# Patient Record
Sex: Male | Born: 1985
Health system: Southern US, Community
[De-identification: ages and names within clinical notes are randomized; demographics above are authoritative.]

## PROBLEM LIST (undated history)

## (undated) DIAGNOSIS — G4730 Sleep apnea, unspecified: Secondary | ICD-10-CM

## (undated) DIAGNOSIS — F419 Anxiety disorder, unspecified: Secondary | ICD-10-CM

## (undated) DIAGNOSIS — G8929 Other chronic pain: Secondary | ICD-10-CM

## (undated) DIAGNOSIS — M1990 Unspecified osteoarthritis, unspecified site: Secondary | ICD-10-CM

## (undated) DIAGNOSIS — T4145XA Adverse effect of unspecified anesthetic, initial encounter: Secondary | ICD-10-CM

## (undated) DIAGNOSIS — R519 Headache, unspecified: Secondary | ICD-10-CM

## (undated) DIAGNOSIS — K219 Gastro-esophageal reflux disease without esophagitis: Secondary | ICD-10-CM

## (undated) DIAGNOSIS — E291 Testicular hypofunction: Secondary | ICD-10-CM

## (undated) DIAGNOSIS — F329 Major depressive disorder, single episode, unspecified: Secondary | ICD-10-CM

## (undated) DIAGNOSIS — M5412 Radiculopathy, cervical region: Secondary | ICD-10-CM

## (undated) DIAGNOSIS — T8859XA Other complications of anesthesia, initial encounter: Secondary | ICD-10-CM

## (undated) HISTORY — DX: Other chronic pain: G89.29

## (undated) HISTORY — PX: CARPAL TUNNEL RELEASE: SHX101

## (undated) HISTORY — PX: SURGERY SCROTAL / TESTICULAR: SUR1316

## (undated) HISTORY — DX: Major depressive disorder, single episode, unspecified: F32.9

## (undated) HISTORY — DX: Testicular hypofunction: E29.1

## (undated) HISTORY — PX: LATERAL EPICONDYLE RELEASE: SHX1958

## (undated) HISTORY — DX: Radiculopathy, cervical region: M54.12

## (undated) HISTORY — DX: Anxiety disorder, unspecified: F41.9

## (undated) HISTORY — DX: Headache, unspecified: R51.9

## (undated) HISTORY — DX: Gastro-esophageal reflux disease without esophagitis: K21.9

---

## 2015-12-29 ENCOUNTER — Ambulatory Visit (INDEPENDENT_AMBULATORY_CARE_PROVIDER_SITE_OTHER): Payer: BLUE CROSS/BLUE SHIELD

## 2015-12-29 ENCOUNTER — Ambulatory Visit (INDEPENDENT_AMBULATORY_CARE_PROVIDER_SITE_OTHER): Payer: BLUE CROSS/BLUE SHIELD | Admitting: Sports Medicine

## 2015-12-29 VITALS — BP 104/68 | HR 58 | Resp 16 | Wt 199.0 lb

## 2015-12-29 DIAGNOSIS — M79645 Pain in left finger(s): Secondary | ICD-10-CM

## 2015-12-29 DIAGNOSIS — M79671 Pain in right foot: Secondary | ICD-10-CM

## 2015-12-29 DIAGNOSIS — Z0000 Encounter for general adult medical examination without abnormal findings: Secondary | ICD-10-CM

## 2015-12-29 DIAGNOSIS — K219 Gastro-esophageal reflux disease without esophagitis: Secondary | ICD-10-CM | POA: Diagnosis not present

## 2015-12-29 DIAGNOSIS — M1990 Unspecified osteoarthritis, unspecified site: Secondary | ICD-10-CM | POA: Diagnosis not present

## 2015-12-29 DIAGNOSIS — M19079 Primary osteoarthritis, unspecified ankle and foot: Secondary | ICD-10-CM | POA: Insufficient documentation

## 2015-12-29 HISTORY — DX: Gastro-esophageal reflux disease without esophagitis: K21.9

## 2015-12-29 LAB — TESTOSTERONE: Testosterone: 246 ng/dL — ABNORMAL LOW (ref 250–827)

## 2015-12-29 LAB — CBC
HCT: 40.9 % (ref 38.5–50.0)
Hemoglobin: 14.2 g/dL (ref 13.2–17.1)
MCH: 30.8 pg (ref 27.0–33.0)
MCHC: 34.7 g/dL (ref 32.0–36.0)
MCV: 88.7 fL (ref 80.0–100.0)
MPV: 9.9 fL (ref 7.5–12.5)
Platelets: 262 10*3/uL (ref 140–400)
RBC: 4.61 MIL/uL (ref 4.20–5.80)
RDW: 13.7 % (ref 11.0–15.0)
WBC: 7.7 10*3/uL (ref 3.8–10.8)

## 2015-12-29 LAB — COMPREHENSIVE METABOLIC PANEL
ALT: 16 U/L (ref 9–46)
AST: 18 U/L (ref 10–40)
Albumin: 4.3 g/dL (ref 3.6–5.1)
Alkaline Phosphatase: 74 U/L (ref 40–115)
BUN: 18 mg/dL (ref 7–25)
CO2: 25 mmol/L (ref 20–31)
Calcium: 9 mg/dL (ref 8.6–10.3)
Chloride: 103 mmol/L (ref 98–110)
Creat: 1.21 mg/dL (ref 0.60–1.35)
Glucose, Bld: 74 mg/dL (ref 65–99)
Potassium: 4.4 mmol/L (ref 3.5–5.3)
Sodium: 139 mmol/L (ref 135–146)
Total Bilirubin: 0.4 mg/dL (ref 0.2–1.2)
Total Protein: 6.6 g/dL (ref 6.1–8.1)

## 2015-12-29 LAB — HEMOGLOBIN A1C
Hgb A1c MFr Bld: 5.3 % (ref <5.7)
Mean Plasma Glucose: 105 mg/dL

## 2015-12-29 LAB — LIPID PANEL
Cholesterol: 150 mg/dL (ref 125–200)
HDL: 36 mg/dL — ABNORMAL LOW (ref >=40)
LDL Cholesterol: 89 mg/dL (ref <130)
Total CHOL/HDL Ratio: 4.2 Ratio (ref <=5.0)
Triglycerides: 123 mg/dL (ref <150)
VLDL: 25 mg/dL (ref <30)

## 2015-12-29 LAB — TSH: TSH: 2.91 mIU/L (ref 0.40–4.50)

## 2015-12-29 MED ORDER — MELOXICAM 15 MG PO TABS: ORAL_TABLET | ORAL | Status: DC

## 2015-12-29 MED ORDER — PANTOPRAZOLE SODIUM 40 MG PO TBEC: 40.0000 mg | DELAYED_RELEASE_TABLET | Freq: Every day | ORAL | Status: DC

## 2015-12-29 MED ORDER — DICLOFENAC SODIUM 75 MG PO TBEC: 75.0000 mg | DELAYED_RELEASE_TABLET | Freq: Two times a day (BID) | ORAL | Status: DC
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
## 2015-12-29 NOTE — Assessment & Plan Note (Signed)
Checking routine blood work including testosterone

## 2015-12-29 NOTE — Assessment & Plan Note (Signed)
After a fracture decades ago, likely represents early OA. Adding meloxicam, x-rays, return to see me in 2 weeks, we will consider orthotics and injection if no better.
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
## 2015-12-29 NOTE — Assessment & Plan Note (Signed)
Adding pantoprazole, as previously on omeprazole.

## 2015-12-30 LAB — VITAMIN D 25 HYDROXY (VIT D DEFICIENCY, FRACTURES): Vit D, 25-Hydroxy: 35 ng/mL (ref 30–100)

## 2016-01-04 ENCOUNTER — Other Ambulatory Visit: Payer: Self-pay | Admitting: Sports Medicine

## 2016-01-04 DIAGNOSIS — R7989 Other specified abnormal findings of blood chemistry: Secondary | ICD-10-CM

## 2016-01-08 DIAGNOSIS — E291 Testicular hypofunction: Secondary | ICD-10-CM | POA: Diagnosis not present

## 2016-01-10 LAB — TESTOSTERONE TOTAL,FREE,BIO, MALES
Albumin: 4.9 g/dL (ref 3.6–5.1)
Sex Hormone Binding: 21 nmol/L (ref 10–50)
Testosterone, Bioavailable: 154 ng/dL (ref 130.5–681.7)
Testosterone, Free: 69.1 pg/mL (ref 47.0–244.0)
Testosterone: 388 ng/dL (ref 250–827)

## 2016-01-12 ENCOUNTER — Ambulatory Visit (INDEPENDENT_AMBULATORY_CARE_PROVIDER_SITE_OTHER): Payer: BLUE CROSS/BLUE SHIELD | Admitting: Sports Medicine

## 2016-01-12 VITALS — BP 111/68 | HR 65 | Resp 18 | Wt 196.3 lb

## 2016-01-12 DIAGNOSIS — M1990 Unspecified osteoarthritis, unspecified site: Secondary | ICD-10-CM

## 2016-01-12 DIAGNOSIS — M79645 Pain in left finger(s): Secondary | ICD-10-CM

## 2016-01-12 DIAGNOSIS — K219 Gastro-esophageal reflux disease without esophagitis: Secondary | ICD-10-CM

## 2016-01-12 DIAGNOSIS — E291 Testicular hypofunction: Secondary | ICD-10-CM | POA: Diagnosis not present

## 2016-01-12 DIAGNOSIS — M19079 Primary osteoarthritis, unspecified ankle and foot: Secondary | ICD-10-CM

## 2016-01-12 HISTORY — DX: Testicular hypofunction: E29.1

## 2016-01-12 MED ORDER — TESTOSTERONE CYPIONATE 200 MG/ML IM SOLN
200.0000 mg | Freq: Once | INTRAMUSCULAR | Status: AC
  Administered 2016-01-12: 200 mg via INTRAMUSCULAR

## 2016-01-12 MED ORDER — NAPROXEN-ESOMEPRAZOLE 500-20 MG PO TBEC: 1.0000 | DELAYED_RELEASE_TABLET | Freq: Two times a day (BID) | ORAL | Status: DC

## 2016-01-12 NOTE — Assessment & Plan Note (Signed)
Starting testosterone injections, risks, benefits, alternatives explained. He will come in for his second injection in 2 weeks, and then we will recheck testosterone levels 1 week after that, if symptoms have improved we will continue, if not, we will discontinue.

## 2016-01-12 NOTE — Assessment & Plan Note (Signed)
Improved we will probably see a better improvement with Vimovo

## 2016-01-12 NOTE — Progress Notes (Signed)
  Subjective:    CC: Follow-up  HPI: Hypogonadism: Desires to start testosterone supplementation, does not desire to have anymore children.  Left thumb pain: Likely a sprain, did well with a thumb spica brace. Still has a bit of pain.  Right first MTP arthritis: Improved with anti-inflammatories, unfortunately had to stop due to gastritis.  Dyspepsia/gastritis: Did not get as good a response with pantoprazole as he did with omeprazole.  Past medical history, Surgical history, Family history not pertinant except as noted below, Social history, Allergies, and medications have been entered into the medical record, reviewed, and no changes needed.   Review of Systems: No fevers, chills, night sweats, weight loss, chest pain, or shortness of breath.   Objective:    General: Well Developed, well nourished, and in no acute distress.  Neuro: Alert and oriented x3, extra-ocular muscles intact, sensation grossly intact.  HEENT: Normocephalic, atraumatic, pupils equal round reactive to light, neck supple, no masses, no lymphadenopathy, thyroid nonpalpable.  Skin: Warm and dry, no rashes. Cardiac: Regular rate and rhythm, no murmurs rubs or gallops, no lower extremity edema.  Respiratory: Clear to auscultation bilaterally. Not using accessory muscles, speaking in full sentences.  Impression and Recommendations:   I spent 25 minutes with this patient, greater than 50% was face-to-face time counseling regarding the above diagnoses

## 2016-01-12 NOTE — Addendum Note (Signed)
Addended by: Baird KayUGLAS, Takeyah Wieman M on: 01/12/2016 11:32 AM   Modules accepted: Orders

## 2016-01-12 NOTE — Patient Instructions (Signed)
Needs to have labs checked 1 week after second testosterone injection

## 2016-01-12 NOTE — Assessment & Plan Note (Signed)
Continues to improve, wear the brace for another 2 weeks and then discontinue.

## 2016-01-12 NOTE — Assessment & Plan Note (Signed)
Better response with omeprazole, did not do as well with protonix. He will get an acid blocker in his combination oral anti-inflammatory.  Also getting some dyspepsia with meloxicam.

## 2016-01-26 ENCOUNTER — Ambulatory Visit (INDEPENDENT_AMBULATORY_CARE_PROVIDER_SITE_OTHER): Payer: BLUE CROSS/BLUE SHIELD | Admitting: Sports Medicine

## 2016-01-26 VITALS — BP 113/71 | HR 74 | Wt 201.0 lb

## 2016-01-26 DIAGNOSIS — E291 Testicular hypofunction: Secondary | ICD-10-CM

## 2016-01-26 MED ORDER — TESTOSTERONE CYPIONATE 200 MG/ML IM SOLN
200.0000 mg | Freq: Once | INTRAMUSCULAR | Status: AC
Start: 2016-01-26 — End: 2016-01-26
  Administered 2016-01-26: 200 mg via INTRAMUSCULAR

## 2016-01-26 NOTE — Progress Notes (Signed)
Patient came into office today for testosterone injection. Denies chest pain, shortness of breath, headaches and problems associated with taking this medication. Patient states he has had no abnornal mood swings. Patient tolerated injection in ROUQ well without complications. Patient advised to schedule his next injection for 2 weeks from today. 

## 2016-02-05 DIAGNOSIS — E291 Testicular hypofunction: Secondary | ICD-10-CM | POA: Diagnosis not present

## 2016-02-06 LAB — CBC
HCT: 42.9 % (ref 38.5–50.0)
Hemoglobin: 14.2 g/dL (ref 13.2–17.1)
MCH: 30.1 pg (ref 27.0–33.0)
MCHC: 33.1 g/dL (ref 32.0–36.0)
MCV: 90.9 fL (ref 80.0–100.0)
MPV: 10.1 fL (ref 7.5–12.5)
Platelets: 255 10*3/uL (ref 140–400)
RBC: 4.72 MIL/uL (ref 4.20–5.80)
RDW: 13.6 % (ref 11.0–15.0)
WBC: 7.5 10*3/uL (ref 3.8–10.8)

## 2016-02-06 LAB — PSA, TOTAL AND FREE
PSA, Free Pct: 51 % (ref >25)
PSA, Free: 0.38 ng/mL
PSA: 0.74 ng/mL (ref <=4.00)

## 2016-02-06 LAB — TESTOSTERONE: Testosterone: 258 ng/dL (ref 250–827)

## 2016-02-06 NOTE — Addendum Note (Signed)
Addended by: Monica Becton on: 02/06/2016 12:08 PM   Modules accepted: Orders

## 2016-02-09 ENCOUNTER — Other Ambulatory Visit: Payer: Self-pay | Admitting: *Deleted

## 2016-02-09 ENCOUNTER — Ambulatory Visit (INDEPENDENT_AMBULATORY_CARE_PROVIDER_SITE_OTHER): Payer: BLUE CROSS/BLUE SHIELD | Admitting: Sports Medicine

## 2016-02-09 VITALS — BP 113/59 | HR 60 | Wt 197.0 lb

## 2016-02-09 DIAGNOSIS — E291 Testicular hypofunction: Secondary | ICD-10-CM

## 2016-02-09 MED ORDER — TESTOSTERONE CYPIONATE 200 MG/ML IM SOLN
200.0000 mg | INTRAMUSCULAR | Status: DC
  Administered 2016-02-09: 200 mg via INTRAMUSCULAR

## 2016-02-09 NOTE — Progress Notes (Signed)
Patient is here for testosterone injection. Patient denies no headache, blurred vision,mood changes. Patient does reports feeling more energetic.He pleased with response that he is having to the testosterone injections. Patient is aware to go to lab one week from today. Re entered labs so that bar code will be on the order and gave to the patient.

## 2016-02-16 DIAGNOSIS — E291 Testicular hypofunction: Secondary | ICD-10-CM | POA: Diagnosis not present

## 2016-02-19 LAB — TESTOSTERONE TOTAL,FREE,BIO, MALES
Albumin: 4.2 g/dL (ref 3.6–5.1)
Sex Hormone Binding: 18 nmol/L (ref 10–50)
Testosterone, Bioavailable: 227.4 ng/dL (ref 130.5–681.7)
Testosterone, Free: 118.1 pg/mL (ref 47.0–244.0)
Testosterone: 529 ng/dL (ref 250–827)

## 2016-02-20 MED ORDER — OMEPRAZOLE 40 MG PO CPDR: 40.0000 mg | DELAYED_RELEASE_CAPSULE | Freq: Every day | ORAL | 3 refills | Status: DC

## 2016-02-20 MED ORDER — TESTOSTERONE CYPIONATE 200 MG/ML IM SOLN: 200.0000 mg | INTRAMUSCULAR | 0 refills | Status: DC

## 2016-02-20 NOTE — Addendum Note (Signed)
Addended by: Monica BectonHEKKEKANDAM, Chelsey Redondo J on: 02/20/2016 04:44 PM   Modules accepted: Orders

## 2016-02-22 ENCOUNTER — Telehealth: Payer: Self-pay | Admitting: *Deleted

## 2016-02-22 NOTE — Telephone Encounter (Signed)
Form completed and faxed to [employer].

## 2016-02-22 NOTE — Telephone Encounter (Signed)
Tried to initiate PA through covermy meds, unable to upload lab results, ins co. Is faxing forms to the office

## 2016-02-23 ENCOUNTER — Ambulatory Visit (INDEPENDENT_AMBULATORY_CARE_PROVIDER_SITE_OTHER): Payer: BLUE CROSS/BLUE SHIELD | Admitting: Sports Medicine

## 2016-02-23 VITALS — BP 126/66 | HR 76 | Wt 201.0 lb

## 2016-02-23 DIAGNOSIS — E291 Testicular hypofunction: Secondary | ICD-10-CM | POA: Diagnosis not present

## 2016-02-23 MED ORDER — TESTOSTERONE CYPIONATE 200 MG/ML IM SOLN
200.0000 mg | Freq: Once | INTRAMUSCULAR | Status: AC
  Administered 2016-02-23: 200 mg via INTRAMUSCULAR

## 2016-02-23 NOTE — Progress Notes (Signed)
   Subjective:    Patient ID: Shaun Camacho, male    DOB: 1985/11/22, 30 y.o.   MRN: [id number]  HPI  Shaun Camacho is here for a testosterone injection. Denies chest pain, shortness of breath, headaches or mood changes.    Review of Systems     Objective:   Physical Exam        Assessment & Plan:  Patient tolerated injection well without complications. Patient advised to schedule next injection 14 days from today.

## 2016-02-28 NOTE — Telephone Encounter (Signed)
PA approved for Depo-testosterone from 02/22/2016 through 07/07/2038.  Pt and pharm aware

## 2016-03-14 ENCOUNTER — Telehealth: Payer: Self-pay

## 2016-03-14 NOTE — Telephone Encounter (Signed)
Pt called c/o of numbness in his fingers, as well as swelling in hands, wrists and ankles. Pt states he started noticing these symptoms when he started his testosterone and would like to know if that could be the cause. Please advise.

## 2016-03-14 NOTE — Telephone Encounter (Signed)
It is not the testosterone.

## 2016-03-15 NOTE — Telephone Encounter (Signed)
Pt notified will make an appointment to have symptoms checked.

## 2016-03-22 ENCOUNTER — Ambulatory Visit (INDEPENDENT_AMBULATORY_CARE_PROVIDER_SITE_OTHER): Payer: BLUE CROSS/BLUE SHIELD | Admitting: Sports Medicine

## 2016-03-22 DIAGNOSIS — R200 Anesthesia of skin: Secondary | ICD-10-CM

## 2016-03-22 DIAGNOSIS — R208 Other disturbances of skin sensation: Secondary | ICD-10-CM

## 2016-03-22 DIAGNOSIS — Z981 Arthrodesis status: Secondary | ICD-10-CM | POA: Insufficient documentation

## 2016-03-22 DIAGNOSIS — M5412 Radiculopathy, cervical region: Secondary | ICD-10-CM

## 2016-03-22 DIAGNOSIS — M25512 Pain in left shoulder: Secondary | ICD-10-CM

## 2016-03-22 HISTORY — DX: Radiculopathy, cervical region: M54.12

## 2016-03-22 NOTE — Assessment & Plan Note (Signed)
Mild rotator cuff impingement type symptoms. Rehabilitation exercises and exercise band given, return in one month.

## 2016-03-22 NOTE — Progress Notes (Signed)
Pt supplied testosterone for 2 week injection. Injection placed in RUOQ. Pt tolerated well no c/o headaches, nausea or dizziness.

## 2016-03-22 NOTE — Assessment & Plan Note (Signed)
Suspect carpal tunnel syndrome. Bilateral nighttime splinting. Return to see me in one month, hydrodissection if no better.

## 2016-03-22 NOTE — Progress Notes (Signed)
  Subjective:    CC: Hand numbness  HPI: For several weeks this pleasant 30 year old male has had increasing sensation of fullness and numbness in both hands, across the fingertips, worse at night and in the mornings. Moderate, persistent. No changes in medications, no trauma. He also had the subjective sensation of numbness and tingling in this feet as well as swelling. Recent hemoglobin A1c was normal. No orthopnea, chest pain, no PND.  He also complains of pain in his left shoulder popping while working out.  Past medical history, Surgical history, Family history not pertinant except as noted below, Social history, Allergies, and medications have been entered into the medical record, reviewed, and no changes needed.   Review of Systems: No fevers, chills, night sweats, weight loss, chest pain, or shortness of breath.   Objective:    General: Well Developed, well nourished, and in no acute distress.  Neuro: Alert and oriented x3, extra-ocular muscles intact, sensation grossly intact.  HEENT: Normocephalic, atraumatic, pupils equal round reactive to light, neck supple, no masses, no lymphadenopathy, thyroid nonpalpable.  Skin: Warm and dry, no rashes. Cardiac: Regular rate and rhythm, no murmurs rubs or gallops, no lower extremity edema.  Respiratory: Clear to auscultation bilaterally. Not using accessory muscles, speaking in full sentences. Bilateral Wrist: Inspection normal with no visible erythema or swelling. ROM smooth and normal with good flexion and extension and ulnar/radial deviation that is symmetrical with opposite wrist. Palpation is normal over metacarpals, navicular, lunate, and TFCC; tendons without tenderness/ swelling No snuffbox tenderness. No tenderness over Canal of Guyon. Strength 5/5 in all directions without pain. Negative Finkelstein, Phalen sign, positive Tinel's sign bilaterally Negative Watson's test. Left Shoulder: Inspection reveals no abnormalities,  atrophy or asymmetry. Palpation is normal with no tenderness over AC joint or bicipital groove. ROM is full in all planes. Rotator cuff strength normal throughout. Mildly positive Neer and Hawkin's tests, empty can. Speeds and Yergason's tests normal. No labral pathology noted with negative Obrien's, negative crank, negative clunk, and good stability. Normal scapular function observed. No painful arc and no drop arm sign. No apprehension sign  Impression and Recommendations:    Bilateral hand numbness Suspect carpal tunnel syndrome. Bilateral nighttime splinting. Return to see me in one month, hydrodissection if no better.  Left shoulder pain Mild rotator cuff impingement type symptoms. Rehabilitation exercises and exercise band given, return in one month.

## 2016-04-19 ENCOUNTER — Ambulatory Visit: Payer: BLUE CROSS/BLUE SHIELD | Admitting: Sports Medicine

## 2016-06-26 ENCOUNTER — Ambulatory Visit (INDEPENDENT_AMBULATORY_CARE_PROVIDER_SITE_OTHER): Payer: BLUE CROSS/BLUE SHIELD | Admitting: Family Medicine

## 2016-06-26 ENCOUNTER — Encounter: Payer: Self-pay | Admitting: Family Medicine

## 2016-06-26 VITALS — BP 123/64 | HR 58 | Temp 98.1°F

## 2016-06-26 DIAGNOSIS — L0231 Cutaneous abscess of buttock: Secondary | ICD-10-CM | POA: Diagnosis not present

## 2016-06-26 NOTE — Patient Instructions (Signed)
Thank you for coming in today. Return as needed.    Incision and Drainage, Care After Refer to this sheet in the next few weeks. These instructions provide you with information about caring for yourself after your procedure. Your health care provider may also give you more specific instructions. Your treatment has been planned according to current medical practices, but problems sometimes occur. Call your health care provider if you have any problems or questions after your procedure. What can I expect after the procedure? After the procedure, it is common to have:  Pain or discomfort around your incision site.  Drainage from your incision. Follow these instructions at home:  Take over-the-counter and prescription medicines only as told by your health care provider.  If you were prescribed an antibiotic medicine, take it as told by your health care provider.Do not stop taking the antibiotic even if you start to feel better.  Followinstructions from your health care provider about:  How to take care of your incision.  When and how you should change your packing and bandage (dressing). Wash your hands with soap and water before you change your dressing. If soap and water are not available, use hand sanitizer.  When you should remove your dressing.  Do not take baths, swim, or use a hot tub until your health care provider approves.  Keep all follow-up visits as told by your health care provider. This is important.  Check your incision area every day for signs of infection. Check for:  More redness, swelling, or pain.  More fluid or blood.  Warmth.  Pus or a bad smell. Contact a health care provider if:  Your cyst or abscess returns.  You have a fever.  You have more redness, swelling, or pain around your incision.  You have more fluid or blood coming from your incision.  Your incision feels warm to the touch.  You have pus or a bad smell coming from your  incision. Get help right away if:  You have severe pain or bleeding.  You cannot eat or drink without vomiting.  You have decreased urine output.  You become short of breath.  You have chest pain.  You cough up blood.  The area where the incision and drainage occurred becomes numb or it tingles. This information is not intended to replace advice given to you by your health care provider. Make sure you discuss any questions you have with your health care provider. Document Released: 09/16/2011 Document Revised: 11/24/2015 Document Reviewed: 04/14/2015 Elsevier Interactive Patient Education  2017 [employer]

## 2016-06-26 NOTE — Progress Notes (Signed)
       Shaun Camacho is a 30 y.o. male who [facility] today for abscess right buttocks. Symptoms present for about a week. Patient has had history of recurrent abscesses in this area and has been draining at home with a needle. He notes he continues to become red painful swollen and occasionally oozes pus. He denies any fevers or chills nausea vomiting or diarrhea.   No past medical history on file. Past Surgical History:  Procedure Laterality Date  . SURGERY SCROTAL / TESTICULAR     Social History  Substance Use Topics  . Smoking status: Current Every Day Smoker    Packs/day: 0.50    Years: 18.00  . Smokeless tobacco: Current User    Types: Chew  . Alcohol use Not on file     Comment: rare   family history includes Diabetes in his maternal grandmother; Thyroid disease in his father and sister.  ROS as above:  Medications: Current Outpatient Prescriptions  Medication Sig Dispense Refill  . Naproxen-Esomeprazole 500-20 MG TBEC Take 1 tablet by mouth 2 (two) times daily. 60 tablet 3  . omeprazole (PRILOSEC) 40 MG capsule Take 1 capsule (40 mg total) by mouth daily. 90 capsule 3  . testosterone cypionate (DEPOTESTOSTERONE CYPIONATE) 200 MG/ML injection Inject 1 mL (200 mg total) into the muscle every 14 (fourteen) days. Please include 22 and 18 G needles as well as syringes 10 mL 0   Current Facility-Administered Medications  Medication Dose Route Frequency Provider Last Rate Last Dose  . testosterone cypionate (DEPOTESTOSTERONE CYPIONATE) injection 200 mg  200 mg Intramuscular Q14 Days Monica Bectonhomas J Thekkekandam, MD   200 mg at 02/09/16 1036   Allergies  Allergen Reactions  . Tramadol Palpitations  . Meloxicam Other (See Comments)    Somnolence    Health Maintenance Health Maintenance  Topic Date Due  . HIV Screening  10/21/1998  . TETANUS/TDAP   10/21/2002  . INFLUENZA VACCINE  02/06/2016     Exam:  BP 123/64   Pulse (!) 58   Temp 98.1 F (36.7 C) (Oral)  Gen: Well NAD Skin: Right buttocks: Small erythematous area approximately 2 cm across at the right buttocks near the gluteal cleft. The abscess is not located near the anus. The central portion of the abscess is fluctuant in the surrounding area is erythematous and indurated and tender. No pus is expressible.  Abscess incision and drainage. Consent obtained and timeout performed. Skin cleaned with alcohol, and cold spray applied. 2 mL of lidocaine  injected achieving good anesthesia. Skin was again cleaned with alcohol. A sharp incision was made to the area of fluctuance. The incision was widened and pus was expressed. Blunt dissection was used to break up loculations. Further pus was expressed. Patient tolerated the procedure well. A dressing was applied   No results found for this or any previous visit (from the past 72 hour(s)). No results found.    Assessment and Plan: 30 y.o. male with abscess buttocks. This is not located near the pilonidal area. Incised and drained today. Return as needed.   No orders of the defined types were placed in this encounter.   Discussed warning signs or symptoms. Please see discharge instructions. Patient expresses understanding.

## 2016-07-21 ENCOUNTER — Other Ambulatory Visit: Payer: Self-pay | Admitting: Sports Medicine

## 2016-07-21 DIAGNOSIS — E291 Testicular hypofunction: Secondary | ICD-10-CM

## 2016-07-31 DIAGNOSIS — F4323 Adjustment disorder with mixed anxiety and depressed mood: Secondary | ICD-10-CM | POA: Diagnosis not present

## 2016-08-09 ENCOUNTER — Ambulatory Visit: Payer: BLUE CROSS/BLUE SHIELD | Admitting: Sports Medicine

## 2016-08-15 DIAGNOSIS — F411 Generalized anxiety disorder: Secondary | ICD-10-CM | POA: Diagnosis not present

## 2016-08-22 DIAGNOSIS — M25521 Pain in right elbow: Secondary | ICD-10-CM | POA: Diagnosis not present

## 2016-08-23 ENCOUNTER — Ambulatory Visit (INDEPENDENT_AMBULATORY_CARE_PROVIDER_SITE_OTHER): Payer: BLUE CROSS/BLUE SHIELD | Admitting: Sports Medicine

## 2016-08-23 ENCOUNTER — Encounter: Payer: Self-pay | Admitting: Sports Medicine

## 2016-08-23 DIAGNOSIS — M7711 Lateral epicondylitis, right elbow: Secondary | ICD-10-CM | POA: Insufficient documentation

## 2016-08-23 DIAGNOSIS — F32A Depression, unspecified: Secondary | ICD-10-CM | POA: Insufficient documentation

## 2016-08-23 DIAGNOSIS — F418 Other specified anxiety disorders: Secondary | ICD-10-CM | POA: Diagnosis not present

## 2016-08-23 DIAGNOSIS — F419 Anxiety disorder, unspecified: Secondary | ICD-10-CM

## 2016-08-23 DIAGNOSIS — F329 Major depressive disorder, single episode, unspecified: Secondary | ICD-10-CM | POA: Insufficient documentation

## 2016-08-23 HISTORY — DX: Depression, unspecified: F32.A

## 2016-08-23 HISTORY — DX: Anxiety disorder, unspecified: F41.9

## 2016-08-23 MED ORDER — DICLOFENAC SODIUM 75 MG PO TBEC: 75.0000 mg | DELAYED_RELEASE_TABLET | Freq: Two times a day (BID) | ORAL | 3 refills | Status: DC

## 2016-08-23 MED ORDER — ESCITALOPRAM OXALATE 5 MG PO TABS: 5.0000 mg | ORAL_TABLET | Freq: Every day | ORAL | 3 refills | Status: DC

## 2016-08-23 NOTE — Assessment & Plan Note (Signed)
Continue strap, adding Voltaren, rehabilitation exercises. I would like baseline x-rays. Return in one month. Injection if no better.

## 2016-08-23 NOTE — Assessment & Plan Note (Signed)
Lexapro.  Return in 4 weeks for a PHQ9 and GAD7 again.
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
## 2016-08-28 ENCOUNTER — Telehealth: Payer: Self-pay

## 2016-08-28 NOTE — Telephone Encounter (Signed)
Pt left VM stating medication is causing him to have issues and you told him to call if this occurs. Would like to know what's the next option. Please advise.

## 2016-08-29 MED ORDER — VORTIOXETINE HBR 10 MG PO TABS: 1.0000 | ORAL_TABLET | Freq: Every day | ORAL | 3 refills | Status: DC

## 2016-08-29 NOTE — Telephone Encounter (Signed)
Okay, I will switch him to Trintellix.  Rx and discount coupon in box.

## 2016-08-29 NOTE — Telephone Encounter (Signed)
Pt.notified

## 2016-09-03 DIAGNOSIS — F411 Generalized anxiety disorder: Secondary | ICD-10-CM | POA: Diagnosis not present

## 2016-09-12 ENCOUNTER — Ambulatory Visit (INDEPENDENT_AMBULATORY_CARE_PROVIDER_SITE_OTHER): Payer: BLUE CROSS/BLUE SHIELD | Admitting: Sports Medicine

## 2016-09-12 ENCOUNTER — Encounter: Payer: Self-pay | Admitting: Sports Medicine

## 2016-09-12 DIAGNOSIS — F418 Other specified anxiety disorders: Secondary | ICD-10-CM

## 2016-09-12 DIAGNOSIS — F419 Anxiety disorder, unspecified: Secondary | ICD-10-CM

## 2016-09-12 DIAGNOSIS — F32A Depression, unspecified: Secondary | ICD-10-CM

## 2016-09-12 DIAGNOSIS — F329 Major depressive disorder, single episode, unspecified: Secondary | ICD-10-CM

## 2016-09-12 MED ORDER — VILAZODONE HCL 10 MG PO TABS: 10.0000 mg | ORAL_TABLET | Freq: Every day | ORAL | 3 refills | Status: DC

## 2016-09-12 NOTE — Progress Notes (Signed)
  Subjective:    CC: follow-up  HPI: Anxiety and depression: Good response to Trintellix however is developing erectile dysfunction unfortunately.  Lateral epicondylitis: Improving with rehabilitation exercises.  Past medical history:  Negative.  See flowsheet/record as well for more information.  Surgical history: Negative.  See flowsheet/record as well for more information.  Family history: Negative.  See flowsheet/record as well for more information.  Social history: Negative.  See flowsheet/record as well for more information.  Allergies, and medications have been entered into the medical record, reviewed, and no changes needed.   Review of Systems: No fevers, chills, night sweats, weight loss, chest pain, or shortness of breath.   Objective:    General: Well Developed, well nourished, and in no acute distress.  Neuro: Alert and oriented x3, extra-ocular muscles intact, sensation grossly intact.  HEENT: Normocephalic, atraumatic, pupils equal round reactive to light, neck supple, no masses, no lymphadenopathy, thyroid nonpalpable.  Skin: Warm and dry, no rashes. Cardiac: Regular rate and rhythm, no murmurs rubs or gallops, no lower extremity edema.  Respiratory: Clear to auscultation bilaterally. Not using accessory muscles, speaking in full sentences.  Impression and Recommendations:    Anxiety and depression Good improvement in symptoms however is getting sexual dysfunction with difficulty achieving erection, he has no problem achieving ejaculation. Switching to Viibryd.  I spent 25 minutes with this patient, greater than 50% was face-to-face time counseling regarding the above diagnoses

## 2016-09-12 NOTE — Assessment & Plan Note (Signed)
Good improvement in symptoms however is getting sexual dysfunction with difficulty achieving erection, he has no problem achieving ejaculation. Switching to Viibryd.

## 2016-09-20 ENCOUNTER — Ambulatory Visit: Payer: BLUE CROSS/BLUE SHIELD | Admitting: Sports Medicine

## 2016-09-25 ENCOUNTER — Telehealth: Payer: Self-pay

## 2016-09-25 MED ORDER — OSELTAMIVIR PHOSPHATE 75 MG PO CAPS: 75.0000 mg | ORAL_CAPSULE | Freq: Two times a day (BID) | ORAL | 0 refills | Status: DC

## 2016-09-25 NOTE — Telephone Encounter (Signed)
Pt left VM stating his wife and son recently had the flu and he's having symptoms. Would like to know if he can get tamiflu. Please advise.

## 2016-09-25 NOTE — Telephone Encounter (Signed)
Tamiflu called in

## 2016-10-01 DIAGNOSIS — F411 Generalized anxiety disorder: Secondary | ICD-10-CM | POA: Diagnosis not present

## 2016-10-10 ENCOUNTER — Ambulatory Visit: Payer: BLUE CROSS/BLUE SHIELD | Admitting: Sports Medicine

## 2016-10-10 DIAGNOSIS — F411 Generalized anxiety disorder: Secondary | ICD-10-CM | POA: Diagnosis not present

## 2016-10-11 ENCOUNTER — Ambulatory Visit (INDEPENDENT_AMBULATORY_CARE_PROVIDER_SITE_OTHER): Payer: BLUE CROSS/BLUE SHIELD | Admitting: Sports Medicine

## 2016-10-11 DIAGNOSIS — F419 Anxiety disorder, unspecified: Secondary | ICD-10-CM

## 2016-10-11 DIAGNOSIS — M7711 Lateral epicondylitis, right elbow: Secondary | ICD-10-CM | POA: Diagnosis not present

## 2016-10-11 DIAGNOSIS — F329 Major depressive disorder, single episode, unspecified: Secondary | ICD-10-CM

## 2016-10-11 DIAGNOSIS — F418 Other specified anxiety disorders: Secondary | ICD-10-CM

## 2016-10-11 DIAGNOSIS — F32A Depression, unspecified: Secondary | ICD-10-CM

## 2016-10-11 NOTE — Assessment & Plan Note (Signed)
Fantastic response to Viibryd, no sexual dysfunction in all symptoms have resolved. Return in 3 months.
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
## 2016-10-22 DIAGNOSIS — F411 Generalized anxiety disorder: Secondary | ICD-10-CM | POA: Diagnosis not present

## 2016-11-18 ENCOUNTER — Other Ambulatory Visit: Payer: Self-pay | Admitting: Sports Medicine

## 2016-11-21 DIAGNOSIS — F411 Generalized anxiety disorder: Secondary | ICD-10-CM | POA: Diagnosis not present

## 2016-12-05 DIAGNOSIS — F411 Generalized anxiety disorder: Secondary | ICD-10-CM | POA: Diagnosis not present

## 2016-12-11 ENCOUNTER — Other Ambulatory Visit: Payer: Self-pay | Admitting: Sports Medicine

## 2016-12-11 DIAGNOSIS — E291 Testicular hypofunction: Secondary | ICD-10-CM

## 2016-12-19 DIAGNOSIS — F411 Generalized anxiety disorder: Secondary | ICD-10-CM | POA: Diagnosis not present

## 2016-12-27 ENCOUNTER — Ambulatory Visit (INDEPENDENT_AMBULATORY_CARE_PROVIDER_SITE_OTHER): Payer: BLUE CROSS/BLUE SHIELD | Admitting: Sports Medicine

## 2016-12-27 DIAGNOSIS — F329 Major depressive disorder, single episode, unspecified: Secondary | ICD-10-CM

## 2016-12-27 DIAGNOSIS — F419 Anxiety disorder, unspecified: Secondary | ICD-10-CM | POA: Diagnosis not present

## 2016-12-27 DIAGNOSIS — F32A Depression, unspecified: Secondary | ICD-10-CM

## 2016-12-27 MED ORDER — VILAZODONE HCL 20 MG PO TABS: 1.0000 | ORAL_TABLET | Freq: Every day | ORAL | 3 refills | Status: DC

## 2016-12-27 NOTE — Assessment & Plan Note (Signed)
Overall doing well on 10 mg of Viibryd. He did take a new job and is on [facility] [employer]. There is a lot more time he with this job, and this is brought on a new degree of anxiety, we are going to increase Viibryd to 20 mg for a couple of months until things level out and then drop back down to 10 mg.
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
## 2017-01-16 DIAGNOSIS — F411 Generalized anxiety disorder: Secondary | ICD-10-CM | POA: Diagnosis not present

## 2017-01-24 ENCOUNTER — Ambulatory Visit (INDEPENDENT_AMBULATORY_CARE_PROVIDER_SITE_OTHER): Payer: BLUE CROSS/BLUE SHIELD | Admitting: Sports Medicine

## 2017-01-24 ENCOUNTER — Encounter: Payer: Self-pay | Admitting: Sports Medicine

## 2017-01-24 DIAGNOSIS — F329 Major depressive disorder, single episode, unspecified: Secondary | ICD-10-CM

## 2017-01-24 DIAGNOSIS — F32A Depression, unspecified: Secondary | ICD-10-CM

## 2017-01-24 DIAGNOSIS — F419 Anxiety disorder, unspecified: Secondary | ICD-10-CM | POA: Diagnosis not present

## 2017-01-24 MED ORDER — VILAZODONE HCL 10 MG PO TABS: 10.0000 mg | ORAL_TABLET | Freq: Every day | ORAL | 3 refills | Status: DC

## 2017-01-24 NOTE — Assessment & Plan Note (Signed)
Doing extremely well, decreasing Viibryd to 10 mg, he will seek behavioral therapy regarding his irritability, doesn't feel a whole lot better on 20 compared to when he was on 5, no adverse effects, happy with where things are, return in 3 months.
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
## 2017-02-04 DIAGNOSIS — F411 Generalized anxiety disorder: Secondary | ICD-10-CM | POA: Diagnosis not present

## 2017-02-07 ENCOUNTER — Other Ambulatory Visit: Payer: Self-pay | Admitting: Sports Medicine

## 2017-03-04 ENCOUNTER — Encounter: Payer: Self-pay | Admitting: Sports Medicine

## 2017-03-04 ENCOUNTER — Ambulatory Visit (INDEPENDENT_AMBULATORY_CARE_PROVIDER_SITE_OTHER): Payer: BLUE CROSS/BLUE SHIELD | Admitting: Sports Medicine

## 2017-03-04 DIAGNOSIS — F411 Generalized anxiety disorder: Secondary | ICD-10-CM | POA: Diagnosis not present

## 2017-03-04 DIAGNOSIS — M7711 Lateral epicondylitis, right elbow: Secondary | ICD-10-CM | POA: Diagnosis not present

## 2017-03-04 NOTE — Assessment & Plan Note (Signed)
Previous injection was 5 months ago, repeat right lateral epicondyle injection. There was a bit of hypoechoic change in the proximal tendon. Adding a counter force brace, and he will get back with his rehabilitation exercises. Return to see me as needed.

## 2017-03-04 NOTE — Progress Notes (Signed)
  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 He desires repeat in interventional treatment today. Pain is moderate, persistent, localized at the lateral elbow without radiation.  Past medical history:  Negative.  See flowsheet/record as well for more information.  Surgical history: Negative.  See flowsheet/record as well for more information.  Family history: Negative.  See flowsheet/record as well for more information.  Social history: Negative.  See flowsheet/record as well for more information.  Allergies, and medications have been entered into the medical record, reviewed, and no changes needed.   Review of Systems: No fevers, chills, night sweats, weight loss, chest pain, or shortness of breath.   Objective:    General: Well Developed, well nourished, and in no acute distress.  Neuro: Alert and oriented x3, extra-ocular muscles intact, sensation grossly intact.  HEENT: Normocephalic, atraumatic, pupils equal round reactive to light, neck supple, no masses, no lymphadenopathy, thyroid nonpalpable.  Skin: Warm and dry, no rashes. Cardiac: Regular rate and rhythm, no murmurs rubs or gallops, no lower extremity edema.  Respiratory: Clear to auscultation bilaterally. Not using accessory muscles, speaking in full sentences. Right Elbow: Unremarkable to inspection. Range of motion full pronation, supination, flexion, extension. Strength is full to all of the above directions Stable to varus, valgus stress. Negative moving valgus stress test. Tender palpation at the common extensor tendon origin Ulnar nerve does not sublux. Negative cubital tunnel Tinel's.  Procedure: Real-time Ultrasound Guided Injection of right common extensor tendon Device: GE Logiq E  Verbal  informed consent obtained.  Time-out conducted.  Noted no overlying erythema, induration, or other signs of local infection.  Skin prepped in a sterile fashion.  Local anesthesia: Topical Ethyl chloride.  With sterile technique and under real time ultrasound guidance:  1 mL kenalog 40, 1 mL lidocaine, 1 mL bupivacaine injected easily both superficial to and deep to the common extensor tendon. Completed without difficulty  Pain immediately resolved suggesting accurate placement of the medication.  Advised to call if fevers/chills, erythema, induration, drainage, or persistent bleeding.  Images permanently stored and available for review in the ultrasound unit.  Impression: Technically successful ultrasound guided injection.  Impression and Recommendations:    Lateral epicondylitis, right elbow Previous injection was 5 months ago, repeat right lateral epicondyle injection. There was a bit of hypoechoic change in the proximal tendon. Adding a counter force brace, and he will get back with his rehabilitation exercises. Return to see me as needed.

## 2017-04-10 DIAGNOSIS — F411 Generalized anxiety disorder: Secondary | ICD-10-CM | POA: Diagnosis not present

## 2017-04-14 ENCOUNTER — Other Ambulatory Visit: Payer: Self-pay | Admitting: Sports Medicine

## 2017-04-28 ENCOUNTER — Ambulatory Visit: Payer: BLUE CROSS/BLUE SHIELD | Admitting: Sports Medicine

## 2017-05-02 ENCOUNTER — Other Ambulatory Visit: Payer: Self-pay | Admitting: Sports Medicine

## 2017-05-02 DIAGNOSIS — E291 Testicular hypofunction: Secondary | ICD-10-CM

## 2017-05-09 ENCOUNTER — Ambulatory Visit: Payer: BLUE CROSS/BLUE SHIELD | Admitting: Sports Medicine

## 2017-05-27 ENCOUNTER — Ambulatory Visit: Payer: BLUE CROSS/BLUE SHIELD | Admitting: Family Medicine

## 2017-05-27 ENCOUNTER — Encounter: Payer: Self-pay | Admitting: Family Medicine

## 2017-05-27 VITALS — BP 133/79 | HR 57 | Temp 98.0°F | Resp 16 | Ht 70.0 in | Wt 215.0 lb

## 2017-05-27 DIAGNOSIS — J0111 Acute recurrent frontal sinusitis: Secondary | ICD-10-CM

## 2017-05-27 MED ORDER — CEFDINIR 300 MG PO CAPS: 300.0000 mg | ORAL_CAPSULE | Freq: Two times a day (BID) | ORAL | 0 refills | Status: DC

## 2017-05-27 MED ORDER — PREDNISONE 10 MG PO TABS: 30.0000 mg | ORAL_TABLET | Freq: Every day | ORAL | 0 refills | Status: DC

## 2017-05-27 NOTE — Progress Notes (Signed)
Shaun Camacho is a 31 y.o. male who [facility] today for sinus pain and pressure symptoms present for 3 weeks. Patient has frontal sinus pressure. He's had sinus infections in the past. He's been trying over-the-counter medications which have not helped. Hehad multiple nasal fractures and has recurrent sinus infections as a result.    History reviewed. No pertinent past medical history. Past Surgical History:  Procedure Laterality Date  . SURGERY SCROTAL / TESTICULAR     Social History   Tobacco Use  . Smoking status: Current Every Day Smoker    Packs/day: 0.50    Years: 18.00    Pack years: 9.00  . Smokeless tobacco: Current User    Types: Chew  Substance Use Topics  . Alcohol use: Not on file    Comment: rare   family history includes Diabetes in his maternal grandmother; Thyroid disease in his father and sister.  ROS as above:  Medications: Current Outpatient Medications  Medication Sig Dispense Refill  . B-D 3CC LUER-LOK SYR 22GX1-1/2 22G X 1-1/2" 3 ML MISC USE AS DIRECTED 10 each 0  . BD HYPODERMIC NEEDLE 18G X 1" MISC USE AS DIRECTED 10 each 0  . omeprazole (PRILOSEC) 40 MG capsule TAKE 1 CAPSULE BY MOUTH EVERY DAY 90 capsule 0  . testosterone cypionate (DEPOTESTOSTERONE CYPIONATE) 200 MG/ML injection INJECT 1ML INTO THE MUSCLE EVERY 14 DAYS 10 mL 0  . Vilazodone HCl (VIIBRYD) 10 MG TABS Take 1 tablet (10 mg total) by mouth daily. 30 tablet 3  . cefdinir (OMNICEF) 300 MG capsule Take 1 capsule (300 mg total) 2 (two) times daily by mouth. 14 capsule 0  . predniSONE (DELTASONE) 10 MG tablet Take 3 tablets (30 mg total) daily with breakfast by mouth. 15 tablet 0   No current facility-administered medications for this visit.    Allergies  Allergen Reactions  . Tramadol Palpitations  . Meloxicam Other (See Comments)    Somnolence     Health Maintenance Health Maintenance  Topic Date Due  . HIV Screening  10/21/1998  . TETANUS/TDAP  10/21/2002  . INFLUENZA VACCINE  02/05/2017     Exam:  BP 133/79   Pulse (!) 57   Temp 98 F (36.7 C) (Oral)   Resp 16   Ht 5\' 10"  (1.778 m)   Wt 215 lb (97.5 kg)   SpO2 98%   BMI 30.85 kg/m  Gen: Well NAD HEENT: EOMI,  MMM clear nasal discharge. Right nostril is more than the left however no obvious deviated septum present. Inflamed nasal turbinates bilaterally. Tender palpation bilateral frontal sinuses. No significant cervical lymphadenopathy present. Lungs: Normal work of breathing. CTABL Heart: RRR no MRG Abd: NABS, Soft. Nondistended, Nontender Exts: Brisk capillary refill, warm and well perfused.    No results found for this or any previous visit (from the past 72 hour(s)). No results found.    Assessment and Plan: 31 y.o. male with sinusitis recurrent. Plan to treat with Omnicef and prednisone. Recheck if not improved. If this becomes more recurrent a more regular recommend referral to ENT for potential surgical evaluation.   No orders of the defined types were placed in this encounter.  Meds ordered this encounter  Medications  . predniSONE (DELTASONE) 10 MG tablet    Sig: Take 3 tablets (30 mg total) daily with breakfast by mouth.    Dispense:  15 tablet    Refill:  0  .  cefdinir (OMNICEF) 300 MG capsule    Sig: Take 1 capsule (300 mg total) 2 (two) times daily by mouth.    Dispense:  14 capsule    Refill:  0     Discussed warning signs or symptoms. Please see discharge instructions. Patient expresses understanding.

## 2017-05-27 NOTE — Patient Instructions (Signed)
Thank you for coming in today. Take both prednisone and omncief You can continue to use over the counter medicine like dayquil and nyquil.  Recheck as needed.  Call or go to the emergency room if you get worse, have trouble breathing, have chest pains, or palpitations.    Sinusitis, Adult Sinusitis is soreness and inflammation of your sinuses. Sinuses are hollow spaces in the bones around your face. Your sinuses are located:  Around your eyes.  In the middle of your forehead.  Behind your nose.  In your cheekbones.  Your sinuses and nasal passages are lined with a stringy fluid (mucus). Mucus normally drains out of your sinuses. When your nasal tissues become inflamed or swollen, the mucus can become trapped or blocked so air cannot flow through your sinuses. This allows bacteria, viruses, and funguses to grow, which leads to infection. Sinusitis can develop quickly and last for 7?10 days (acute) or for more than 12 weeks (chronic). Sinusitis often develops after a cold. What are the causes? This condition is caused by anything that creates swelling in the sinuses or stops mucus from draining, including:  Allergies.  Asthma.  Bacterial or viral infection.  Abnormally shaped bones between the nasal passages.  Nasal growths that contain mucus (nasal polyps).  Narrow sinus openings.  Pollutants, such as chemicals or irritants in the air.  A foreign object stuck in the nose.  A fungal infection. This is rare.  What increases the risk? The following factors may make you more likely to develop this condition:  Having allergies or asthma.  Having had a recent cold or respiratory tract infection.  Having structural deformities or blockages in your nose or sinuses.  Having a weak immune system.  Doing a lot of swimming or diving.  Overusing nasal sprays.  Smoking.  What are the signs or symptoms? The main symptoms of this condition are pain and a feeling of pressure  around the affected sinuses. Other symptoms include:  Upper toothache.  Earache.  Headache.  Bad breath.  Decreased sense of smell and taste.  A cough that may get worse at night.  Fatigue.  Fever.  Thick drainage from your nose. The drainage is often green and it may contain pus (purulent).  Stuffy nose or congestion.  Postnasal drip. This is when extra mucus collects in the throat or back of the nose.  Swelling and warmth over the affected sinuses.  Sore throat.  Sensitivity to light.  How is this diagnosed? This condition is diagnosed based on symptoms, a medical history, and a physical exam. To find out if your condition is acute or chronic, your health care provider may:  Look in your nose for signs of nasal polyps.  Tap over the affected sinus to check for signs of infection.  View the inside of your sinuses using an imaging device that has a light attached (endoscope).  If your health care provider suspects that you have chronic sinusitis, you may also:  Be tested for allergies.  Have a sample of mucus taken from your nose (nasal culture) and checked for bacteria.  Have a mucus sample examined to see if your sinusitis is related to an allergy.  If your sinusitis does not respond to treatment and it lasts longer than 8 weeks, you may have an MRI or CT scan to check your sinuses. These scans also help to determine how severe your infection is. In rare cases, a bone biopsy may be done to rule out more serious  types of fungal sinus disease. How is this treated? Treatment for sinusitis depends on the cause and whether your condition is chronic or acute. If a virus is causing your sinusitis, your symptoms will go away on their own within 10 days. You may be given medicines to relieve your symptoms, including:  Topical nasal decongestants. They shrink swollen nasal passages and let mucus drain from your sinuses.  Antihistamines. These drugs block inflammation  that is triggered by allergies. This can help to ease swelling in your nose and sinuses.  Topical nasal corticosteroids. These are nasal sprays that ease inflammation and swelling in your nose and sinuses.  Nasal saline washes. These rinses can help to get rid of thick mucus in your nose.  If your condition is caused by bacteria, you will be given an antibiotic medicine. If your condition is caused by a fungus, you will be given an antifungal medicine. Surgery may be needed to correct underlying conditions, such as narrow nasal passages. Surgery may also be needed to remove polyps. Follow these instructions at home: Medicines  Take, use, or apply over-the-counter and prescription medicines only as told by your health care provider. These may include nasal sprays.  If you were prescribed an antibiotic medicine, take it as told by your health care provider. Do not stop taking the antibiotic even if you start to feel better. Hydrate and Humidify  Drink enough water to keep your urine clear or pale yellow. Staying hydrated will help to thin your mucus.  Use a cool mist humidifier to keep the humidity level in your home above 50%.  Inhale steam for 10-15 minutes, 3-4 times a day or as told by your health care provider. You can do this in the bathroom while a hot shower is running.  Limit your exposure to cool or dry air. Rest  Rest as much as possible.  Sleep with your head raised (elevated).  Make sure to get enough sleep each night. General instructions  Apply a warm, moist washcloth to your face 3-4 times a day or as told by your health care provider. This will help with discomfort.  Wash your hands often with soap and water to reduce your exposure to viruses and other germs. If soap and water are not available, use hand sanitizer.  Do not smoke. Avoid being around people who are smoking (secondhand smoke).  Keep all follow-up visits as told by your health care provider. This is  important. Contact a health care provider if:  You have a fever.  Your symptoms get worse.  Your symptoms do not improve within 10 days. Get help right away if:  You have a severe headache.  You have persistent vomiting.  You have pain or swelling around your face or eyes.  You have vision problems.  You develop confusion.  Your neck is stiff.  You have trouble breathing. This information is not intended to replace advice given to you by your health care provider. Make sure you discuss any questions you have with your health care provider. Document Released: 06/24/2005 Document Revised: 02/18/2016 Document Reviewed: 04/19/2015 Elsevier Interactive Patient Education  2017 [employer]

## 2017-06-18 ENCOUNTER — Other Ambulatory Visit: Payer: Self-pay | Admitting: Sports Medicine

## 2017-06-18 ENCOUNTER — Telehealth: Payer: Self-pay

## 2017-06-18 DIAGNOSIS — F329 Major depressive disorder, single episode, unspecified: Secondary | ICD-10-CM

## 2017-06-18 DIAGNOSIS — F32A Depression, unspecified: Secondary | ICD-10-CM

## 2017-06-18 DIAGNOSIS — F419 Anxiety disorder, unspecified: Secondary | ICD-10-CM

## 2017-06-18 MED ORDER — AZITHROMYCIN 250 MG PO TABS: ORAL_TABLET | ORAL | 0 refills | Status: DC

## 2017-06-18 MED ORDER — FLUTICASONE PROPIONATE 50 MCG/ACT NA SUSP: NASAL | 3 refills | Status: DC

## 2017-06-18 NOTE — Telephone Encounter (Signed)
No problem especially since he has already been seen for this by Dr. Denyse Amassorey.  Adding flonase and the 2nd line antibiotic Azithromycin.g

## 2017-06-18 NOTE — Telephone Encounter (Signed)
Pt would like to know if you would be willing to send him in something for his sinus infection. States it never went away and is not starting to get bad again. Please advise.

## 2017-06-19 NOTE — Telephone Encounter (Signed)
Pt.notified

## 2017-07-12 ENCOUNTER — Other Ambulatory Visit: Payer: Self-pay | Admitting: Sports Medicine

## 2017-07-12 DIAGNOSIS — F329 Major depressive disorder, single episode, unspecified: Secondary | ICD-10-CM

## 2017-07-12 DIAGNOSIS — F419 Anxiety disorder, unspecified: Secondary | ICD-10-CM

## 2017-07-12 DIAGNOSIS — F32A Depression, unspecified: Secondary | ICD-10-CM

## 2017-07-14 ENCOUNTER — Telehealth: Payer: Self-pay | Admitting: Sports Medicine

## 2017-07-14 NOTE — Telephone Encounter (Signed)
Thank you :)

## 2017-07-14 NOTE — Telephone Encounter (Signed)
Pt called. He has a pilonidal cyst and is in a lot of pain but no appointments were available at the time of his call. His appointment is scheduled for next Tuesday.

## 2017-07-14 NOTE — Telephone Encounter (Signed)
I have a 4:15 tomorrow and several openings on Thursday.  Just book him into one of those.

## 2017-07-14 NOTE — Telephone Encounter (Signed)
Had to put appt for 10 am tomorrow, the 4:15 was taken.

## 2017-07-15 ENCOUNTER — Encounter: Payer: Self-pay | Admitting: Sports Medicine

## 2017-07-15 ENCOUNTER — Ambulatory Visit: Payer: BLUE CROSS/BLUE SHIELD | Admitting: Sports Medicine

## 2017-07-15 DIAGNOSIS — L0231 Cutaneous abscess of buttock: Secondary | ICD-10-CM

## 2017-07-15 DIAGNOSIS — L988 Other specified disorders of the skin and subcutaneous tissue: Secondary | ICD-10-CM | POA: Insufficient documentation

## 2017-07-15 MED ORDER — DOXYCYCLINE HYCLATE 100 MG PO TABS: 100.0000 mg | ORAL_TABLET | Freq: Two times a day (BID) | ORAL | 0 refills | Status: AC

## 2017-07-15 MED ORDER — HYDROCODONE-ACETAMINOPHEN 5-325 MG PO TABS: 1.0000 | ORAL_TABLET | Freq: Three times a day (TID) | ORAL | 0 refills | Status: DC | PRN

## 2017-07-15 NOTE — Patient Instructions (Signed)
Incision and Drainage, Care After Refer to this sheet in the next few weeks. These instructions provide you with information about caring for yourself after your procedure. Your health care provider may also give you more specific instructions. Your treatment has been planned according to current medical practices, but problems sometimes occur. Call your health care provider if you have any problems or questions after your procedure. What can I expect after the procedure? After the procedure, it is common to have:  Pain or discomfort around your incision site.  Drainage from your incision.  Follow these instructions at home:  Take over-the-counter and prescription medicines only as told by your health care provider.  If you were prescribed an antibiotic medicine, take it as told by your health care provider.Do not stop taking the antibiotic even if you start to feel better.  Followinstructions from your health care provider about: ? How to take care of your incision. ? When and how you should change your packing and bandage (dressing). Wash your hands with soap and water before you change your dressing. If soap and water are not available, use hand sanitizer. ? When you should remove your dressing.  Do not take baths, swim, or use a hot tub until your health care provider approves.  Keep all follow-up visits as told by your health care provider. This is important.  Check your incision area every day for signs of infection. Check for: ? More redness, swelling, or pain. ? More fluid or blood. ? Warmth. ? Pus or a bad smell. Contact a health care provider if:  Your cyst or abscess returns.  You have a fever.  You have more redness, swelling, or pain around your incision.  You have more fluid or blood coming from your incision.  Your incision feels warm to the touch.  You have pus or a bad smell coming from your incision. Get help right away if:  You have severe pain or  bleeding.  You cannot eat or drink without vomiting.  You have decreased urine output.  You become short of breath.  You have chest pain.  You cough up blood.  The area where the incision and drainage occurred becomes numb or it tingles. This information is not intended to replace advice given to you by your health care provider. Make sure you discuss any questions you have with your health care provider. Document Released: 09/16/2011 Document Revised: 11/24/2015 Document Reviewed: 04/14/2015 Elsevier Interactive Patient Education  2018 Elsevier Inc.  

## 2017-07-15 NOTE — Assessment & Plan Note (Signed)
Incision and drainage with packing. Return in 1 week. Doxycycline, hydrocodone for pain, wound culture.

## 2017-07-15 NOTE — Progress Notes (Signed)
  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  This has happened in the past, and tended to drain.  Right localized without radiation.  No fevers or chills, no constitutional symptoms.  No GI symptoms.  I reviewed the past medical history, family history, social history, surgical history, and allergies today and no changes were needed.  Please see the problem list section below in epic for further details.  Past Medical History: No past medical history on file. Past Surgical History: Past Surgical History:  Procedure Laterality Date  . SURGERY SCROTAL / TESTICULAR     Social History: Social History   Socioeconomic History  . Marital status: Married    Spouse name: None  . Number of children: None  . Years of education: None  . Highest education level: None  Social Needs  . Financial resource strain: None  . Food insecurity - worry: None  . Food insecurity - inability: None  . Transportation needs - medical: None  . Transportation needs - non-medical: None  Occupational History  . None  Tobacco Use  . Smoking status: Current Every Day Smoker    Packs/day: 0.50    Years: 18.00    Pack years: 9.00  . Smokeless tobacco: Current User    Types: Chew  Substance and Sexual Activity  . Alcohol use: None    Comment: rare  . Drug use: No  . Sexual activity: None  Other Topics Concern  . None  Social History Narrative  . None   Family History: Family History  Problem Relation Age of Onset  . Thyroid disease Father   . Thyroid disease Sister   . Diabetes Maternal Grandmother    Allergies: Allergies  Allergen Reactions  . Tramadol Palpitations  . Meloxicam Other (See Comments)    Somnolence   Medications: See med rec.  Review of Systems: No fevers, chills, night sweats, weight loss, chest pain, or shortness of  breath.   Objective:    General: Well Developed, well nourished, and in no acute distress.  Neuro: Alert and oriented x3, extra-ocular muscles intact, sensation grossly intact.  HEENT: Normocephalic, atraumatic, pupils equal round reactive to light, neck supple, no masses, no lymphadenopathy, thyroid nonpalpable.  Skin: Warm and dry, no rashes.  There is a large abscess on the right buttock. Cardiac: Regular rate and rhythm, no murmurs rubs or gallops, no lower extremity edema.  Respiratory: Clear to auscultation bilaterally. Not using accessory muscles, speaking in full sentences.  Complicated Incision and drainage of abscess. Risks, benefits, and alternatives explained and consent obtained. Time out conducted. Surface cleaned with alcohol. 10cc lidocaine with epinephine infiltrated around abscess. Adequate anesthesia ensured. Area prepped and draped in a sterile fashion. #11 blade used to make a stab incision into abscess. Pus expressed with pressure. Curved hemostat used to explore 4 quadrants and loculations broken up. Further purulence expressed. Iodoform packing placed leaving a 1-inch tail. Hemostasis achieved. Pt stable. Aftercare and follow-up advised.  Impression and Recommendations:    Abscess of buttock Incision and drainage with packing. Return in 1 week. Doxycycline, hydrocodone for pain, wound culture.  ___________________________________________ Ihor Austinhomas J. Benjamin Stainhekkekandam, M.D., ABFM., CAQSM. Primary Care and Sports Medicine [place] [facility]  Adjunct Instructor of Family Medicine  [employer]

## 2017-07-18 LAB — WOUND CULTURE
MICRO NUMBER:: 90029283
RESULT:: NO GROWTH
SPECIMEN QUALITY:: ADEQUATE

## 2017-07-22 ENCOUNTER — Ambulatory Visit: Payer: BLUE CROSS/BLUE SHIELD | Admitting: Sports Medicine

## 2017-07-22 DIAGNOSIS — L0231 Cutaneous abscess of buttock: Secondary | ICD-10-CM

## 2017-07-22 NOTE — Assessment & Plan Note (Signed)
Clinically resolved, return as needed. 

## 2017-07-22 NOTE — Progress Notes (Signed)
  Subjective: This is a [age] year old male 1 week post incision and drainage with packing of a right buttock abscess, doing well.  Objective: General: Well-developed, well-nourished, and in no acute distress. Buttock: Incision is clean, dry, intact, there is a slight amount of serous drainage but no further erythema, tenderness.  Assessment/plan:   Abscess of buttock Clinically resolved, return as needed. ___________________________________________ Ihor Austinhomas J. Benjamin Stainhekkekandam, M.D., ABFM., CAQSM. Primary Care and Sports Medicine [place] [facility]  Adjunct Instructor of Family Medicine  [employer]

## 2017-08-16 ENCOUNTER — Other Ambulatory Visit: Payer: Self-pay | Admitting: Sports Medicine

## 2017-08-22 ENCOUNTER — Other Ambulatory Visit: Payer: Self-pay | Admitting: Sports Medicine

## 2017-09-05 ENCOUNTER — Other Ambulatory Visit: Payer: Self-pay | Admitting: Sports Medicine

## 2017-09-05 DIAGNOSIS — E291 Testicular hypofunction: Secondary | ICD-10-CM

## 2017-09-09 ENCOUNTER — Ambulatory Visit: Payer: BLUE CROSS/BLUE SHIELD | Admitting: Family Medicine

## 2017-09-09 ENCOUNTER — Encounter: Payer: Self-pay | Admitting: Family Medicine

## 2017-09-09 VITALS — BP 147/75 | HR 80 | Temp 98.7°F | Ht 70.0 in | Wt 211.0 lb

## 2017-09-09 DIAGNOSIS — R6889 Other general symptoms and signs: Secondary | ICD-10-CM

## 2017-09-09 DIAGNOSIS — J101 Influenza due to other identified influenza virus with other respiratory manifestations: Secondary | ICD-10-CM | POA: Diagnosis not present

## 2017-09-09 DIAGNOSIS — R030 Elevated blood-pressure reading, without diagnosis of hypertension: Secondary | ICD-10-CM

## 2017-09-09 LAB — POCT INFLUENZA A/B
Influenza A, POC: POSITIVE — AB
Influenza B, POC: NEGATIVE

## 2017-09-09 MED ORDER — OSELTAMIVIR PHOSPHATE 75 MG PO CAPS: 75.0000 mg | ORAL_CAPSULE | Freq: Two times a day (BID) | ORAL | 0 refills | Status: DC

## 2017-09-09 MED ORDER — BENZONATATE 200 MG PO CAPS: 200.0000 mg | ORAL_CAPSULE | Freq: Three times a day (TID) | ORAL | 1 refills | Status: DC | PRN

## 2017-09-09 MED ORDER — GUAIFENESIN-CODEINE 100-10 MG/5ML PO SOLN: 5.0000 mL | Freq: Four times a day (QID) | ORAL | 0 refills | Status: DC | PRN

## 2017-09-09 NOTE — Progress Notes (Signed)
Shaun Camacho is a 32 y.o. male who presents to [name]: Primary Care Sports Medicine today for fevers chills body aches cough.  Symptoms present for 2 days.  Positive sick contacts at home with influenza.  Patient did not receive a flu shot this year.  No vomiting or diarrhea.  He is tried over-the-counter medications which help a bit.  He denies significant shortness of breath chest pain palpitations.   Past Medical History:  Diagnosis Date  . Anxiety and depression 08/23/2016   08/23/2016 PHQ9 = 6, GAD7 = 7 09/12/2016 PHQ9 = 6, GAD7 = 3 10/11/2016 PHQ9 = 1, GAD7 = 1 12/27/2016 PHQ9 = 3, GAD7 = 4 01/24/2017 PHQ9 = 2, GAD7 = 5  . GERD (gastroesophageal reflux disease) 12/29/2015  . Male hypogonadism 01/12/2016   Past Surgical History:  Procedure Laterality Date  . SURGERY SCROTAL / TESTICULAR     Social History   Tobacco Use  . Smoking status: Current Every Day Smoker    Packs/day: 0.50    Years: 18.00    Pack years: 9.00  . Smokeless tobacco: Current User    Types: Chew  Substance Use Topics  . Alcohol use: Not on file    Comment: rare   family history includes Diabetes in his maternal grandmother; Thyroid disease in his father and sister.  ROS as above:  Medications: Current Outpatient Medications  Medication Sig Dispense Refill  . B-D 3CC LUER-LOK SYR 22GX1-1/2 22G X 1-1/2" 3 ML MISC USE AS DIRECTED 10 each 0  . BD HYPODERMIC NEEDLE 18G X 1" MISC USE AS DIRECTED 10 each 0  . fluticasone (FLONASE) 50 MCG/ACT nasal spray One spray in each nostril twice a day, use left hand for right nostril, and right hand for left nostril. 48 g 3  . HYDROcodone-acetaminophen (NORCO/VICODIN) 5-325 MG tablet Take 1 tablet by mouth every 8 (eight) hours as needed for moderate pain. 15 tablet 0  . omeprazole (PRILOSEC) 40 MG capsule TAKE 1 CAPSULE BY MOUTH EVERY DAY 90 capsule 0  . testosterone  cypionate (DEPOTESTOSTERONE CYPIONATE) 200 MG/ML injection INJECT 1 ML INTO THE MUSCLE EVERY 14 DAYS 10 mL 0  . VIIBRYD 10 MG TABS TAKE 1 TABLET(10 MG) BY MOUTH DAILY 30 tablet 11  . benzonatate (TESSALON) 200 MG capsule Take 1 capsule (200 mg total) by mouth 3 (three) times daily as needed for cough. 45 capsule 1  . guaiFENesin-codeine 100-10 MG/5ML syrup Take 5 mLs by mouth every 6 (six) hours as needed for cough. 120 mL 0  . oseltamivir (TAMIFLU) 75 MG capsule Take 1 capsule (75 mg total) by mouth 2 (two) times daily. 10 capsule 0   No current facility-administered medications for this visit.    Allergies  Allergen Reactions  . Tramadol Palpitations  . Meloxicam Other (See Comments)    Somnolence    Health Maintenance Health Maintenance  Topic Date Due  . HIV Screening  10/21/1998  . TETANUS/TDAP  10/21/2002  . INFLUENZA VACCINE  05/12/2018 (Originally 02/05/2017)     Exam:  BP (!) 147/75   Pulse 80   Temp 98.7 F (37.1 C) (Oral)   Ht 5\' 10"  (1.778 m)   Wt 211 lb (95.7 kg)   BMI 30.28 kg/m  Gen: Well NAD HEENT: EOMI,  MMM clear nasal discharge.  Normal tympanic membranes.  Cervical lymphadenopathy present bilaterally Lungs: Normal work of breathing. CTABL Heart: RRR no MRG Abd: NABS, Soft. Nondistended, Nontender Exts: Brisk  capillary refill, warm and well perfused.    Results for orders placed or performed in visit on 09/09/17 (from the past 72 hour(s))  POCT Influenza A/B     Status: Abnormal   Collection Time: 09/09/17  2:44 PM  Result Value Ref Range   Influenza A, POC Positive (A) Negative   Influenza B, POC Negative Negative   No results found.    Assessment and Plan: [age] y.o. male with influenza.  Treatment with Tamiflu Tylenol over-the-counter and ibuprofen over-the-counter.  Additionally will use Tessalon Perles and codeine cough syrup for cough suppression.  Recheck with PCP in the near future to address elevated blood pressure seen today on exam.   Patient does not have a history of hypertension.   Orders Placed This Encounter  Procedures  . POCT Influenza A/B   Meds ordered this encounter  Medications  . oseltamivir (TAMIFLU) 75 MG capsule    Sig: Take 1 capsule (75 mg total) by mouth 2 (two) times daily.    Dispense:  10 capsule    Refill:  0  . benzonatate (TESSALON) 200 MG capsule    Sig: Take 1 capsule (200 mg total) by mouth 3 (three) times daily as needed for cough.    Dispense:  45 capsule    Refill:  1  . guaiFENesin-codeine 100-10 MG/5ML syrup    Sig: Take 5 mLs by mouth every 6 (six) hours as needed for cough.    Dispense:  120 mL    Refill:  0     Discussed warning signs or symptoms. Please see discharge instructions. Patient expresses understanding.

## 2017-09-09 NOTE — Patient Instructions (Addendum)
Thank you for coming in today. Continue over the counter cold medicine Take in addition ibuprofen or aleve for fever and body aches. For cough take tessalon during the day.  Use codeine cough medicine at bedtime or when off duty.  Sugar free menthol cough drops help.    Influenza, Adult Influenza, more commonly known as "the flu," is a viral infection that primarily affects the respiratory tract. The respiratory tract includes organs that help you breathe, such as the lungs, nose, and throat. The flu causes many common cold symptoms, as well as a high fever and body aches. The flu spreads easily from person to person (is contagious). Getting a flu shot (influenza vaccination) every year is the best way to prevent influenza. What are the causes? Influenza is caused by a virus. You can catch the virus by:  Breathing in droplets from an infected person's cough or sneeze.  Touching something that was recently contaminated with the virus and then touching your mouth, nose, or eyes.  What increases the risk? The following factors may make you more likely to get the flu:  Not cleaning your hands frequently with soap and water or alcohol-based hand sanitizer.  Having close contact with many people during cold and flu season.  Touching your mouth, eyes, or nose without washing or sanitizing your hands first.  Not drinking enough fluids or not eating a healthy diet.  Not getting enough sleep or exercise.  Being under a high amount of stress.  Not getting a yearly (annual) flu shot.  You may be at a higher risk of complications from the flu, such as a severe lung infection (pneumonia), if you:  Are over the age of 34.  Are pregnant.  Have a weakened disease-fighting system (immune system). You may have a weakened immune system if you: ? Have HIV or AIDS. ? Are undergoing chemotherapy. ? Aretaking medicines that reduce the activity of (suppress) the immune system.  Have a long-term  (chronic) illness, such as heart disease, kidney disease, diabetes, or lung disease.  Have a liver disorder.  Are obese.  Have anemia.  What are the signs or symptoms? Symptoms of this condition typically last 4-10 days and may include:  Fever.  Chills.  Headache, body aches, or muscle aches.  Sore throat.  Cough.  Runny or congested nose.  Chest discomfort and cough.  Poor appetite.  Weakness or tiredness (fatigue).  Dizziness.  Nausea or vomiting.  How is this diagnosed? This condition may be diagnosed based on your medical history and a physical exam. Your health care provider may do a nose or throat swab test to confirm the diagnosis. How is this treated? If influenza is detected early, you can be treated with antiviral medicine that can reduce the length of your illness and the severity of your symptoms. This medicine may be given by mouth (orally) or through an IV tube that is inserted in one of your veins. The goal of treatment is to relieve symptoms by taking care of yourself at home. This may include taking over-the-counter medicines, drinking plenty of fluids, and adding humidity to the air in your home. In some cases, influenza goes away on its own. Severe influenza or complications from influenza may be treated in a hospital. Follow these instructions at home:  Take over-the-counter and prescription medicines only as told by your health care provider.  Use a cool mist humidifier to add humidity to the air in your home. This can make breathing easier.  Rest as needed.  Drink enough fluid to keep your urine clear or pale yellow.  Cover your mouth and nose when you cough or sneeze.  Wash your hands with soap and water often, especially after you cough or sneeze. If soap and water are not available, use hand sanitizer.  Stay home from work or school as told by your health care provider. Unless you are visiting your health care provider, try to avoid  leaving home until your fever has been gone for 24 hours without the use of medicine.  Keep all follow-up visits as told by your health care provider. This is important. How is this prevented?  Getting an annual flu shot is the best way to avoid getting the flu. You may get the flu shot in late summer, fall, or winter. Ask your health care provider when you should get your flu shot.  Wash your hands often or use hand sanitizer often.  Avoid contact with people who are sick during cold and flu season.  Eat a healthy diet, drink plenty of fluids, get enough sleep, and exercise regularly. Contact a health care provider if:  You develop new symptoms.  You have: ? Chest pain. ? Diarrhea. ? A fever.  Your cough gets worse.  You produce more mucus.  You feel nauseous or you vomit. Get help right away if:  You develop shortness of breath or difficulty breathing.  Your skin or nails turn a bluish color.  You have severe pain or stiffness in your neck.  You develop a sudden headache or sudden pain in your face or ear.  You cannot stop vomiting. This information is not intended to replace advice given to you by your health care provider. Make sure you discuss any questions you have with your health care provider. Document Released: 06/21/2000 Document Revised: 11/30/2015 Document Reviewed: 04/18/2015 Elsevier Interactive Patient Education  2017 [employer].

## 2017-09-16 DIAGNOSIS — F411 Generalized anxiety disorder: Secondary | ICD-10-CM | POA: Diagnosis not present

## 2017-10-08 ENCOUNTER — Encounter: Payer: Self-pay | Admitting: Sports Medicine

## 2017-10-08 ENCOUNTER — Ambulatory Visit: Payer: BLUE CROSS/BLUE SHIELD | Admitting: Sports Medicine

## 2017-10-08 DIAGNOSIS — L988 Other specified disorders of the skin and subcutaneous tissue: Secondary | ICD-10-CM | POA: Diagnosis not present

## 2017-10-08 DIAGNOSIS — Z0000 Encounter for general adult medical examination without abnormal findings: Secondary | ICD-10-CM | POA: Diagnosis not present

## 2017-10-08 MED ORDER — DOXYCYCLINE HYCLATE 100 MG PO TABS: 100.0000 mg | ORAL_TABLET | Freq: Two times a day (BID) | ORAL | 0 refills | Status: AC

## 2017-10-08 MED ORDER — HYDROCODONE-ACETAMINOPHEN 5-325 MG PO TABS: 1.0000 | ORAL_TABLET | Freq: Three times a day (TID) | ORAL | 0 refills | Status: DC | PRN

## 2017-10-08 NOTE — Progress Notes (Signed)
Subjective:    CC: follow-up  HPI: 3 months ago we did an incision and drainage on a buttock abscess, at the cleft. He did extremely well, but is unfortunately having a recurrence of swelling, pain, erythema, no drainage. He tells us that this is been going on for decades, he and his wife will typically sticking needle and the lesion, drain pus, and it will be gone for several months to several years. Unfortunately there is recurrence, moderate, persistent. No fevers or chills. No nausea, vomiting, diarrhea, constipation.  I reviewed the past medical history, family history, social history, surgical history, and allergies today and no changes were needed.  Please see the problem list section below in epic for further details.  Past Medical History: Past Medical History:  Diagnosis Date  . Anxiety and depression 08/23/2016   08/23/2016 PHQ9 = 6, GAD7 = 7 09/12/2016 PHQ9 = 6, GAD7 = 3 10/11/2016 PHQ9 = 1, GAD7 = 1 12/27/2016 PHQ9 = 3, GAD7 = 4 01/24/2017 PHQ9 = 2, GAD7 = 5  . GERD (gastroesophageal reflux disease) 12/29/2015  . Male hypogonadism 01/12/2016   Past Surgical History: Past Surgical History:  Procedure Laterality Date  . SURGERY SCROTAL / TESTICULAR     Social History: Social History   Socioeconomic History  . Marital status: Married    Spouse name: Not on file  . Number of children: Not on file  . Years of education: Not on file  . Highest education level: Not on file  Occupational History  . Not on file  Social Needs  . Financial resource strain: Not on file  . Food insecurity:    Worry: Not on file    Inability: Not on file  . Transportation needs:    Medical: Not on file    Non-medical: Not on file  Tobacco Use  . Smoking status: Current Every Day Smoker    Packs/day: 0.50    Years: 18.00    Pack years: 9.00  . Smokeless tobacco: Current User    Types: Chew  Substance and Sexual Activity  . Alcohol use: Not on file    Comment: rare  . Drug use: No  .  Sexual activity: Not on file  Lifestyle  . Physical activity:    Days per week: Not on file    Minutes per session: Not on file  . Stress: Not on file  Relationships  . Social connections:    Talks on phone: Not on file    Gets together: Not on file    Attends religious service: Not on file    Active member of club or organization: Not on file    Attends meetings of clubs or organizations: Not on file    Relationship status: Not on file  Other Topics Concern  . Not on file  Social History Narrative  . Not on file   Family History: Family History  Problem Relation Age of Onset  . Thyroid disease Father   . Thyroid disease Sister   . Diabetes Maternal Grandmother    Allergies: Allergies  Allergen Reactions  . Tramadol Palpitations  . Meloxicam Other (See Comments)    Somnolence   Medications: See med rec.  Review of Systems: No fevers, chills, night sweats, weight loss, chest pain, or shortness of breath.   Objective:    General: Well Developed, well nourished, and in no acute distress.  Neuro: Alert and oriented x3, extra-ocular muscles intact, sensation grossly intact.  HEENT: Normocephalic, atraumatic, pupils equal round  reactive to light, neck supple, no masses, no lymphadenopathy, thyroid nonpalpable.  Skin: Warm and dry, no rashes. Cardiac: Regular rate and rhythm, no murmurs rubs or gallops, no lower extremity edema.  Respiratory: Clear to auscultation bilaterally. Not using accessory muscles, speaking in full sentences. Buttocks: At the right side of the superior gluteal cleft there is a palpable abscess, really not warm, not fluctuant. Not indurated. Minimally tender. No drainage.  Impression and Recommendations:    Pilonidal disease Incision and drainage with complete resolution 3 months ago, this has been an ongoing process for him for over a decade. Not a whole lot of fluctuance today, I'm going to add doxycycline, hydrocodone. Considering multiple  recurrences I would like him to touch base with Gen. surgery to discuss full excision.  Annual physical exam Patient will return for fasting labs  ___________________________________________ [name]. Benjamin Stain, M.D., ABFM., CAQSM. Primary Care and Sports Medicine  [facility]  Adjunct Instructor of Family Medicine  [employer]

## 2017-10-08 NOTE — Assessment & Plan Note (Signed)
Incision and drainage with complete resolution 3 months ago, this has been an ongoing process for him for over a decade. Not a whole lot of fluctuance today, I'm going to add doxycycline, hydrocodone. Considering multiple recurrences I would like him to touch base with Gen. surgery to discuss full excision.

## 2017-10-08 NOTE — Assessment & Plan Note (Signed)
Patient will return for fasting labs.

## 2017-11-10 DIAGNOSIS — L0591 Pilonidal cyst without abscess: Secondary | ICD-10-CM | POA: Diagnosis not present

## 2017-11-28 ENCOUNTER — Ambulatory Visit: Payer: BLUE CROSS/BLUE SHIELD | Admitting: Sports Medicine

## 2017-11-28 DIAGNOSIS — M7711 Lateral epicondylitis, right elbow: Secondary | ICD-10-CM | POA: Diagnosis not present

## 2017-11-28 DIAGNOSIS — L988 Other specified disorders of the skin and subcutaneous tissue: Secondary | ICD-10-CM | POA: Diagnosis not present
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
## 2017-11-28 NOTE — Assessment & Plan Note (Signed)
Full excision and primary closure planned with general surgery.

## 2017-12-06 ENCOUNTER — Other Ambulatory Visit: Payer: Self-pay | Admitting: Sports Medicine

## 2017-12-28 ENCOUNTER — Other Ambulatory Visit: Payer: Self-pay | Admitting: Sports Medicine

## 2018-02-05 ENCOUNTER — Ambulatory Visit (INDEPENDENT_AMBULATORY_CARE_PROVIDER_SITE_OTHER): Payer: BLUE CROSS/BLUE SHIELD | Admitting: Physician Assistant

## 2018-02-05 ENCOUNTER — Encounter: Payer: Self-pay | Admitting: Physician Assistant

## 2018-02-05 VITALS — BP 125/86 | HR 64 | Temp 98.0°F | Wt 202.0 lb

## 2018-02-05 DIAGNOSIS — A084 Viral intestinal infection, unspecified: Secondary | ICD-10-CM

## 2018-02-05 MED ORDER — ONDANSETRON 4 MG PO TBDP: 4.0000 mg | ORAL_TABLET | Freq: Three times a day (TID) | ORAL | 0 refills | Status: DC | PRN

## 2018-02-05 MED ORDER — PROMETHAZINE HCL 25 MG PO TABS: 12.5000 mg | ORAL_TABLET | Freq: Three times a day (TID) | ORAL | 0 refills | Status: DC | PRN

## 2018-02-05 MED ORDER — ONDANSETRON HCL 4 MG PO TABS
4.0000 mg | ORAL_TABLET | Freq: Once | ORAL | Status: AC
  Administered 2018-02-05: 4 mg via ORAL

## 2018-02-05 NOTE — Patient Instructions (Signed)
Viral Gastroenteritis, Adult  Viral gastroenteritis is also known as the stomach flu. This condition is caused by various viruses. These viruses can be passed from person to person very easily (are very contagious). This condition may affect your stomach, small intestine, and large intestine. It can cause sudden watery diarrhea, fever, and vomiting.  Diarrhea and vomiting can make you feel weak and cause you to become dehydrated. You may not be able to keep fluids down. Dehydration can make you tired and thirsty, cause you to have a dry mouth, and decrease how often you urinate. Older adults and people with other diseases or a weak immune system are at higher risk for dehydration.  It is important to replace the fluids that you lose from diarrhea and vomiting. If you become severely dehydrated, you may need to get fluids through an IV tube.  What are the causes?  Gastroenteritis is caused by various viruses, including rotavirus and norovirus. Norovirus is the most common cause in adults.  You can get sick by eating food, drinking water, or touching a surface contaminated with one of these viruses. You can also get sick from sharing utensils or other personal items with an infected person.  What increases the risk?  This condition is more likely to develop in people:  · Who have a weak defense system (immune system).  · Who live with one or more children who are younger than 2 years old.  · Who live in a nursing home.  · Who go on cruise ships.    What are the signs or symptoms?  Symptoms of this condition start suddenly 1-2 days after exposure to a virus. Symptoms may last a few days or as long as a week. The most common symptoms are watery diarrhea and vomiting. Other symptoms include:  · Fever.  · Headache.  · Fatigue.  · Pain in the abdomen.  · Chills.  · Weakness.  · Nausea.  · Muscle aches.  · Loss of appetite.    How is this diagnosed?  This condition is diagnosed with a medical history and physical exam. You  may also have a stool test to check for viruses or other infections.  How is this treated?  This condition typically goes away on its own. The focus of treatment is to restore lost fluids (rehydration). Your health care provider may recommend that you take an oral rehydration solution (ORS) to replace important salts and minerals (electrolytes) in your body. Severe cases of this condition may require giving fluids through an IV tube.  Treatment may also include medicine to help with your symptoms.  Follow these instructions at home:  Follow instructions from your health care provider about how to care for yourself at home.  Eating and drinking  Follow these recommendations as told by your health care provider:  · Take an ORS. This is a drink that is sold at pharmacies and retail stores.  · Drink clear fluids in small amounts as you are able. Clear fluids include water, ice chips, diluted fruit juice, and low-calorie sports drinks.  · Eat bland, easy-to-digest foods in small amounts as you are able. These foods include bananas, applesauce, rice, lean meats, toast, and crackers.  · Avoid fluids that contain a lot of sugar or caffeine, such as energy drinks, sports drinks, and soda.  · Avoid alcohol.  · Avoid spicy or fatty foods.    General instructions    · Drink enough fluid to keep your urine clear or   pale yellow.  · Wash your hands often. If soap and water are not available, use hand sanitizer.  · Make sure that all people in your household wash their hands well and often.  · Take over-the-counter and prescription medicines only as told by your health care provider.  · Rest at home while you recover.  · Watch your condition for any changes.  · Take a warm bath to relieve any burning or pain from frequent diarrhea episodes.  · Keep all follow-up visits as told by your health care provider. This is important.  Contact a health care provider if:  · You cannot keep fluids down.  · Your symptoms get worse.  · You have  new symptoms.  · You feel light-headed or dizzy.  · You have muscle cramps.  Get help right away if:  · You have chest pain.  · You feel extremely weak or you faint.  · You see blood in your vomit.  · Your vomit looks like coffee grounds.  · You have bloody or black stools or stools that look like tar.  · You have a severe headache, a stiff neck, or both.  · You have a rash.  · You have severe pain, cramping, or bloating in your abdomen.  · You have trouble breathing or you are breathing very quickly.  · Your heart is beating very quickly.  · Your skin feels cold and clammy.  · You feel confused.  · You have pain when you urinate.  · You have signs of dehydration, such as:  ? Dark urine, very little urine, or no urine.  ? Cracked lips.  ? Dry mouth.  ? Sunken eyes.  ? Sleepiness.  ? Weakness.  This information is not intended to replace advice given to you by your health care provider. Make sure you discuss any questions you have with your health care provider.  Document Released: 06/24/2005 Document Revised: 12/06/2015 Document Reviewed: 02/28/2015  Elsevier Interactive Patient Education © 2018 Elsevier Inc.

## 2018-02-05 NOTE — Progress Notes (Signed)
HPI:                                                                [name] is a 32 y.o. male who [facility] Medicine today for nausea/vomiting  Emesis   This is a new problem. The current episode started today. The problem occurs more than 10 times per day. The problem has been unchanged. The emesis has an appearance of bile. There has been no fever. Associated symptoms include abdominal pain, chills and myalgias. Pertinent negatives include no diarrhea or fever. He has tried increased fluids and sleep for the symptoms. The treatment provided mild relief.      Past Medical History:  Diagnosis Date  . Anxiety and depression 08/23/2016   08/23/2016 PHQ9 = 6, GAD7 = 7 09/12/2016 PHQ9 = 6, GAD7 = 3 10/11/2016 PHQ9 = 1, GAD7 = 1 12/27/2016 PHQ9 = 3, GAD7 = 4 01/24/2017 PHQ9 = 2, GAD7 = 5  . GERD (gastroesophageal reflux disease) 12/29/2015  . Male hypogonadism 01/12/2016   Past Surgical History:  Procedure Laterality Date  . SURGERY SCROTAL / TESTICULAR     Social History   Tobacco Use  . Smoking status: Current Every Day Smoker    Packs/day: 0.50    Years: 18.00    Pack years: 9.00  . Smokeless tobacco: Current User    Types: Chew  Substance Use Topics  . Alcohol use: Not on file    Comment: rare   family history includes Diabetes in his maternal grandmother; Thyroid disease in his father and sister.    ROS: negative except as noted in the HPI  Medications: Current Outpatient Medications  Medication Sig Dispense Refill  . B-D 3CC LUER-LOK SYR 22GX1-1/2 22G X 1-1/2" 3 ML MISC USE AS DIRECTED 10 each 0  . BD HYPODERMIC NEEDLE 18G X 1" MISC USE AS DIRECTED 10 each 0  . HYDROcodone-acetaminophen (NORCO/VICODIN) 5-325 MG tablet Take 1 tablet by mouth every 8 (eight) hours as needed for moderate pain. 15 tablet 0  . omeprazole (PRILOSEC) 40 MG capsule TAKE 1 CAPSULE BY MOUTH EVERY DAY 90 capsule 0  . testosterone  cypionate (DEPOTESTOSTERONE CYPIONATE) 200 MG/ML injection INJECT 1 ML INTO THE MUSCLE EVERY 14 DAYS 10 mL 0  . VIIBRYD 10 MG TABS TAKE 1 TABLET(10 MG) BY MOUTH DAILY 30 tablet 11   No current facility-administered medications for this visit.    Allergies  Allergen Reactions  . Tramadol Palpitations  . Meloxicam Other (See Comments)    Somnolence       Objective:  BP 125/86   Pulse 64   Temp 98 F (36.7 C) (Oral)   Wt 202 lb (91.6 kg)   BMI 28.98 kg/m  Gen:  alert, ill-appearing, not toxic-appearing, no distress, appropriate for age HEENT: head normocephalic without obvious abnormality, conjunctiva and cornea clear, no icterus, moist mucous membranes, trachea midline Pulm: Normal work of breathing, normal phonation, clear to auscultation bilaterally, no wheezes, rales or rhonchi CV: Normal rate, regular rhythm, s1 and s2 distinct, no murmurs, clicks or rubs  GI: abdomen soft, there is generalized tenderness, no guarding or rebound, no rigidity Neuro: alert and oriented x 3, no tremor MSK: extremities atraumatic, normal gait and station  Skin: intact, no rashes on exposed skin, no jaundice, no cyanosis    No results found for this or any previous visit (from the past 72 hour(s)). No results found.    Assessment and Plan: 32 y.o. male with   Viral gastroenteritis - Plan: ondansetron (ZOFRAN) tablet 4 mg, ondansetron (ZOFRAN-ODT) 4 MG disintegrating tablet, promethazine (PHENERGAN) 25 MG tablet - patient given Zofran 4 mg in office today and PO challenged. Passed PO challenge - supportive care, PO hydration with ORS/diluted gatorade at home, antiemetics prn  Patient education and anticipatory guidance given Patient agrees with treatment plan Follow-up as needed if symptoms worsen or fail to improve  [name] PA-C

## 2018-02-07 ENCOUNTER — Other Ambulatory Visit: Payer: Self-pay | Admitting: Sports Medicine

## 2018-02-07 DIAGNOSIS — E291 Testicular hypofunction: Secondary | ICD-10-CM

## 2018-03-26 ENCOUNTER — Ambulatory Visit (INDEPENDENT_AMBULATORY_CARE_PROVIDER_SITE_OTHER): Payer: BLUE CROSS/BLUE SHIELD | Admitting: Sports Medicine

## 2018-03-26 DIAGNOSIS — M5412 Radiculopathy, cervical region: Secondary | ICD-10-CM | POA: Diagnosis not present

## 2018-03-26 MED ORDER — PREDNISONE 50 MG PO TABS: ORAL_TABLET | ORAL | 0 refills | Status: DC

## 2018-03-26 NOTE — Progress Notes (Signed)
Subjective:    CC: Neck and arm pain  HPI: This is a pleasant 32 year old male, for the past few days he said increasing pain in the back of his neck, with radiation down the right arm to the first, second, third fingers.  Worse at night and in the morning.  He is getting progressive weakness in the hand, moderate, persistent.  He is not wearing nighttime splints, we did diagnose him with carpal tunnel syndrome in the past.  I reviewed the past medical history, family history, social history, surgical history, and allergies today and no changes were needed.  Please see the problem list section below in epic for further details.  Past Medical History: Past Medical History:  Diagnosis Date  . Anxiety and depression 08/23/2016   08/23/2016 PHQ9 = 6, GAD7 = 7 09/12/2016 PHQ9 = 6, GAD7 = 3 10/11/2016 PHQ9 = 1, GAD7 = 1 12/27/2016 PHQ9 = 3, GAD7 = 4 01/24/2017 PHQ9 = 2, GAD7 = 5  . GERD (gastroesophageal reflux disease) 12/29/2015  . Male hypogonadism 01/12/2016   Past Surgical History: Past Surgical History:  Procedure Laterality Date  . SURGERY SCROTAL / TESTICULAR     Social History: Social History   Socioeconomic History  . Marital status: Married    Spouse name: Not on file  . Number of children: Not on file  . Years of education: Not on file  . Highest education level: Not on file  Occupational History  . Not on file  Social Needs  . Financial resource strain: Not on file  . Food insecurity:    Worry: Not on file    Inability: Not on file  . Transportation needs:    Medical: Not on file    Non-medical: Not on file  Tobacco Use  . Smoking status: Current Every Day Smoker    Packs/day: 0.50    Years: 18.00    Pack years: 9.00  . Smokeless tobacco: Current User    Types: Chew  Substance and Sexual Activity  . Alcohol use: Not on file    Comment: rare  . Drug use: No  . Sexual activity: Not on file  Lifestyle  . Physical activity:    Days per week: Not on file   Minutes per session: Not on file  . Stress: Not on file  Relationships  . Social connections:    Talks on phone: Not on file    Gets together: Not on file    Attends religious service: Not on file    Active member of club or organization: Not on file    Attends meetings of clubs or organizations: Not on file    Relationship status: Not on file  Other Topics Concern  . Not on file  Social History Narrative  . Not on file   Family History: Family History  Problem Relation Age of Onset  . Thyroid disease Father   . Thyroid disease Sister   . Diabetes Maternal Grandmother    Allergies: Allergies  Allergen Reactions  . Tramadol Palpitations  . Meloxicam Other (See Comments)    Somnolence   Medications: See med rec.  Review of Systems: No fevers, chills, night sweats, weight loss, chest pain, or shortness of breath.   Objective:    General: Well Developed, well nourished, and in no acute distress.  Neuro: Alert and oriented x3, extra-ocular muscles intact, sensation grossly intact.  HEENT: Normocephalic, atraumatic, pupils equal round reactive to light, neck supple, no masses, no lymphadenopathy, thyroid nonpalpable.  Skin: Warm and dry, no rashes. Cardiac: Regular rate and rhythm, no murmurs rubs or gallops, no lower extremity edema.  Respiratory: Clear to auscultation bilaterally. Not using accessory muscles, speaking in full sentences. Neck: Negative spurling's Full neck range of motion Grip strength and sensation normal in bilateral hands Strength good C4 to T1 distribution No sensory change to C4 to T1 Reflexes normal  Impression and Recommendations:    Radiculitis of right cervical region Question right cervical radiculitis, C6 versus C7 distribution. Progressive weakness. X-rays, prednisone, cervical spine MRI. Nerve conduction/EMG. Rehab exercises given. He does desire to have all his imaging done at  [facility]. ___________________________________________ Ihor Austinhomas J. Benjamin Stainhekkekandam, M.D., ABFM., CAQSM. Primary Care and Sports Medicine [facility]  Adjunct Instructor of Family Medicine  [employer]

## 2018-03-26 NOTE — Assessment & Plan Note (Signed)
Question right cervical radiculitis, C6 versus C7 distribution. Progressive weakness. X-rays, prednisone, cervical spine MRI. Nerve conduction/EMG. Rehab exercises given. He does desire to have all his [facility] at [facility].

## 2018-03-28 DIAGNOSIS — M4802 Spinal stenosis, cervical region: Secondary | ICD-10-CM | POA: Diagnosis not present

## 2018-03-30 DIAGNOSIS — M50323 Other cervical disc degeneration at C6-C7 level: Secondary | ICD-10-CM | POA: Diagnosis not present

## 2018-03-31 ENCOUNTER — Telehealth: Payer: Self-pay | Admitting: *Deleted

## 2018-03-31 ENCOUNTER — Encounter: Payer: Self-pay | Admitting: Sports Medicine

## 2018-03-31 DIAGNOSIS — M5412 Radiculopathy, cervical region: Secondary | ICD-10-CM

## 2018-03-31 NOTE — Telephone Encounter (Signed)
Orders placed, please contact [facility] for scheduling. 

## 2018-03-31 NOTE — Telephone Encounter (Signed)
Pt left vm stating that he received a message from you regarding Prednisone and he said that he would prefer to just go straight to the epidural injection.

## 2018-03-31 NOTE — Telephone Encounter (Signed)
GI contacted and pt notified of order.

## 2018-04-07 ENCOUNTER — Ambulatory Visit (INDEPENDENT_AMBULATORY_CARE_PROVIDER_SITE_OTHER): Payer: BLUE CROSS/BLUE SHIELD | Admitting: Sports Medicine

## 2018-04-07 ENCOUNTER — Encounter: Payer: Self-pay | Admitting: Sports Medicine

## 2018-04-07 DIAGNOSIS — M5412 Radiculopathy, cervical region: Secondary | ICD-10-CM

## 2018-04-07 MED ORDER — GABAPENTIN 300 MG PO CAPS: 300.0000 mg | ORAL_CAPSULE | Freq: Every day | ORAL | 3 refills | Status: DC

## 2018-04-07 NOTE — Progress Notes (Signed)
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  We have ordered a cervical epidural, scheduled for 9 days from now.  He is okay during the day but at night has trouble sleeping secondary to neck pain and paresthesias into the hands and fingertips.  I reviewed the past medical history, family history, social history, surgical history, and allergies today and no changes were needed.  Please see the problem list section below in epic for further details.  Past Medical History: Past Medical History:  Diagnosis Date  . Anxiety and depression 08/23/2016   08/23/2016 PHQ9 = 6, GAD7 = 7 09/12/2016 PHQ9 = 6, GAD7 = 3 10/11/2016 PHQ9 = 1, GAD7 = 1 12/27/2016 PHQ9 = 3, GAD7 = 4 01/24/2017 PHQ9 = 2, GAD7 = 5  . GERD (gastroesophageal reflux disease) 12/29/2015  . Male hypogonadism 01/12/2016   Past Surgical History: Past Surgical History:  Procedure Laterality Date  . SURGERY SCROTAL / TESTICULAR     Social History: Social History   Socioeconomic History  . Marital status: Married    Spouse name: Not on file  . Number of children: Not on file  . Years of education: Not on file  . Highest education level: Not on file  Occupational History  . Not on file  Social Needs  . Financial resource strain: Not on file  . Food insecurity:    Worry: Not on file    Inability: Not on file  . Transportation needs:    Medical: Not on file    Non-medical: Not on file  Tobacco Use  . Smoking status: Current Every Day Smoker    Packs/day: 0.50    Years: 18.00    Pack years: 9.00  . Smokeless tobacco: Current User    Types: Chew  Substance and Sexual Activity  . Alcohol use: Not on file    Comment: rare  . Drug use: No  . Sexual activity: Not on file  Lifestyle  . Physical activity:    Days per week: Not on file    Minutes per session: Not on file  . Stress: Not on  file  Relationships  . Social connections:    Talks on phone: Not on file    Gets together: Not on file    Attends religious service: Not on file    Active member of club or organization: Not on file    Attends meetings of clubs or organizations: Not on file    Relationship status: Not on file  Other Topics Concern  . Not on file  Social History Narrative  . Not on file   Family History: Family History  Problem Relation Age of Onset  . Thyroid disease Father   . Thyroid disease Sister   . Diabetes Maternal Grandmother    Allergies: Allergies  Allergen Reactions  . Tramadol Palpitations  . Meloxicam Other (See Comments)    Somnolence   Medications: See med rec.  Review of Systems: No fevers, chills, night sweats, weight loss, chest pain, or shortness of breath.   Objective:    General: Well Developed, well nourished, and in no acute distress.  Neuro: Alert and oriented x3, extra-ocular muscles intact, sensation grossly intact.  HEENT: Normocephalic, atraumatic, pupils equal round reactive to light, neck supple, no masses, no lymphadenopathy, thyroid nonpalpable.  Skin: Warm and dry, no rashes. Cardiac: Regular rate and rhythm,  no murmurs rubs or gallops, no lower extremity edema.  Respiratory: Clear to auscultation bilaterally. Not using accessory muscles, speaking in full sentences.  I reviewed the cervical spine MRI in detail with the patient, we also discussed the develop mental anthropology of the spine.  Impression and Recommendations:    Radiculitis of right cervical region Right C7 and left C8 distribution cervical radiculitis. MRI does show a large C6-C7 disc protrusion. Epidural is scheduled for about 9 days from now. Lots of nocturnal symptoms, adding gabapentin 300 mg to be taken at bedtime. Return to see me 1 month after injection.  I spent 25 minutes with this patient, greater than 50% was face-to-face time counseling regarding the above diagnoses,  specifically discussed spinal develop mental anthropology and anatomy. ___________________________________________ [name]. Benjamin Stain, M.D., ABFM., CAQSM. Primary Care and Sports Medicine [facility]  Adjunct Instructor of Family Medicine  [employer]

## 2018-04-07 NOTE — Assessment & Plan Note (Signed)
Right C7 and left C8 distribution cervical radiculitis. MRI does show a large C6-C7 disc protrusion. Epidural is scheduled for about 9 days from now. Lots of nocturnal symptoms, adding gabapentin 300 mg to be taken at bedtime. Return to see me 1 month after injection.

## 2018-04-16 ENCOUNTER — Ambulatory Visit
Admission: RE | Admit: 2018-04-16 | Discharge: 2018-04-16 | Disposition: A | Discharge status: Home / Self Care | Payer: BLUE CROSS/BLUE SHIELD | Source: Ambulatory Visit | Attending: Sports Medicine | Admitting: Sports Medicine

## 2018-04-16 DIAGNOSIS — M47812 Spondylosis without myelopathy or radiculopathy, cervical region: Secondary | ICD-10-CM | POA: Diagnosis not present

## 2018-04-16 MED ORDER — TRIAMCINOLONE ACETONIDE 40 MG/ML IJ SUSP (RADIOLOGY)
60.0000 mg | Freq: Once | INTRAMUSCULAR | Status: AC
  Administered 2018-04-16: 60 mg via EPIDURAL

## 2018-04-16 MED ORDER — IOPAMIDOL (ISOVUE-M 300) INJECTION 61%
1.0000 mL | Freq: Once | INTRAMUSCULAR | Status: AC | PRN
  Administered 2018-04-16: 1 mL via EPIDURAL

## 2018-04-16 NOTE — Discharge Instructions (Signed)

## 2018-04-21 ENCOUNTER — Telehealth: Payer: Self-pay | Admitting: *Deleted

## 2018-04-21 DIAGNOSIS — M5412 Radiculopathy, cervical region: Secondary | ICD-10-CM

## 2018-04-21 NOTE — Telephone Encounter (Signed)
Pt called today stating that he has no relief at all yet from his epidural and is complaining of the numbness/tingling in his left arm is worse.  I explained to him several times that the full efficacy won't be noticed so soon.  Pt verbalizes understanding of that but would like a referral for a surgeon placed within [facility].

## 2018-04-22 ENCOUNTER — Encounter: Payer: Self-pay | Admitting: Sports Medicine

## 2018-04-22 NOTE — Telephone Encounter (Signed)
Referral placed to Dr. Rainey Pines.

## 2018-04-23 ENCOUNTER — Ambulatory Visit: Payer: BLUE CROSS/BLUE SHIELD | Admitting: Sports Medicine

## 2018-04-27 DIAGNOSIS — M5412 Radiculopathy, cervical region: Secondary | ICD-10-CM | POA: Diagnosis not present

## 2018-04-27 DIAGNOSIS — M542 Cervicalgia: Secondary | ICD-10-CM | POA: Diagnosis not present

## 2018-05-01 ENCOUNTER — Ambulatory Visit: Payer: BLUE CROSS/BLUE SHIELD | Admitting: Neurology

## 2018-05-01 ENCOUNTER — Ambulatory Visit (INDEPENDENT_AMBULATORY_CARE_PROVIDER_SITE_OTHER): Payer: BLUE CROSS/BLUE SHIELD | Admitting: Neurology

## 2018-05-01 DIAGNOSIS — M5412 Radiculopathy, cervical region: Secondary | ICD-10-CM

## 2018-05-01 DIAGNOSIS — M542 Cervicalgia: Secondary | ICD-10-CM

## 2018-05-01 DIAGNOSIS — Z0289 Encounter for other administrative examinations: Secondary | ICD-10-CM

## 2018-05-01 NOTE — Procedures (Signed)
Full Name: Shaun Camacho Gender: Male MRN #: [id number] Date of Birth: 1986-04-15    Visit Date: 05/01/18 11:27 Age: 32 Years 6 Months Old Examining Physician: Levert Feinstein, MD  Referring Physician: Monica Becton, MD History: 32 years old male presented with 2 month history of neck pain, radiating pain to both shoulder and arms.  Summary of Tests:  Nerve conduction studies: Bilateral median, ulnar sensory and motor responses were normal.  Electromyography: Selective needle examination of bilateral upper extremity and bilateral cervical paraspinal muscles were normal.   Conclusion:  This is a normal study.  There is no electrodiagnostic evidence of upper extremity neuropathy or bilateral cervical radiculopathy.   ------------------------------- Levert Feinstein, M.D.Ph.D. [facility] [address] [place], [place] [address] Tel: [phone] Fax: [phone]        [facility]    Nerve / Sites Muscle Latency Ref. Amplitude Ref. Rel Amp Segments Distance Velocity Ref. Area    ms ms mV mV %  cm m/s m/s mVms  L Median - APB     Wrist APB 3.1 ?4.4 7.3 ?4.0 100 Wrist - APB 7   29.4     Upper arm APB 7.1  6.8  92.3 Upper arm - Wrist 23 57 ?49 24.6  R Median - APB     Wrist APB 3.2 ?4.4 6.5 ?4.0 100 Wrist - APB 7   26.6     Upper arm APB 7.1  7.1  110 Upper arm - Wrist 23 59 ?49 26.5  L Ulnar - ADM     Wrist ADM 2.3 ?3.3 12.2 ?6.0 100 Wrist - ADM 7   43.9     B.Elbow ADM 6.3  12.4  102 B.Elbow - Wrist 20 51 ?49 43.5     A.Elbow ADM 8.2  11.2  90 A.Elbow - B.Elbow 10 52 ?49 42.5         A.Elbow - Wrist      R Ulnar - ADM     Wrist ADM 2.5 ?3.3 12.5 ?6.0 100 Wrist - ADM 7   42.6     B.Elbow ADM 5.9  12.1  96.8 B.Elbow - Wrist 21 61 ?49 42.6     A.Elbow ADM 7.6  11.6  95.3 A.Elbow - B.Elbow 10 60 ?49 41.0         A.Elbow - Wrist                 SNC    Nerve / Sites Rec. Site Peak Lat Ref.  Amp Ref. Segments Distance Peak Diff Ref.    ms ms V V  cm  ms ms  R Median, Ulnar - Transcarpal comparison     Median Palm Wrist 2.1 ?2.2 33 ?35 Median Palm - Wrist 8       Ulnar Palm Wrist 1.9 ?2.2 15 ?12 Ulnar Palm - Wrist 8          Median Palm - Ulnar Palm  0.2 ?0.4  L Median - Orthodromic (Dig II, Mid palm)     Dig II Wrist 2.8 ?3.4 19 ?10 Dig II - Wrist 13    R Median - Orthodromic (Dig II, Mid palm)     Dig II Wrist 3.4 ?3.4 7 ?10 Dig II - Wrist 13    L Ulnar - Orthodromic, (Dig V, Mid palm)     Dig V Wrist 2.5 ?3.1 8 ?5 Dig V - Wrist 11    R Ulnar - Orthodromic, (  Dig V, Mid palm)     Dig V Wrist 2.5 ?3.1 9 ?5 Dig V - Wrist 87                 F  Wave    Nerve F Lat Ref.   ms ms  L Ulnar - ADM 26.9 ?32.0  R Ulnar - ADM 26.0 ?32.0         EMG full       EMG Summary Table    Spontaneous MUAP Recruitment  Muscle IA Fib PSW Fasc Other Amp Dur. Poly Pattern  R. First dorsal interosseous Normal None None None _______ Normal Normal Normal Normal  R. Pronator teres Normal None None None _______ Normal Normal Normal Normal  R. Biceps brachii Normal None None None _______ Normal Normal Normal Normal  R. Deltoid Normal None None None _______ Normal Normal Normal Normal  R. Triceps brachii Normal None None None _______ Normal Normal Normal Normal  L. First dorsal interosseous Normal None None None _______ Normal Normal Normal Normal  L. Pronator teres Normal None None None _______ Normal Normal Normal Normal  L. Biceps brachii Normal None None None _______ Normal Normal Normal Normal  L. Deltoid Normal None None None _______ Normal Normal Normal Normal  L. Triceps brachii Normal None None None _______ Normal Normal Normal Normal  L. Cervical paraspinals Normal None None None _______ Normal Normal Normal Normal  R. Cervical paraspinals Normal None None None _______ Normal Normal Normal Normal

## 2018-05-01 NOTE — Progress Notes (Signed)
EMG, ncs study report is under procedure

## 2018-05-04 ENCOUNTER — Encounter: Payer: Self-pay | Admitting: Sports Medicine

## 2018-05-04 DIAGNOSIS — M5412 Radiculopathy, cervical region: Secondary | ICD-10-CM | POA: Diagnosis not present

## 2018-05-04 DIAGNOSIS — M542 Cervicalgia: Secondary | ICD-10-CM | POA: Diagnosis not present

## 2018-05-06 ENCOUNTER — Other Ambulatory Visit: Payer: Self-pay | Admitting: Sports Medicine

## 2018-05-06 ENCOUNTER — Encounter: Payer: Self-pay | Admitting: Sports Medicine

## 2018-05-06 ENCOUNTER — Ambulatory Visit: Payer: BLUE CROSS/BLUE SHIELD | Admitting: Sports Medicine

## 2018-05-06 DIAGNOSIS — M5412 Radiculopathy, cervical region: Secondary | ICD-10-CM

## 2018-05-06 DIAGNOSIS — M7711 Lateral epicondylitis, right elbow: Secondary | ICD-10-CM
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
## 2018-05-06 NOTE — Assessment & Plan Note (Signed)
Right C7 distribution radiculitis. MRI shows a right C6-C7 disc protrusion with severe neuroforaminal stenosis. First epidural did not provide relief, he is scheduled for a second epidural. He has seen a neurosurgical NP and resident at [facility], has not yet seen Dr. Artis Flock. I would like him to have a second opinion from Dr. 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 He did have a negative nerve conduction and EMG.

## 2018-05-13 DIAGNOSIS — R1311 Dysphagia, oral phase: Secondary | ICD-10-CM | POA: Diagnosis not present

## 2018-05-13 DIAGNOSIS — R633 Feeding difficulties: Secondary | ICD-10-CM | POA: Diagnosis not present

## 2018-05-15 ENCOUNTER — Other Ambulatory Visit: Payer: Self-pay | Admitting: Sports Medicine

## 2018-05-15 DIAGNOSIS — M5412 Radiculopathy, cervical region: Secondary | ICD-10-CM | POA: Diagnosis not present

## 2018-05-15 DIAGNOSIS — M542 Cervicalgia: Secondary | ICD-10-CM | POA: Diagnosis not present

## 2018-05-20 ENCOUNTER — Encounter: Payer: Self-pay | Admitting: Sports Medicine

## 2018-05-22 ENCOUNTER — Encounter: Payer: Self-pay | Admitting: Sports Medicine

## 2018-05-22 ENCOUNTER — Ambulatory Visit: Payer: BLUE CROSS/BLUE SHIELD | Admitting: Sports Medicine

## 2018-05-22 DIAGNOSIS — M5412 Radiculopathy, cervical region: Secondary | ICD-10-CM | POA: Diagnosis not present

## 2018-05-22 NOTE — Progress Notes (Signed)
Subjective:    CC: Follow-up  HPI: Right C7 radiculitis: Symptoms present for about 2 months now, the patient is a sheriff's deputy, numbness and weakness preclude him feeling safe drawing his weapon.  He has done physical therapy, 2 epidural injections without relief, we got a nerve conduction study and EMG which was negative, though symptoms were fairly acute.  He has had a consult with Dr. Rainey Pines at [facility], her staff/resident did not think he was a surgical candidate, we are getting a second opinion from Dr. Estill Bamberg, his appointment is today.  He does need FMLA and short-term disability paperwork filled out.  I reviewed the past medical history, family history, social history, surgical history, and allergies today and no changes were needed.  Please see the problem list section below in epic for further details.  Past Medical History: Past Medical History:  Diagnosis Date  . Anxiety and depression 08/23/2016   08/23/2016 PHQ9 = 6, GAD7 = 7 09/12/2016 PHQ9 = 6, GAD7 = 3 10/11/2016 PHQ9 = 1, GAD7 = 1 12/27/2016 PHQ9 = 3, GAD7 = 4 01/24/2017 PHQ9 = 2, GAD7 = 5  . GERD (gastroesophageal reflux disease) 12/29/2015  . Male hypogonadism 01/12/2016   Past Surgical History: Past Surgical History:  Procedure Laterality Date  . SURGERY SCROTAL / TESTICULAR     Social History: Social History   Socioeconomic History  . Marital status: Married    Spouse name: Not on file  . Number of children: Not on file  . Years of education: Not on file  . Highest education level: Not on file  Occupational History  . Not on file  Social Needs  . Financial resource strain: Not on file  . Food insecurity:    Worry: Not on file    Inability: Not on file  . Transportation needs:    Medical: Not on file    Non-medical: Not on file  Tobacco Use  . Smoking status: Current Every Day Smoker    Packs/day: 0.50    Years: 18.00    Pack years: 9.00  . Smokeless tobacco: Current User    Types:  Chew  Substance and Sexual Activity  . Alcohol use: Not on file    Comment: rare  . Drug use: No  . Sexual activity: Not on file  Lifestyle  . Physical activity:    Days per week: Not on file    Minutes per session: Not on file  . Stress: Not on file  Relationships  . Social connections:    Talks on phone: Not on file    Gets together: Not on file    Attends religious service: Not on file    Active member of club or organization: Not on file    Attends meetings of clubs or organizations: Not on file    Relationship status: Not on file  Other Topics Concern  . Not on file  Social History Narrative  . Not on file   Family History: Family History  Problem Relation Age of Onset  . Thyroid disease Father   . Thyroid disease Sister   . Diabetes Maternal Grandmother    Allergies: Allergies  Allergen Reactions  . Tramadol Palpitations  . Meloxicam Other (See Comments)    Somnolence   Medications: See med rec.  Review of Systems: No fevers, chills, night sweats, weight loss, chest pain, or shortness of breath.   Objective:    General: Well Developed, well nourished, and in no acute distress.  Neuro: Alert and oriented x3, extra-ocular muscles intact, sensation grossly intact.  HEENT: Normocephalic, atraumatic, pupils equal round reactive to light, neck supple, no masses, no lymphadenopathy, thyroid nonpalpable.  Skin: Warm and dry, no rashes. Cardiac: Regular rate and rhythm, no murmurs rubs or gallops, no lower extremity edema.  Respiratory: Clear to auscultation bilaterally. Not using accessory muscles, speaking in full sentences.  Impression and Recommendations:    Radiculitis of right cervical region Systems right C7 radiculitis, MRI did show a right C6-C7 disc protrusion with severe neuroforaminal stenosis. He did have a negative nerve conduction/EMG but symptoms are fairly acute. He has had 2 cervical epidurals without improvement. We are getting a second opinion  from Dr. Yevette Edwardsumonski, [facility] did not think he would be a surgical candidate. I filled out his FMLA and short-term disability paperwork today. Return to see me as needed.  I spent 25 minutes with this patient, greater than 50% was face-to-face time counseling regarding the above diagnoses, specifically filling out paperwork and discussing treatment plan onward. ___________________________________________ Ihor Austinhomas J. Benjamin Stainhekkekandam, M.D., ABFM., CAQSM. Primary Care and Sports Medicine [facility]  Adjunct Professor of Family Medicine  [employer]

## 2018-05-22 NOTE — Assessment & Plan Note (Signed)
Systems right C7 radiculitis, MRI did show a right C6-C7 disc protrusion with severe neuroforaminal stenosis. He did have a negative nerve conduction/EMG but symptoms are fairly acute. He has had 2 cervical epidurals without improvement. We are getting a second opinion from Dr. Yevette Edwardsumonski, [facility] did not think he would be a surgical candidate. I filled out his FMLA and short-term disability paperwork today. Return to see me as needed.

## 2018-05-25 ENCOUNTER — Ambulatory Visit (INDEPENDENT_AMBULATORY_CARE_PROVIDER_SITE_OTHER): Payer: BLUE CROSS/BLUE SHIELD | Admitting: Sports Medicine

## 2018-05-25 ENCOUNTER — Encounter: Payer: Self-pay | Admitting: Sports Medicine

## 2018-05-25 ENCOUNTER — Ambulatory Visit (INDEPENDENT_AMBULATORY_CARE_PROVIDER_SITE_OTHER): Payer: BLUE CROSS/BLUE SHIELD

## 2018-05-25 DIAGNOSIS — M545 Low back pain, unspecified: Secondary | ICD-10-CM

## 2018-05-25 DIAGNOSIS — M5416 Radiculopathy, lumbar region: Secondary | ICD-10-CM

## 2018-05-25 DIAGNOSIS — M5135 Other intervertebral disc degeneration, thoracolumbar region: Secondary | ICD-10-CM | POA: Insufficient documentation

## 2018-05-25 LAB — POCT URINALYSIS DIPSTICK
Bilirubin, UA: NEGATIVE
Blood, UA: NEGATIVE
Glucose, UA: NEGATIVE
Ketones, UA: NEGATIVE
Leukocytes, UA: NEGATIVE
Nitrite, UA: NEGATIVE
Protein, UA: NEGATIVE
Spec Grav, UA: >=1.03 — AB (ref 1.010–1.025)
Urobilinogen, UA: 0.2 E.U./dL
pH, UA: 6.5 (ref 5.0–8.0)

## 2018-05-25 MED ORDER — CYCLOBENZAPRINE HCL 10 MG PO TABS: ORAL_TABLET | ORAL | 0 refills | Status: DC

## 2018-05-25 MED ORDER — PREDNISONE 50 MG PO TABS: ORAL_TABLET | ORAL | 0 refills | Status: DC

## 2018-05-25 NOTE — Progress Notes (Signed)
Subjective:    CC: Follow-up  HPI: Shaun Camacho is a pleasant 32 year old male, couple of days ago he twisted and felt a sharp pain in his right flank.  Pain is severe, worse with position changes, particularly flexion and Valsalva.  No bowel or bladder dysfunction, saddle numbness, no constitutional symptoms.  I reviewed the past medical history, family history, social history, surgical history, and allergies today and no changes were needed.  Please see the problem list section below in epic for further details.  Past Medical History: Past Medical History:  Diagnosis Date  . Anxiety and depression 08/23/2016   08/23/2016 PHQ9 = 6, GAD7 = 7 09/12/2016 PHQ9 = 6, GAD7 = 3 10/11/2016 PHQ9 = 1, GAD7 = 1 12/27/2016 PHQ9 = 3, GAD7 = 4 01/24/2017 PHQ9 = 2, GAD7 = 5  . GERD (gastroesophageal reflux disease) 12/29/2015  . Male hypogonadism 01/12/2016   Past Surgical History: Past Surgical History:  Procedure Laterality Date  . SURGERY SCROTAL / TESTICULAR     Social History: Social History   Socioeconomic History  . Marital status: Married    Spouse name: Not on file  . Number of children: Not on file  . Years of education: Not on file  . Highest education level: Not on file  Occupational History  . Not on file  Social Needs  . Financial resource strain: Not on file  . Food insecurity:    Worry: Not on file    Inability: Not on file  . Transportation needs:    Medical: Not on file    Non-medical: Not on file  Tobacco Use  . Smoking status: Current Every Day Smoker    Packs/day: 0.50    Years: 18.00    Pack years: 9.00  . Smokeless tobacco: Current User    Types: Chew  Substance and Sexual Activity  . Alcohol use: Not on file    Comment: rare  . Drug use: No  . Sexual activity: Not on file  Lifestyle  . Physical activity:    Days per week: Not on file    Minutes per session: Not on file  . Stress: Not on file  Relationships  . Social connections:    Talks on phone: Not on  file    Gets together: Not on file    Attends religious service: Not on file    Active member of club or organization: Not on file    Attends meetings of clubs or organizations: Not on file    Relationship status: Not on file  Other Topics Concern  . Not on file  Social History Narrative  . Not on file   Family History: Family History  Problem Relation Age of Onset  . Thyroid disease Father   . Thyroid disease Sister   . Diabetes Maternal Grandmother    Allergies: Allergies  Allergen Reactions  . Tramadol Palpitations  . Meloxicam Other (See Comments)    Somnolence   Medications: See med rec.  Review of Systems: No fevers, chills, night sweats, weight loss, chest pain, or shortness of breath.   Objective:    General: Well Developed, well nourished, and in no acute distress.  Neuro: Alert and oriented x3, extra-ocular muscles intact, sensation grossly intact.  HEENT: Normocephalic, atraumatic, pupils equal round reactive to light, neck supple, no masses, no lymphadenopathy, thyroid nonpalpable.  Skin: Warm and dry, no rashes. Cardiac: Regular rate and rhythm, no murmurs rubs or gallops, no lower extremity edema.  Respiratory: Clear to auscultation bilaterally.  Not using accessory muscles, speaking in full sentences. Back Exam:  Inspection: Unremarkable  Motion: Flexion 45 deg, Extension 45 deg, Side Bending to 45 deg bilaterally,  Rotation to 45 deg bilaterally  SLR laying: Negative  XSLR laying: Negative  Palpable tenderness: None. FABER: negative. Sensory change: Gross sensation intact to all lumbar and sacral dermatomes.  Reflexes: 2+ at both patellar tendons, 2+ at achilles tendons, Babinski's downgoing.  Strength at foot  Plantar-flexion: 5/5 Dorsi-flexion: 5/5 Eversion: 5/5 Inversion: 5/5  Leg strength  Quad: 5/5 Hamstring: 5/5 Hip flexor: 5/5 Hip abductors: 5/5  Gait unremarkable.  Urinalysis is negative with the exception of a high specific  gravity.  Impression and Recommendations:    Acute right-sided low back pain Right-sided axial low back pain worse with sitting, flexion, Valsalva consistent with herniated disc. Negative urinalysis. Prednisone, Flexeril. Rehab exercises given, x-rays.  ___________________________________________ Shaun Camacho, M.D., ABFM., CAQSM. Primary Care and Sports Medicine [facility]  Adjunct Professor of Family Medicine  [employer]

## 2018-05-25 NOTE — Assessment & Plan Note (Signed)
Right-sided axial low back pain worse with sitting, flexion, Valsalva consistent with herniated disc. Negative urinalysis. Prednisone, Flexeril. Rehab exercises given, x-rays.

## 2018-05-26 ENCOUNTER — Encounter: Payer: Self-pay | Admitting: Sports Medicine

## 2018-05-29 DIAGNOSIS — R633 Feeding difficulties: Secondary | ICD-10-CM | POA: Diagnosis not present

## 2018-05-29 DIAGNOSIS — R1311 Dysphagia, oral phase: Secondary | ICD-10-CM | POA: Diagnosis not present

## 2018-06-02 DIAGNOSIS — R1311 Dysphagia, oral phase: Secondary | ICD-10-CM | POA: Diagnosis not present

## 2018-06-02 DIAGNOSIS — M5412 Radiculopathy, cervical region: Secondary | ICD-10-CM | POA: Diagnosis not present

## 2018-06-02 DIAGNOSIS — R633 Feeding difficulties: Secondary | ICD-10-CM | POA: Diagnosis not present

## 2018-06-13 ENCOUNTER — Other Ambulatory Visit: Payer: Self-pay | Admitting: Sports Medicine

## 2018-06-13 DIAGNOSIS — E291 Testicular hypofunction: Secondary | ICD-10-CM

## 2018-06-19 ENCOUNTER — Encounter: Payer: Self-pay | Admitting: Sports Medicine

## 2018-06-22 MED ORDER — HYDROCODONE-ACETAMINOPHEN 5-325 MG PO TABS: 1.0000 | ORAL_TABLET | Freq: Three times a day (TID) | ORAL | 0 refills | Status: DC | PRN

## 2018-06-23 ENCOUNTER — Ambulatory Visit (INDEPENDENT_AMBULATORY_CARE_PROVIDER_SITE_OTHER): Payer: BLUE CROSS/BLUE SHIELD | Admitting: Sports Medicine

## 2018-06-23 ENCOUNTER — Encounter: Payer: Self-pay | Admitting: Sports Medicine

## 2018-06-23 DIAGNOSIS — M545 Low back pain, unspecified: Secondary | ICD-10-CM

## 2018-06-23 DIAGNOSIS — M5412 Radiculopathy, cervical region: Secondary | ICD-10-CM

## 2018-06-23 NOTE — Assessment & Plan Note (Signed)
At this point he is scheduled for ACDF.

## 2018-06-23 NOTE — Progress Notes (Addendum)
Subjective:    CC: Follow-up  HPI: Cervical DDD: Scheduled for ACDF with Dr. Yevette Edwards.  Low back pain: Axial, discogenic, right-sided, no radiculopathy, no bowel or bladder dysfunction, saddle numbness, constitutional symptoms, persistent discomfort now for 4 weeks in spite of conservative measures.  I reviewed the past medical history, family history, social history, surgical history, and allergies today and no changes were needed.  Please see the problem list section below in epic for further details.  Past Medical History: Past Medical History:  Diagnosis Date  . Anxiety and depression 08/23/2016   08/23/2016 PHQ9 = 6, GAD7 = 7 09/12/2016 PHQ9 = 6, GAD7 = 3 10/11/2016 PHQ9 = 1, GAD7 = 1 12/27/2016 PHQ9 = 3, GAD7 = 4 01/24/2017 PHQ9 = 2, GAD7 = 5  . GERD (gastroesophageal reflux disease) 12/29/2015  . Male hypogonadism 01/12/2016   Past Surgical History: Past Surgical History:  Procedure Laterality Date  . SURGERY SCROTAL / TESTICULAR     Social History: Social History   Socioeconomic History  . Marital status: Married    Spouse name: Not on file  . Number of children: Not on file  . Years of education: Not on file  . Highest education level: Not on file  Occupational History  . Not on file  Social Needs  . Financial resource strain: Not on file  . Food insecurity:    Worry: Not on file    Inability: Not on file  . Transportation needs:    Medical: Not on file    Non-medical: Not on file  Tobacco Use  . Smoking status: Current Every Day Smoker    Packs/day: 0.50    Years: 18.00    Pack years: 9.00  . Smokeless tobacco: Current User    Types: Chew  Substance and Sexual Activity  . Alcohol use: Not on file    Comment: rare  . Drug use: No  . Sexual activity: Not on file  Lifestyle  . Physical activity:    Days per week: Not on file    Minutes per session: Not on file  . Stress: Not on file  Relationships  . Social connections:    Talks on phone: Not on file     Gets together: Not on file    Attends religious service: Not on file    Active member of club or organization: Not on file    Attends meetings of clubs or organizations: Not on file    Relationship status: Not on file  Other Topics Concern  . Not on file  Social History Narrative  . Not on file   Family History: Family History  Problem Relation Age of Onset  . Thyroid disease Father   . Thyroid disease Sister   . Diabetes Maternal Grandmother    Allergies: Allergies  Allergen Reactions  . Tramadol Palpitations  . Meloxicam Other (See Comments)    Somnolence   Medications: See med rec.  Review of Systems: No fevers, chills, night sweats, weight loss, chest pain, or shortness of breath.   Objective:    General: Well Developed, well nourished, and in no acute distress.  Neuro: Alert and oriented x3, extra-ocular muscles intact, sensation grossly intact.  HEENT: Normocephalic, atraumatic, pupils equal round reactive to light, neck supple, no masses, no lymphadenopathy, thyroid nonpalpable.  Skin: Warm and dry, no rashes. Cardiac: Regular rate and rhythm, no murmurs rubs or gallops, no lower extremity edema.  Respiratory: Clear to auscultation bilaterally. Not using accessory muscles, speaking in full  sentences.  Impression and Recommendations:    Radiculitis of right cervical region At this point he is scheduled for ACDF.  Acute right-sided low back pain Acute discogenic right-sided back pain. We have tried prednisone, therapy, persistently uncomfortable. I think it is time for interventional planning, adding an MRI. Ideally this is done this week at a [facility] facility. 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  Ordered right L4/5 interlaminar  epidural. ___________________________________________ Ihor Austinhomas J. Benjamin Stainhekkekandam, M.D., ABFM., CAQSM. Primary Care and Sports Medicine [facility]  Adjunct Professor of Family Medicine  [employer]

## 2018-06-23 NOTE — Assessment & Plan Note (Addendum)
Acute discogenic right-sided back pain. We have tried prednisone, therapy, persistently uncomfortable. I think it is time for interventional planning, adding an MRI. Ideally this is done this week at a [facility] facility. 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  Ordered right L4/5 interlaminar epidural.

## 2018-06-25 ENCOUNTER — Other Ambulatory Visit: Payer: Self-pay | Admitting: Orthopedic Surgery

## 2018-06-26 ENCOUNTER — Ambulatory Visit (HOSPITAL_COMMUNITY)
Admission: RE | Admit: 2018-06-26 | Discharge: 2018-06-26 | Disposition: A | Discharge status: Home / Self Care | Payer: BLUE CROSS/BLUE SHIELD | Source: Ambulatory Visit | Attending: Sports Medicine | Admitting: Sports Medicine

## 2018-06-26 DIAGNOSIS — M5412 Radiculopathy, cervical region: Secondary | ICD-10-CM | POA: Diagnosis not present

## 2018-06-26 DIAGNOSIS — M47896 Other spondylosis, lumbar region: Secondary | ICD-10-CM | POA: Insufficient documentation

## 2018-06-26 DIAGNOSIS — M545 Low back pain, unspecified: Secondary | ICD-10-CM

## 2018-06-26 DIAGNOSIS — M4722 Other spondylosis with radiculopathy, cervical region: Secondary | ICD-10-CM | POA: Diagnosis not present

## 2018-06-30 NOTE — Addendum Note (Signed)
Addended by: Monica BectonHEKKEKANDAM, Jocelynn Gioffre J on: 06/30/2018 10:56 AM   Modules accepted: Orders

## 2018-07-02 ENCOUNTER — Telehealth: Payer: Self-pay | Admitting: Sports Medicine

## 2018-07-02 NOTE — Telephone Encounter (Signed)
Received a message from Dr. Karie Schwalbe to call [place] [facility] for Epidural Injection. Left message with patient name and information on Roberta's line so she can schedule his epidural. Jenel LucksRoberta was asked to call back with any questions.

## 2018-07-06 NOTE — Pre-Procedure Instructions (Signed)
Shaun CardWilliam Camacho  07/06/2018      [id number] - [place],  - [address] [facility] [phone] [place] [id number] Phone: [phone] Fax: [phone]    Your procedure is scheduled on January 8  [facility] at 2:50 P.M.  Call this number if you have problems the morning of surgery:  [phone]   Remember:  Do not eat or drink after midnight.   Take these medicines the morning of surgery with A SIP OF WATER  cyclobenzaprine (FLEXERIL) if needed omeprazole (PRILOSEC)   7 days prior to surgery STOP taking any Aspirin (unless otherwise instructed by your surgeon), Aleve, Naproxen, Ibuprofen, Motrin, Advil, Goody's, BC's, all herbal medications, fish oil, and all vitamins.     Do not wear jewelry  Do not wear lotions, powders, or cologne, or deodorant.   Men may shave face and neck.  Do not bring valuables to the hospital.  [facility] is not responsible for any belongings or valuables.  Contacts, dentures or bridgework may not be worn into surgery.  Leave your suitcase in the car.  After surgery it may be brought to your room.  For patients admitted to the hospital, discharge time will be determined by your treatment team.  Patients discharged the day of surgery will not be allowed to drive home.    Special instructions:   [place]- Preparing For Surgery  Before surgery, you can play an important role. Because skin is not sterile, your skin needs to be as free of germs as possible. You can reduce the number of germs on your skin by washing with CHG (chlorahexidine gluconate) Soap before surgery.  CHG is an antiseptic cleaner which kills germs and bonds with the skin to continue killing germs even after washing.    Oral Hygiene is also important to reduce your risk of infection.  Remember - BRUSH YOUR TEETH THE MORNING OF SURGERY WITH YOUR REGULAR  TOOTHPASTE  Please do not use if you have an allergy to CHG or antibacterial soaps. If your skin becomes reddened/irritated stop using the CHG.  Do not shave (including legs and underarms) for at least 48 hours prior to first CHG shower. It is OK to shave your face.  Please follow these instructions carefully.   1. Shower the NIGHT BEFORE SURGERY and the MORNING OF SURGERY with CHG.   2. If you chose to wash your hair, wash your hair first as usual with your normal shampoo.  3. After you shampoo, rinse your hair and body thoroughly to remove the shampoo.  4. Use CHG as you would any other liquid soap. You can apply CHG directly to the skin and wash gently with a scrungie or a clean washcloth.   5. Apply the CHG Soap to your body ONLY FROM THE NECK DOWN.  Do not use on open wounds or open sores. Avoid contact with your eyes, ears, mouth and genitals (private parts). Wash Face and genitals (private parts)  with your normal soap.  6. Wash thoroughly, paying special attention to the area where your surgery will be performed.  7. Thoroughly rinse your body with warm water from the neck down.  8. DO NOT shower/wash with your normal soap after using and rinsing off the CHG Soap.  9. Pat yourself dry with a CLEAN TOWEL.  10. Wear CLEAN PAJAMAS to bed the night before surgery, wear comfortable clothes the morning of surgery  11. Place CLEAN SHEETS on your bed the night of your first shower and DO NOT SLEEP WITH PETS.    Day of Surgery:  Do not apply any deodorants/lotions.  Please wear clean clothes to the hospital/surgery center.   Remember to brush your teeth WITH YOUR REGULAR TOOTHPASTE.    Please read over the following fact sheets that you were given.

## 2018-07-07 ENCOUNTER — Encounter (HOSPITAL_COMMUNITY): Payer: Self-pay

## 2018-07-07 ENCOUNTER — Encounter (HOSPITAL_COMMUNITY)
Admission: RE | Admit: 2018-07-07 | Discharge: 2018-07-07 | Disposition: A | Discharge status: Home / Self Care | Payer: BLUE CROSS/BLUE SHIELD | Source: Ambulatory Visit | Attending: Orthopedic Surgery | Admitting: Orthopedic Surgery

## 2018-07-07 DIAGNOSIS — M542 Cervicalgia: Secondary | ICD-10-CM | POA: Diagnosis not present

## 2018-07-07 DIAGNOSIS — M79601 Pain in right arm: Secondary | ICD-10-CM | POA: Diagnosis not present

## 2018-07-07 DIAGNOSIS — M79602 Pain in left arm: Secondary | ICD-10-CM | POA: Insufficient documentation

## 2018-07-07 DIAGNOSIS — Z01812 Encounter for preprocedural laboratory examination: Secondary | ICD-10-CM | POA: Diagnosis not present

## 2018-07-07 HISTORY — DX: Unspecified osteoarthritis, unspecified site: M19.90

## 2018-07-07 LAB — COMPREHENSIVE METABOLIC PANEL
ALT: 34 U/L (ref 0–44)
AST: 27 U/L (ref 15–41)
Albumin: 4.2 g/dL (ref 3.5–5.0)
Alkaline Phosphatase: 58 U/L (ref 38–126)
Anion gap: 10 (ref 5–15)
BUN: 10 mg/dL (ref 6–20)
CO2: 25 mmol/L (ref 22–32)
Calcium: 8.9 mg/dL (ref 8.9–10.3)
Chloride: 104 mmol/L (ref 98–111)
Creatinine, Ser: 1.2 mg/dL (ref 0.61–1.24)
GFR calc Af Amer: >60 mL/min (ref >60)
GFR calc non Af Amer: >60 mL/min (ref >60)
Glucose, Bld: 97 mg/dL (ref 70–99)
Potassium: 4.2 mmol/L (ref 3.5–5.1)
Sodium: 139 mmol/L (ref 135–145)
Total Bilirubin: 0.7 mg/dL (ref 0.3–1.2)
Total Protein: 6.7 g/dL (ref 6.5–8.1)

## 2018-07-07 LAB — CBC WITH DIFFERENTIAL/PLATELET
Abs Immature Granulocytes: 0.04 10*3/uL (ref 0.00–0.07)
Basophils Absolute: 0 10*3/uL (ref 0.0–0.1)
Basophils Relative: 0 %
Eosinophils Absolute: 0.4 10*3/uL (ref 0.0–0.5)
Eosinophils Relative: 4 %
HCT: 47.9 % (ref 39.0–52.0)
Hemoglobin: 16.2 g/dL (ref 13.0–17.0)
Immature Granulocytes: 0 %
Lymphocytes Relative: 22 %
Lymphs Abs: 2 10*3/uL (ref 0.7–4.0)
MCH: 30.2 pg (ref 26.0–34.0)
MCHC: 33.8 g/dL (ref 30.0–36.0)
MCV: 89.4 fL (ref 80.0–100.0)
Monocytes Absolute: 0.6 10*3/uL (ref 0.1–1.0)
Monocytes Relative: 7 %
Neutro Abs: 5.9 10*3/uL (ref 1.7–7.7)
Neutrophils Relative %: 67 %
Platelets: 257 10*3/uL (ref 150–400)
RBC: 5.36 MIL/uL (ref 4.22–5.81)
RDW: 12.4 % (ref 11.5–15.5)
WBC: 8.9 10*3/uL (ref 4.0–10.5)
nRBC: 0 % (ref 0.0–0.2)

## 2018-07-07 LAB — SURGICAL PCR SCREEN
MRSA, PCR: NEGATIVE
Staphylococcus aureus: NEGATIVE

## 2018-07-07 LAB — URINALYSIS, ROUTINE W REFLEX MICROSCOPIC
Bilirubin Urine: NEGATIVE
Glucose, UA: NEGATIVE mg/dL
Hgb urine dipstick: NEGATIVE
Ketones, ur: NEGATIVE mg/dL
Leukocytes, UA: NEGATIVE
Nitrite: NEGATIVE
Protein, ur: NEGATIVE mg/dL
Specific Gravity, Urine: 1.016 (ref 1.005–1.030)
pH: 6 (ref 5.0–8.0)

## 2018-07-07 LAB — TYPE AND SCREEN
ABO/RH(D): B NEG
Antibody Screen: NEGATIVE

## 2018-07-07 LAB — PROTIME-INR
INR: 0.97
Prothrombin Time: 12.8 seconds (ref 11.4–15.2)

## 2018-07-07 LAB — ABO/RH: ABO/RH(D): B NEG

## 2018-07-07 LAB — APTT: aPTT: 26 seconds (ref 24–36)

## 2018-07-09 ENCOUNTER — Encounter: Payer: Self-pay | Admitting: Sports Medicine

## 2018-07-09 MED ORDER — DOXYCYCLINE HYCLATE 100 MG PO TABS: 100.0000 mg | ORAL_TABLET | Freq: Two times a day (BID) | ORAL | 0 refills | Status: AC

## 2018-07-10 ENCOUNTER — Encounter: Payer: Self-pay | Admitting: Physician Assistant

## 2018-07-10 ENCOUNTER — Ambulatory Visit (INDEPENDENT_AMBULATORY_CARE_PROVIDER_SITE_OTHER): Payer: BLUE CROSS/BLUE SHIELD | Admitting: Physician Assistant

## 2018-07-10 VITALS — BP 123/72 | HR 61 | Temp 98.2°F | Wt 213.8 lb

## 2018-07-10 DIAGNOSIS — L0592 Pilonidal sinus without abscess: Secondary | ICD-10-CM | POA: Diagnosis not present

## 2018-07-10 DIAGNOSIS — K610 Anal abscess: Secondary | ICD-10-CM | POA: Diagnosis not present

## 2018-07-10 NOTE — Progress Notes (Signed)
HPI:                                                                Shaun Camacho is a 33 y.o. male who presents to [name]: Primary Care Sports Medicine today for skin infection/?abscess  Patient with history of pilonidal cyst presents today with 1 week of pain and inflammation of his right buttock. Denies fever, chills, malaise. He has upcoming neck surgery on 07/15/18. His PCP called in Doxycycline for him yesterday, but he has not started this yet. He has been applying warm compresses.   Past Medical History:  Diagnosis Date  . Anxiety and depression 08/23/2016   08/23/2016 PHQ9 = 6, GAD7 = 7 09/12/2016 PHQ9 = 6, GAD7 = 3 10/11/2016 PHQ9 = 1, GAD7 = 1 12/27/2016 PHQ9 = 3, GAD7 = 4 01/24/2017 PHQ9 = 2, GAD7 = 5  . Arthritis   . GERD (gastroesophageal reflux disease) 12/29/2015  . Male hypogonadism 01/12/2016   Past Surgical History:  Procedure Laterality Date  . SURGERY SCROTAL / TESTICULAR     Social History   Tobacco Use  . Smoking status: Current Every Day Smoker    Packs/day: 0.50    Years: 18.00    Pack years: 9.00  . Smokeless tobacco: Current User    Types: Chew  Substance Use Topics  . Alcohol use: Not on file    Comment: rare   family history includes Diabetes in his maternal grandmother; Thyroid disease in his father and sister.    ROS: negative except as noted in the HPI  Medications: Current Outpatient Medications  Medication Sig Dispense Refill  . B-D 3CC LUER-LOK SYR 22GX1-1/2 22G X 1-1/2" 3 ML MISC USE AS DIRECTED 10 each 0  . BD HYPODERMIC NEEDLE 18G X 1" MISC USE AS DIRECTED 10 each 0  . cyclobenzaprine (FLEXERIL) 10 MG tablet One half tab PO qHS, then increase gradually to one tab TID. (Patient taking differently: Take 10 mg by mouth at bedtime as needed for muscle spasms. One half tab PO qHS, then increase gradually to one tab TID.) 30 tablet 0  . doxycycline (VIBRA-TABS) 100 MG tablet Take 1 tablet (100 mg total) by mouth  2 (two) times daily for 7 days. 14 tablet 0  . HYDROcodone-acetaminophen (NORCO/VICODIN) 5-325 MG tablet Take 1 tablet by mouth every 8 (eight) hours as needed for moderate pain. 15 tablet 0  . ibuprofen (ADVIL,MOTRIN) 200 MG tablet Take 800-1,000 mg by mouth every 8 (eight) hours as needed (pain).    Marland Kitchen omeprazole (PRILOSEC) 40 MG capsule TAKE 1 CAPSULE BY MOUTH EVERY DAY 90 capsule 0  . testosterone cypionate (DEPOTESTOSTERONE CYPIONATE) 200 MG/ML injection INJECT 1 ML IN THE MUSCLE EVERY 14 DAYS (Patient taking differently: Inject 100 mg into the muscle every 14 (fourteen) days. ) 4 mL 0   No current facility-administered medications for this visit.    Allergies  Allergen Reactions  . Tramadol Palpitations  . Meloxicam Other (See Comments)    Somnolence       Objective:  BP 123/72 (BP Location: Left Arm, Patient Position: Sitting, Cuff Size: Normal)   Pulse 61   Temp 98.2 F (36.8 C) (Oral)   Wt 213 lb 12.8 oz (97 kg)   BMI 30.68  kg/m  Gen:  alert, not ill-appearing, no distress, appropriate for age, obese male HEENT: head normocephalic without obvious abnormality, conjunctiva and cornea clear, trachea midline Pulm: Normal work of breathing, normal phonation  Neuro: alert and oriented x 3, no tremor MSK: extremities atraumatic, normal gait and station Skin: pilonidal sinus of superior gluteal cleft, no drainage, adjacent right buttock there is approx 1.5 cm area of induration,no fluctuance or redness   No results found for this or any previous visit (from the past 72 hour(s)). No results found.    Assessment and Plan: 33 y.o. male with   .Shaun Camacho was seen today for cyst.  Diagnoses and all orders for this visit:  Pilonidal sinus  Perianal abscess   Spoke with patient's PCP Dr. Rodney Langtonhomas Thekkekandam, who advised against incision & drainage at this time. Recommended he start Doxycyline and continue warm compresses Recommended he make his surgeon aware      Patient education and anticipatory guidance given Patient agrees with treatment plan Follow-up as needed if symptoms worsen or fail to improve  Levonne Hubertharley E. Stephen Turnbaugh PA-C

## 2018-07-13 ENCOUNTER — Ambulatory Visit
Admission: RE | Admit: 2018-07-13 | Discharge: 2018-07-13 | Disposition: A | Discharge status: Home / Self Care | Payer: BLUE CROSS/BLUE SHIELD | Source: Ambulatory Visit | Attending: Sports Medicine | Admitting: Sports Medicine

## 2018-07-13 DIAGNOSIS — M545 Low back pain: Secondary | ICD-10-CM | POA: Diagnosis not present

## 2018-07-13 MED ORDER — IOPAMIDOL (ISOVUE-M 200) INJECTION 41%
1.0000 mL | Freq: Once | INTRAMUSCULAR | Status: AC
  Administered 2018-07-13: 1 mL via EPIDURAL

## 2018-07-13 MED ORDER — METHYLPREDNISOLONE ACETATE 40 MG/ML INJ SUSP (RADIOLOG
120.0000 mg | Freq: Once | INTRAMUSCULAR | Status: AC
  Administered 2018-07-13: 120 mg via EPIDURAL

## 2018-07-14 NOTE — Progress Notes (Signed)
Called pt with time change and was telling to arrive a 12:45 PM for a 3:45 PM surgery, he interrupted me stating "oh no ma'am you're wrong. The surgery scheduler from Dr. Marshell Levan office called me and told me to arrive at 9:30 AM for a 12:30 PM surgery, you must not have the right information". I told him to follow their instructions.

## 2018-07-14 NOTE — Progress Notes (Signed)
Notified Lupita Leash that patient said he was instructed to arrive at 0930 but surgery is not scheduled until 1545.  Lupita Leash stated that she would reach out to patient with a new arrival time.

## 2018-07-15 ENCOUNTER — Other Ambulatory Visit: Payer: Self-pay

## 2018-07-15 ENCOUNTER — Ambulatory Visit (HOSPITAL_COMMUNITY): Payer: BLUE CROSS/BLUE SHIELD | Admitting: Anesthesiology

## 2018-07-15 ENCOUNTER — Encounter (HOSPITAL_COMMUNITY): Payer: Self-pay | Admitting: General Practice

## 2018-07-15 ENCOUNTER — Observation Stay (HOSPITAL_COMMUNITY)
Admission: RE | Admit: 2018-07-15 | Discharge: 2018-07-16 | Disposition: A | Discharge status: Home / Self Care | Payer: BLUE CROSS/BLUE SHIELD | Attending: Orthopedic Surgery | Admitting: Orthopedic Surgery

## 2018-07-15 ENCOUNTER — Ambulatory Visit (HOSPITAL_COMMUNITY): Payer: BLUE CROSS/BLUE SHIELD

## 2018-07-15 ENCOUNTER — Encounter (HOSPITAL_COMMUNITY)
Admission: RE | Disposition: A | Discharge status: Home / Self Care | Payer: Self-pay | Source: Home / Self Care | Attending: Orthopedic Surgery

## 2018-07-15 DIAGNOSIS — Z79899 Other long term (current) drug therapy: Secondary | ICD-10-CM | POA: Insufficient documentation

## 2018-07-15 DIAGNOSIS — Z419 Encounter for procedure for purposes other than remedying health state, unspecified: Secondary | ICD-10-CM

## 2018-07-15 DIAGNOSIS — M4802 Spinal stenosis, cervical region: Secondary | ICD-10-CM | POA: Insufficient documentation

## 2018-07-15 DIAGNOSIS — K219 Gastro-esophageal reflux disease without esophagitis: Secondary | ICD-10-CM | POA: Insufficient documentation

## 2018-07-15 DIAGNOSIS — F17210 Nicotine dependence, cigarettes, uncomplicated: Secondary | ICD-10-CM | POA: Insufficient documentation

## 2018-07-15 DIAGNOSIS — Z79890 Hormone replacement therapy (postmenopausal): Secondary | ICD-10-CM | POA: Diagnosis not present

## 2018-07-15 DIAGNOSIS — E291 Testicular hypofunction: Secondary | ICD-10-CM | POA: Diagnosis not present

## 2018-07-15 DIAGNOSIS — M5412 Radiculopathy, cervical region: Secondary | ICD-10-CM | POA: Diagnosis not present

## 2018-07-15 DIAGNOSIS — M5410 Radiculopathy, site unspecified: Secondary | ICD-10-CM | POA: Diagnosis present

## 2018-07-15 HISTORY — PX: ANTERIOR CERVICAL DECOMP/DISCECTOMY FUSION: SHX1161

## 2018-07-15 SURGERY — ANTERIOR CERVICAL DECOMPRESSION/DISCECTOMY FUSION 2 LEVELS
Anesthesia: General | Site: Neck

## 2018-07-15 MED ORDER — SENNOSIDES-DOCUSATE SODIUM 8.6-50 MG PO TABS: 1.0000 | ORAL_TABLET | Freq: Every evening | ORAL | Status: DC | PRN

## 2018-07-15 MED ORDER — LIDOCAINE 2% (20 MG/ML) 5 ML SYRINGE
INTRAMUSCULAR | Status: DC | PRN
  Administered 2018-07-15: 80 mg via INTRAVENOUS

## 2018-07-15 MED ORDER — ROCURONIUM BROMIDE 50 MG/5ML IV SOSY
PREFILLED_SYRINGE | INTRAVENOUS | Status: DC | PRN
  Administered 2018-07-15: 10 mg via INTRAVENOUS
  Administered 2018-07-15: 50 mg via INTRAVENOUS

## 2018-07-15 MED ORDER — PANTOPRAZOLE SODIUM 40 MG PO TBEC
80.0000 mg | DELAYED_RELEASE_TABLET | Freq: Every day | ORAL | Status: DC
  Administered 2018-07-16: 80 mg via ORAL
  Filled 2018-07-15 (×2): qty 2

## 2018-07-15 MED ORDER — BUPIVACAINE-EPINEPHRINE (PF) 0.25% -1:200000 IJ SOLN
INTRAMUSCULAR | Status: AC
  Filled 2018-07-15: qty 30

## 2018-07-15 MED ORDER — FLEET ENEMA 7-19 GM/118ML RE ENEM: 1.0000 | ENEMA | Freq: Once | RECTAL | Status: DC | PRN

## 2018-07-15 MED ORDER — PROPOFOL 10 MG/ML IV BOLUS
INTRAVENOUS | Status: DC | PRN
  Administered 2018-07-15: 200 mg via INTRAVENOUS

## 2018-07-15 MED ORDER — HEMOSTATIC AGENTS (NO CHARGE) OPTIME
TOPICAL | Status: DC | PRN
  Administered 2018-07-15: 1 via TOPICAL

## 2018-07-15 MED ORDER — SODIUM CHLORIDE 0.9% FLUSH: 3.0000 mL | INTRAVENOUS | Status: DC | PRN

## 2018-07-15 MED ORDER — ONDANSETRON HCL 4 MG PO TABS: 4.0000 mg | ORAL_TABLET | Freq: Four times a day (QID) | ORAL | Status: DC | PRN

## 2018-07-15 MED ORDER — THROMBIN 20000 UNITS EX SOLR
CUTANEOUS | Status: DC | PRN
  Administered 2018-07-15: 16:00:00 via TOPICAL

## 2018-07-15 MED ORDER — ACETAMINOPHEN 325 MG PO TABS: 650.0000 mg | ORAL_TABLET | ORAL | Status: DC | PRN

## 2018-07-15 MED ORDER — 0.9 % SODIUM CHLORIDE (POUR BTL) OPTIME
TOPICAL | Status: DC | PRN
  Administered 2018-07-15 (×2): 1000 mL

## 2018-07-15 MED ORDER — SUGAMMADEX SODIUM 200 MG/2ML IV SOLN
INTRAVENOUS | Status: DC | PRN
  Administered 2018-07-15: 200 mg via INTRAVENOUS

## 2018-07-15 MED ORDER — DOCUSATE SODIUM 100 MG PO CAPS
100.0000 mg | ORAL_CAPSULE | Freq: Two times a day (BID) | ORAL | Status: DC
  Administered 2018-07-15 – 2018-07-16 (×2): 100 mg via ORAL
  Filled 2018-07-15 (×2): qty 1

## 2018-07-15 MED ORDER — FENTANYL CITRATE (PF) 100 MCG/2ML IJ SOLN
INTRAMUSCULAR | Status: DC | PRN
  Administered 2018-07-15 (×4): 50 ug via INTRAVENOUS
  Administered 2018-07-15: 100 ug via INTRAVENOUS
  Administered 2018-07-15: 50 ug via INTRAVENOUS
  Administered 2018-07-15: 100 ug via INTRAVENOUS

## 2018-07-15 MED ORDER — CEFAZOLIN SODIUM-DEXTROSE 2-4 GM/100ML-% IV SOLN
2.0000 g | Freq: Three times a day (TID) | INTRAVENOUS | Status: AC
  Administered 2018-07-15 – 2018-07-16 (×2): 2 g via INTRAVENOUS
  Filled 2018-07-15 (×2): qty 100

## 2018-07-15 MED ORDER — SODIUM CHLORIDE 0.9 % IV SOLN: 250.0000 mL | INTRAVENOUS | Status: DC

## 2018-07-15 MED ORDER — ONDANSETRON HCL 4 MG/2ML IJ SOLN
INTRAMUSCULAR | Status: DC | PRN
  Administered 2018-07-15: 4 mg via INTRAVENOUS

## 2018-07-15 MED ORDER — DOXYCYCLINE HYCLATE 100 MG PO TABS
100.0000 mg | ORAL_TABLET | Freq: Two times a day (BID) | ORAL | Status: DC
  Administered 2018-07-15 – 2018-07-16 (×2): 100 mg via ORAL
  Filled 2018-07-15 (×2): qty 1

## 2018-07-15 MED ORDER — ONDANSETRON HCL 4 MG/2ML IJ SOLN: 4.0000 mg | Freq: Once | INTRAMUSCULAR | Status: DC | PRN

## 2018-07-15 MED ORDER — TESTOSTERONE CYPIONATE 200 MG/ML IM SOLN: 100.0000 mg | INTRAMUSCULAR | Status: DC

## 2018-07-15 MED ORDER — FENTANYL CITRATE (PF) 250 MCG/5ML IJ SOLN
INTRAMUSCULAR | Status: AC
  Filled 2018-07-15: qty 5

## 2018-07-15 MED ORDER — DEXMEDETOMIDINE HCL 200 MCG/2ML IV SOLN
INTRAVENOUS | Status: DC | PRN
  Administered 2018-07-15 (×4): 4 ug via INTRAVENOUS

## 2018-07-15 MED ORDER — BISACODYL 5 MG PO TBEC: 5.0000 mg | DELAYED_RELEASE_TABLET | Freq: Every day | ORAL | Status: DC | PRN

## 2018-07-15 MED ORDER — DIAZEPAM 5 MG PO TABS
5.0000 mg | ORAL_TABLET | Freq: Four times a day (QID) | ORAL | Status: DC | PRN
  Administered 2018-07-15: 5 mg via ORAL
  Filled 2018-07-15: qty 1

## 2018-07-15 MED ORDER — MEPERIDINE HCL 50 MG/ML IJ SOLN: 6.2500 mg | INTRAMUSCULAR | Status: DC | PRN

## 2018-07-15 MED ORDER — HYDROMORPHONE HCL 1 MG/ML IJ SOLN: 0.2500 mg | INTRAMUSCULAR | Status: DC | PRN

## 2018-07-15 MED ORDER — LACTATED RINGERS IV SOLN
INTRAVENOUS | Status: DC
  Administered 2018-07-15 (×3): via INTRAVENOUS

## 2018-07-15 MED ORDER — ZOLPIDEM TARTRATE 5 MG PO TABS: 5.0000 mg | ORAL_TABLET | Freq: Every evening | ORAL | Status: DC | PRN

## 2018-07-15 MED ORDER — HYDROMORPHONE HCL 1 MG/ML IJ SOLN
0.5000 mg | INTRAMUSCULAR | Status: DC | PRN
  Administered 2018-07-15 – 2018-07-16 (×3): 0.5 mg via INTRAVENOUS
  Filled 2018-07-15 (×3): qty 1

## 2018-07-15 MED ORDER — OXYCODONE-ACETAMINOPHEN 5-325 MG PO TABS
1.0000 | ORAL_TABLET | ORAL | Status: DC | PRN
  Administered 2018-07-15 – 2018-07-16 (×4): 2 via ORAL
  Filled 2018-07-15 (×3): qty 2

## 2018-07-15 MED ORDER — ACETAMINOPHEN 650 MG RE SUPP: 650.0000 mg | RECTAL | Status: DC | PRN

## 2018-07-15 MED ORDER — THROMBIN 20000 UNITS EX SOLR
CUTANEOUS | Status: AC
  Filled 2018-07-15: qty 20000

## 2018-07-15 MED ORDER — CEFAZOLIN SODIUM-DEXTROSE 2-4 GM/100ML-% IV SOLN
2.0000 g | INTRAVENOUS | Status: AC
  Administered 2018-07-15: 2 g via INTRAVENOUS
  Filled 2018-07-15: qty 100

## 2018-07-15 MED ORDER — PHENOL 1.4 % MT LIQD
1.0000 | OROMUCOSAL | Status: DC | PRN
  Filled 2018-07-15: qty 177

## 2018-07-15 MED ORDER — OXYCODONE-ACETAMINOPHEN 5-325 MG PO TABS
ORAL_TABLET | ORAL | Status: AC
  Filled 2018-07-15: qty 2

## 2018-07-15 MED ORDER — ALUM & MAG HYDROXIDE-SIMETH 200-200-20 MG/5ML PO SUSP: 30.0000 mL | Freq: Four times a day (QID) | ORAL | Status: DC | PRN

## 2018-07-15 MED ORDER — BUPIVACAINE-EPINEPHRINE 0.25% -1:200000 IJ SOLN
INTRAMUSCULAR | Status: DC | PRN
  Administered 2018-07-15: 7 mL

## 2018-07-15 MED ORDER — ONDANSETRON HCL 4 MG/2ML IJ SOLN: 4.0000 mg | Freq: Four times a day (QID) | INTRAMUSCULAR | Status: DC | PRN

## 2018-07-15 MED ORDER — DEXAMETHASONE SODIUM PHOSPHATE 10 MG/ML IJ SOLN
INTRAMUSCULAR | Status: DC | PRN
  Administered 2018-07-15: 10 mg via INTRAVENOUS

## 2018-07-15 MED ORDER — POVIDONE-IODINE 7.5 % EX SOLN: Freq: Once | CUTANEOUS | Status: DC

## 2018-07-15 MED ORDER — MIDAZOLAM HCL 5 MG/5ML IJ SOLN
INTRAMUSCULAR | Status: DC | PRN
  Administered 2018-07-15: 2 mg via INTRAVENOUS

## 2018-07-15 MED ORDER — SODIUM CHLORIDE 0.9% FLUSH
3.0000 mL | Freq: Two times a day (BID) | INTRAVENOUS | Status: DC
  Administered 2018-07-15 – 2018-07-16 (×2): 3 mL via INTRAVENOUS

## 2018-07-15 MED ORDER — MENTHOL 3 MG MT LOZG
1.0000 | LOZENGE | OROMUCOSAL | Status: DC | PRN
  Administered 2018-07-15: 3 mg via ORAL
  Filled 2018-07-15: qty 9

## 2018-07-15 SURGICAL SUPPLY — 83 items
BENZOIN TINCTURE PRP APPL 2/3 (GAUZE/BANDAGES/DRESSINGS) ×2 IMPLANT
BIT DRILL NEURO 2X3.1 SFT TUCH (MISCELLANEOUS) ×1 IMPLANT
BIT DRILL SRG 14X2.2XFLT CHK (BIT) IMPLANT
BIT DRL SRG 14X2.2XFLT CHK (BIT) ×1
BLADE CLIPPER SURG (BLADE) ×2 IMPLANT
BLADE SURG 15 STRL LF DISP TIS (BLADE) ×1 IMPLANT
BLADE SURG 15 STRL SS (BLADE) ×1
BONE VIVIGEN FORMABLE 1.3CC (Bone Implant) ×4 IMPLANT
BUR MATCHSTICK NEURO 3.0 LAGG (BURR) IMPLANT
CARTRIDGE OIL MAESTRO DRILL (MISCELLANEOUS) ×1 IMPLANT
COLLAR CERV LO CONTOUR FIRM DE (SOFTGOODS) IMPLANT
CORDS BIPOLAR (ELECTRODE) ×2 IMPLANT
COVER SURGICAL LIGHT HANDLE (MISCELLANEOUS) ×1 IMPLANT
COVER WAND RF STERILE (DRAPES) ×1 IMPLANT
CRADLE DONUT ADULT HEAD (MISCELLANEOUS) ×2 IMPLANT
DECANTER SPIKE VIAL GLASS SM (MISCELLANEOUS) ×2 IMPLANT
DIFFUSER DRILL AIR PNEUMATIC (MISCELLANEOUS) ×2 IMPLANT
DRAIN JACKSON RD 7FR 3/32 (WOUND CARE) IMPLANT
DRAPE C-ARM 42X72 X-RAY (DRAPES) ×2 IMPLANT
DRAPE POUCH INSTRU U-SHP 10X18 (DRAPES) ×2 IMPLANT
DRAPE SURG 17X23 STRL (DRAPES) ×8 IMPLANT
DRILL BIT SKYLINE 14MM (BIT) ×1
DRILL NEURO 2X3.1 SOFT TOUCH (MISCELLANEOUS) ×2
DURAPREP 26ML APPLICATOR (WOUND CARE) ×2 IMPLANT
ELECT COATED BLADE 2.86 ST (ELECTRODE) ×2 IMPLANT
ELECT REM PT RETURN 9FT ADLT (ELECTROSURGICAL) ×2
ELECTRODE REM PT RTRN 9FT ADLT (ELECTROSURGICAL) ×1 IMPLANT
EVACUATOR SILICONE 100CC (DRAIN) IMPLANT
GAUZE 4X4 16PLY RFD (DISPOSABLE) ×3 IMPLANT
GAUZE SPONGE 4X4 12PLY STRL (GAUZE/BANDAGES/DRESSINGS) ×2 IMPLANT
GLOVE BIO SURGEON STRL SZ 6.5 (GLOVE) ×4 IMPLANT
GLOVE BIO SURGEON STRL SZ7 (GLOVE) ×3 IMPLANT
GLOVE BIO SURGEON STRL SZ8 (GLOVE) ×3 IMPLANT
GLOVE BIOGEL PI IND STRL 6.5 (GLOVE) IMPLANT
GLOVE BIOGEL PI IND STRL 7.0 (GLOVE) ×2 IMPLANT
GLOVE BIOGEL PI IND STRL 8 (GLOVE) ×1 IMPLANT
GLOVE BIOGEL PI INDICATOR 6.5 (GLOVE) ×3
GLOVE BIOGEL PI INDICATOR 7.0 (GLOVE) ×2
GLOVE BIOGEL PI INDICATOR 8 (GLOVE) ×3
GLOVE ECLIPSE 7.5 STRL STRAW (GLOVE) ×3 IMPLANT
GOWN STRL REUS W/ TWL LRG LVL3 (GOWN DISPOSABLE) ×1 IMPLANT
GOWN STRL REUS W/ TWL XL LVL3 (GOWN DISPOSABLE) ×1 IMPLANT
GOWN STRL REUS W/TWL LRG LVL3 (GOWN DISPOSABLE) ×3
GOWN STRL REUS W/TWL XL LVL3 (GOWN DISPOSABLE) ×2
GRAFT BNE MATRIX VG FRMBL SM 1 (Bone Implant) IMPLANT
INTERLOCK LRDTC CRVCL VBR 7MM (Bone Implant) IMPLANT
IV CATH 14GX2 1/4 (CATHETERS) ×2 IMPLANT
KIT BASIN OR (CUSTOM PROCEDURE TRAY) ×2 IMPLANT
KIT TURNOVER KIT B (KITS) ×2 IMPLANT
LORDOTIC CERVICAL VBR 7MM SM (Bone Implant) ×4 IMPLANT
MANIFOLD NEPTUNE II (INSTRUMENTS) ×1 IMPLANT
NDL PRECISIONGLIDE 27X1.5 (NEEDLE) ×1 IMPLANT
NDL SPNL 20GX3.5 QUINCKE YW (NEEDLE) ×1 IMPLANT
NEEDLE PRECISIONGLIDE 27X1.5 (NEEDLE) ×2 IMPLANT
NEEDLE SPNL 20GX3.5 QUINCKE YW (NEEDLE) ×2 IMPLANT
NS IRRIG 1000ML POUR BTL (IV SOLUTION) ×2 IMPLANT
OIL CARTRIDGE MAESTRO DRILL (MISCELLANEOUS) ×2
PACK ORTHO CERVICAL (CUSTOM PROCEDURE TRAY) ×2 IMPLANT
PAD ARMBOARD 7.5X6 YLW CONV (MISCELLANEOUS) ×4 IMPLANT
PATTIES SURGICAL .5 X.5 (GAUZE/BANDAGES/DRESSINGS) ×2 IMPLANT
PATTIES SURGICAL .5 X1 (DISPOSABLE) ×2 IMPLANT
PIN DISTRACTION 14 (PIN) ×2 IMPLANT
PLATE SKYLINE TWO LEVEL 28MM (Plate) ×1 IMPLANT
SCREW SKYLINE VAR OS 14MM (Screw) ×6 IMPLANT
SPOGE SURGIFLO 8M (HEMOSTASIS) ×1
SPONGE INTESTINAL PEANUT (DISPOSABLE) ×3 IMPLANT
SPONGE SURGIFLO 8M (HEMOSTASIS) IMPLANT
SPONGE SURGIFOAM ABS GEL 100 (HEMOSTASIS) ×2 IMPLANT
STRIP CLOSURE SKIN 1/2X4 (GAUZE/BANDAGES/DRESSINGS) ×2 IMPLANT
SURGIFLO W/THROMBIN 8M KIT (HEMOSTASIS) IMPLANT
SUT MNCRL AB 4-0 PS2 18 (SUTURE) ×2 IMPLANT
SUT SILK 4 0 (SUTURE)
SUT SILK 4-0 18XBRD TIE 12 (SUTURE) IMPLANT
SUT VIC AB 2-0 CT2 18 VCP726D (SUTURE) ×2 IMPLANT
SYR BULB IRRIGATION 50ML (SYRINGE) ×2 IMPLANT
SYR CONTROL 10ML LL (SYRINGE) ×6 IMPLANT
TAPE CLOTH 4X10 WHT NS (GAUZE/BANDAGES/DRESSINGS) ×2 IMPLANT
TAPE CLOTH SURG 4X10 WHT LF (GAUZE/BANDAGES/DRESSINGS) ×1 IMPLANT
TAPE UMBILICAL COTTON 1/8X30 (MISCELLANEOUS) ×2 IMPLANT
TOWEL OR 17X24 6PK STRL BLUE (TOWEL DISPOSABLE) ×2 IMPLANT
TOWEL OR 17X26 10 PK STRL BLUE (TOWEL DISPOSABLE) ×2 IMPLANT
WATER STERILE IRR 1000ML POUR (IV SOLUTION) ×2 IMPLANT
YANKAUER SUCT BULB TIP NO VENT (SUCTIONS) ×2 IMPLANT

## 2018-07-15 NOTE — H&P (Signed)
PREOPERATIVE H&P  Chief Complaint: Bilateral arm pain, right > left  HPI: Shaun Camacho is a 33 y.o. male who presents with ongoing pain in the bilateral arms  MRI reveals stenosis spanning C5-C7  Patient has failed multiple forms of conservative care and continues to have pain (see office notes for additional details regarding the patient's full course of treatment)  Past Medical History:  Diagnosis Date  . Anxiety and depression 08/23/2016   08/23/2016 PHQ9 = 6, GAD7 = 7 09/12/2016 PHQ9 = 6, GAD7 = 3 10/11/2016 PHQ9 = 1, GAD7 = 1 12/27/2016 PHQ9 = 3, GAD7 = 4 01/24/2017 PHQ9 = 2, GAD7 = 5  . Arthritis   . GERD (gastroesophageal reflux disease) 12/29/2015  . Male hypogonadism 01/12/2016   Past Surgical History:  Procedure Laterality Date  . SURGERY SCROTAL / TESTICULAR     Social History   Socioeconomic History  . Marital status: Married    Spouse name: Not on file  . Number of children: Not on file  . Years of education: Not on file  . Highest education level: Not on file  Occupational History  . Not on file  Social Needs  . Financial resource strain: Not on file  . Food insecurity:    Worry: Not on file    Inability: Not on file  . Transportation needs:    Medical: Not on file    Non-medical: Not on file  Tobacco Use  . Smoking status: Current Every Day Smoker    Packs/day: 0.50    Years: 18.00    Pack years: 9.00  . Smokeless tobacco: Current User    Types: Chew  Substance and Sexual Activity  . Alcohol use: Not on file    Comment: rare  . Drug use: No  . Sexual activity: Not on file  Lifestyle  . Physical activity:    Days per week: Not on file    Minutes per session: Not on file  . Stress: Not on file  Relationships  . Social connections:    Talks on phone: Not on file    Gets together: Not on file    Attends religious service: Not on file    Active member of club or organization: Not on file    Attends meetings of clubs or organizations:  Not on file    Relationship status: Not on file  Other Topics Concern  . Not on file  Social History Narrative  . Not on file   Family History  Problem Relation Age of Onset  . Thyroid disease Father   . Thyroid disease Sister   . Diabetes Maternal Grandmother    Allergies  Allergen Reactions  . Tramadol Palpitations  . Meloxicam Other (See Comments)    Somnolence   Prior to Admission medications   Medication Sig Start Date End Date Taking? Authorizing Provider  cyclobenzaprine (FLEXERIL) 10 MG tablet One half tab PO qHS, then increase gradually to one tab TID. Patient taking differently: Take 10 mg by mouth at bedtime as needed for muscle spasms. One half tab PO qHS, then increase gradually to one tab TID. 05/25/18  Yes Monica Becton, MD  HYDROcodone-acetaminophen (NORCO/VICODIN) 5-325 MG tablet Take 1 tablet by mouth every 8 (eight) hours as needed for moderate pain. 06/22/18  Yes Monica Becton, MD  ibuprofen (ADVIL,MOTRIN) 200 MG tablet Take 800-1,000 mg by mouth every 8 (eight) hours as needed (pain).   Yes [provider]  omeprazole (PRILOSEC) 40  MG capsule TAKE 1 CAPSULE BY MOUTH EVERY DAY 05/06/18  Yes Monica Becton, MD  testosterone cypionate (DEPOTESTOSTERONE CYPIONATE) 200 MG/ML injection INJECT 1 ML IN THE MUSCLE EVERY 14 DAYS Patient taking differently: Inject 100 mg into the muscle every 14 (fourteen) days.  06/14/18  Yes Monica Becton, MD  B-D 3CC LUER-LOK SYR 22GX1-1/2 22G X 1-1/2" 3 ML MISC USE AS DIRECTED 05/16/18   Monica Becton, MD  BD HYPODERMIC NEEDLE 18G X 1" MISC USE AS DIRECTED 05/16/18   Monica Becton, MD  doxycycline (VIBRA-TABS) 100 MG tablet Take 1 tablet (100 mg total) by mouth 2 (two) times daily for 7 days. 07/09/18 07/16/18  Monica Becton, MD     All other systems have been reviewed and were otherwise negative with the exception of those mentioned in the HPI and as above.  Physical  Exam: There were no vitals filed for this visit.  There is no height or weight on file to calculate BMI.  General: Alert, no acute distress Cardiovascular: No pedal edema Respiratory: No cyanosis, no use of accessory musculature Skin: No lesions in the area of chief complaint Neurologic: Sensation intact distally Psychiatric: Patient is competent for consent with normal mood and affect Lymphatic: No axillary or cervical lymphadenopathy  Assessment/Plan: NECK AND BILATERAL ARM PAIN, RIGHT GREATER THAN LEFT  Plan for Procedure(s): ANTERIOR CERVICAL DECOMPRESSION FUSION, CERVICAL 5-6, CERVICAL 6-7 WITH INSTRUMENTATION AND ALLOGRAFT   Jackelyn Hoehn, MD 07/15/2018 8:27 AM

## 2018-07-15 NOTE — Anesthesia Preprocedure Evaluation (Addendum)
Anesthesia Evaluation  Patient identified by MRN, date of birth, ID band Patient awake    Reviewed: Allergy & Precautions, NPO status , Patient's Chart, lab work & pertinent test results  Airway Mallampati: II  TM Distance: >3 FB Neck ROM: full    Dental  (+) Teeth Intact, Dental Advidsory Given   Pulmonary Current Smoker,    Pulmonary exam normal        Cardiovascular Normal cardiovascular exam Rhythm:regular Rate:Normal     Neuro/Psych PSYCHIATRIC DISORDERS Anxiety Depression  Neuromuscular disease    GI/Hepatic GERD  Medicated and Controlled,  Endo/Other    Renal/GU      Musculoskeletal  (+) Arthritis , Osteoarthritis,    Abdominal   Peds  Hematology   Anesthesia Other Findings   Reproductive/Obstetrics                            Anesthesia Physical Anesthesia Plan  ASA: II  Anesthesia Plan: General   Post-op Pain Management:    Induction: Intravenous  PONV Risk Score and Plan: 1 and Ondansetron and Treatment may vary due to age or medical condition  Airway Management Planned: Oral ETT  Additional Equipment:   Intra-op Plan:   Post-operative Plan: Extubation in OR  Informed Consent:   Plan Discussed with: CRNA and Surgeon  Anesthesia Plan Comments:       Anesthesia Quick Evaluation

## 2018-07-15 NOTE — Anesthesia Postprocedure Evaluation (Signed)
Anesthesia Post Note  Patient: Shaun Camacho  Procedure(s) Performed: ANTERIOR CERVICAL DECOMPRESSION FUSION, CERVICAL FIVE-SIX, CERVICAL SIX-SEVEN WITH INSTRUMENTATION AND ALLOGRAFT (N/A Neck)     Patient location during evaluation: PACU Anesthesia Type: General Level of consciousness: awake and alert Pain management: pain level controlled Vital Signs Assessment: post-procedure vital signs reviewed and stable Respiratory status: spontaneous breathing, nonlabored ventilation, respiratory function stable and patient connected to nasal cannula oxygen Cardiovascular status: blood pressure returned to baseline and stable Postop Assessment: no apparent nausea or vomiting Anesthetic complications: no    Last Vitals:  Vitals:   07/15/18 1800 07/15/18 1805  BP:  (!) 151/97  Pulse: 86 89  Resp: 16 (!) 25  Temp:    SpO2: 96% 96%    Last Pain:  Vitals:   07/15/18 1735  TempSrc:   PainSc: 5                  [name] DAVID

## 2018-07-15 NOTE — Op Note (Signed)
NAME:  Shaun CardWilliam Camacho                MEDICAL RECORD NO.:  [id number]  PHYSICIAN:  Estill BambergMark Kiki Bivens, MD      DATE OF BIRTH:  01-Mar-1986  DATE OF PROCEDURE:  07/15/2018                              OPERATIVE REPORT   PREOPERATIVE DIAGNOSES: 1. Bilateral cervical radiculopathy. 2. Spinal stenosis spanning C5-C7.  POSTOPERATIVE DIAGNOSES: 1. Bilateral cervical radiculopathy. 2. Spinal stenosis spanning C5-C7.  PROCEDURE: 1. Anterior cervical decompression and fusion C5/6, C6/7. 2. Placement of anterior instrumentation, C5-C7. 3. Insertion of interbody device x 2 (Titan intervertebral spacers). 4. Intraoperative use of fluoroscopy. 5. Use of morselized allograft - ViviGen.  SURGEON:  Estill BambergMark Washington Whedbee, MD  ASSISTANT:  Jason CoopKayla McKenzie, PA-C.  ANESTHESIA:  General endotracheal anesthesia.  COMPLICATIONS:  None.  DISPOSITION:  Stable.  ESTIMATED BLOOD LOSS:  Minimal.  INDICATIONS FOR SURGERY:  Briefly, Shaun Camacho is a pleasant 33 year old male, who did present to me with severe pain in the neck and bilateral arms.  The patient's MRI did reveal the findings noted above.  Given the patient's ongoing rather debilitating pain and lack of improvement with appropriate treatment measures, we did discuss proceeding with the procedure noted above.  The patient was fully aware of the risks and limitations of surgery as outlined in my preoperative note.  OPERATIVE DETAILS:  On 07/15/2018, the patient was brought to surgery and general endotracheal anesthesia was administered.  The patient was placed supine on the hospital bed. The neck was gently extended.  All bony prominences were meticulously padded.  The neck was prepped and draped in the usual sterile fashion.  At this point, I did make a left-sided transverse incision.  The platysma was incised.  A Smith-Robinson approach was used and the anterior spine was identified. A self-retaining retractor was placed.  I then  subperiosteally exposed the vertebral bodies from C5-C7.  Caspar pins were then placed into the C6 and C7 vertebral bodies and distraction was applied.  A thorough and complete C6-7 intervertebral diskectomy was performed.  The posterior longitudinal ligament was identified and entered using a nerve hook.  I then used #1 followed by #2 Kerrison to perform a thorough and complete intervertebral diskectomy.  The spinal canal was thoroughly decompressed, as was the right and left neuroforamen.  The endplates were then prepared and the appropriate-sized intervertebral spacer was then packed with ViviGen and tamped into position in the usual fashion.  The lower Caspar pin was then removed and placed into the C5 vertebral body and once again, distraction was applied across the C5-6 intervertebral space.  I then again performed a thorough and complete diskectomy, thoroughly decompressing the spinal canal and bilateral neuroforamena.  After preparing the endplates, the appropriate-sized intervertebral spacer was packed with ViviGen and tamped into position.  The Caspar pins then were removed and bone wax was placed in their place.  The appropriate-sized anterior cervical plate was placed over the anterior spine.  14 mm variable angle screws were placed, 2 in each vertebral body from C5-C7 for a total of 6 vertebral body screws.  The screws were then locked to the plate using the Cam locking mechanism.  I was very pleased with the final fluoroscopic images.  The wound was then irrigated.  The wound was then explored for any undue bleeding. Oozing was noted in  the region of the left C5/6 foramen, just lateral to the implant. This was controlled using Floseal and a patty.  No active oozing or bleeding was noted at the termination of the procedure.  The wound was then closed in layers using 2-0 Vicryl, followed by 4-0 Monocryl.  Benzoin and Steri-Strips were applied, followed by sterile dressing.  All  instrument counts were correct at the termination of the procedure.  Of note, Jason Coop, PA-C, was my assistant throughout surgery, and did aid in retraction, suctioning, and closure from start to finish.   Estill Bamberg, MD

## 2018-07-15 NOTE — Transfer of Care (Signed)
Immediate Anesthesia Transfer of Care Note  Patient: Shaun Camacho  Procedure(s) Performed: ANTERIOR CERVICAL DECOMPRESSION FUSION, CERVICAL FIVE-SIX, CERVICAL SIX-SEVEN WITH INSTRUMENTATION AND ALLOGRAFT (N/A Neck)  Patient Location: PACU  Anesthesia Type:General  Level of Consciousness: awake, alert  and oriented  Airway & Oxygen Therapy: Patient Spontanous Breathing and Patient connected to nasal cannula oxygen  Post-op Assessment: Report given to RN and Post -op Vital signs reviewed and stable  Post vital signs: Reviewed and stable  Last Vitals:  Vitals Value Taken Time  BP 155/90 07/15/2018  5:34 PM  Temp 36.7 C 07/15/2018  5:35 PM  Pulse 94 07/15/2018  5:41 PM  Resp 14 07/15/2018  5:41 PM  SpO2 96 % 07/15/2018  5:41 PM  Vitals shown include unvalidated device data.  Last Pain:  Vitals:   07/15/18 1735  TempSrc:   PainSc: 5       Patients Stated Pain Goal: 3 (07/15/18 1257)  Complications: No apparent anesthesia complications

## 2018-07-15 NOTE — Anesthesia Procedure Notes (Signed)
Procedure Name: Intubation Date/Time: 07/15/2018 2:23 PM Performed by: Neldon Newport, CRNA Pre-anesthesia Checklist: Timeout performed, Patient being monitored, Suction available, Emergency Drugs available and Patient identified Patient Re-evaluated:Patient Re-evaluated prior to induction Oxygen Delivery Method: Circle system utilized Preoxygenation: Pre-oxygenation with 100% oxygen Induction Type: IV induction Ventilation: Mask ventilation without difficulty and Oral airway inserted - appropriate to patient size Laryngoscope Size: Mac and 4 Grade View: Grade I Tube type: Oral Tube size: 7.5 mm Number of attempts: 1 Placement Confirmation: ETT inserted through vocal cords under direct vision,  positive ETCO2 and breath sounds checked- equal and bilateral Secured at: 22 cm Tube secured with: Tape Dental Injury: Teeth and Oropharynx as per pre-operative assessment

## 2018-07-16 ENCOUNTER — Encounter (HOSPITAL_COMMUNITY): Payer: Self-pay | Admitting: Orthopedic Surgery

## 2018-07-16 DIAGNOSIS — F17210 Nicotine dependence, cigarettes, uncomplicated: Secondary | ICD-10-CM | POA: Diagnosis not present

## 2018-07-16 DIAGNOSIS — M5412 Radiculopathy, cervical region: Secondary | ICD-10-CM | POA: Diagnosis not present

## 2018-07-16 DIAGNOSIS — Z79890 Hormone replacement therapy (postmenopausal): Secondary | ICD-10-CM | POA: Diagnosis not present

## 2018-07-16 DIAGNOSIS — K219 Gastro-esophageal reflux disease without esophagitis: Secondary | ICD-10-CM | POA: Diagnosis not present

## 2018-07-16 DIAGNOSIS — E291 Testicular hypofunction: Secondary | ICD-10-CM | POA: Diagnosis not present

## 2018-07-16 DIAGNOSIS — Z79899 Other long term (current) drug therapy: Secondary | ICD-10-CM | POA: Diagnosis not present

## 2018-07-16 DIAGNOSIS — M4802 Spinal stenosis, cervical region: Secondary | ICD-10-CM | POA: Diagnosis not present

## 2018-07-16 NOTE — Progress Notes (Signed)
    Patient doing well  Patient reports resolution of preoperative bilateral arm pain Tolerating PO well Has been ambulating   Physical Exam: Vitals:   07/16/18 0000 07/16/18 0300  BP: 133/78 118/75  Pulse: 69   Resp: 18   Temp: 98.2 F (36.8 C) 97.6 F (36.4 C)  SpO2: 97% 97%    Neck soft/supple Dressing in place NVI  POD #1 s/p C5-7 ACDF, doing well  - encourage ambulation - Percocet for pain, Valium for muscle spasms - d/c home today with f/u in 2 weeks

## 2018-07-23 NOTE — Discharge Summary (Signed)
Patient ID: Shaun Camacho MRN: [id number] DOB/AGE: 35-Aug-1987 35 y.o.  Admit date: 07/15/2018 Discharge date: 07/16/2018  Admission Diagnoses:  Active Problems:   Radiculopathy   Discharge Diagnoses:  Same  Past Medical History:  Diagnosis Date  . Anxiety and depression 08/23/2016   08/23/2016 PHQ9 = 6, GAD7 = 7 09/12/2016 PHQ9 = 6, GAD7 = 3 10/11/2016 PHQ9 = 1, GAD7 = 1 12/27/2016 PHQ9 = 3, GAD7 = 4 01/24/2017 PHQ9 = 2, GAD7 = 5  . Arthritis   . GERD (gastroesophageal reflux disease) 12/29/2015  . Male hypogonadism 01/12/2016    Surgeries: Procedure(s): ANTERIOR CERVICAL DECOMPRESSION FUSION, CERVICAL FIVE-SIX, CERVICAL SIX-SEVEN WITH INSTRUMENTATION AND ALLOGRAFT on 07/15/2018   Consultants: None  Discharged Condition: Improved  Hospital Course: Shaun Camacho is an 33 y.o. male who was admitted 07/15/2018 for operative treatment of radiculopathy. Patient has severe unremitting pain that affects sleep, daily activities, and work/hobbies. After pre-op clearance the patient was taken to the operating room on 07/15/2018 and underwent  Procedure(s): ANTERIOR CERVICAL DECOMPRESSION FUSION, CERVICAL FIVE-SIX, CERVICAL SIX-SEVEN WITH INSTRUMENTATION AND ALLOGRAFT.    Patient was given perioperative antibiotics:  Anti-infectives (From admission, onward)   Start     Dose/Rate Route Frequency Ordered Stop   07/15/18 2230  ceFAZolin (ANCEF) IVPB 2g/100 mL premix     2 g 200 mL/hr over 30 Minutes Intravenous Every 8 hours 07/15/18 1837 07/16/18 0711   07/15/18 2200  doxycycline (VIBRA-TABS) tablet 100 mg  Status:  Discontinued     100 mg Oral 2 times daily 07/15/18 1837 07/16/18 1246   07/15/18 1145  ceFAZolin (ANCEF) IVPB 2g/100 mL premix     2 g 200 mL/hr over 30 Minutes Intravenous On call to O.R. 07/15/18 1140 07/15/18 1427       Patient was given sequential compression devices, early ambulation to prevent DVT.  Patient benefited maximally from hospital stay and there were no  complications.    Recent vital signs: BP 128/73 (BP Location: Right Arm)   Pulse 69   Temp 97.6 F (36.4 C) (Oral)   Resp 18   Ht 5\' 10"  (1.778 m)   Wt 97.1 kg   SpO2 97%   BMI 30.71 kg/m    Discharge Medications:   Allergies as of 07/16/2018      Reactions   Tramadol Palpitations   Meloxicam Other (See Comments)   Somnolence      Medication List    STOP taking these medications   cyclobenzaprine 10 MG tablet Commonly known as:  FLEXERIL   HYDROcodone-acetaminophen 5-325 MG tablet Commonly known as:  NORCO/VICODIN     TAKE these medications   B-D 3CC LUER-LOK SYR 22GX1-1/2 22G X 1-1/2" 3 ML Misc Generic drug:  SYRINGE-NEEDLE (DISP) 3 ML USE AS DIRECTED   BD HYPODERMIC NEEDLE 18G X 1" Misc Generic drug:  NEEDLE (DISP) 18 G USE AS DIRECTED   omeprazole 40 MG capsule Commonly known as:  PRILOSEC TAKE 1 CAPSULE BY MOUTH EVERY DAY   testosterone cypionate 200 MG/ML injection Commonly known as:  DEPOTESTOSTERONE CYPIONATE INJECT 1 ML IN THE MUSCLE EVERY 14 DAYS What changed:  See the new instructions.     ASK your doctor about these medications   doxycycline 100 MG tablet Commonly known as:  VIBRA-TABS Take 1 tablet (100 mg total) by mouth 2 (two) times daily for 7 days. Ask about: Should I take this medication?       Diagnostic Studies: Dg Cervical Spine 1 View  Result  Date: 07/15/2018 CLINICAL DATA:  C5 through 7 ACDF EXAM: DG C-ARM 61-120 MIN; DG CERVICAL SPINE - 1 VIEW COMPARISON:  None. FINDINGS: Single lateral view of the cervical spine, intraoperative fluoroscopic spot view. Total fluoroscopy time was 14 seconds. The images demonstrate surgical sponge material within the anterior neck soft tissues. Plate and screw fixation C5 through C7 with interbody devices at C5-C6 and C6-C7. IMPRESSION: Intraoperative fluoroscopic assistance provided during cervical spine surgery. Electronically Signed   By: Jasmine Pang M.D.   On: 07/15/2018 17:35   Mr Lumbar Spine  Wo Contrast  Result Date: 06/27/2018 CLINICAL DATA:  Acute right-sided low back pain without sciatica EXAM: MRI LUMBAR SPINE WITHOUT CONTRAST TECHNIQUE: Multiplanar, multisequence MR imaging of the lumbar spine was performed. No intravenous contrast was administered. COMPARISON:  Lumbar radiographs 05/25/2018 FINDINGS: Segmentation:  Normal.  Lowest fully developed disc space L5-S1 Alignment:  Normal Vertebrae:  Negative for fracture or mass. Conus medullaris and cauda equina: Conus extends to the L1-2 level. Conus and cauda equina appear normal. Paraspinal and other soft tissues: Negative for paraspinous mass or fluid collection. Hemangioma right posterior iliac bone. Disc levels: L1-2: Mild disc degeneration and Schmorl's node without stenosis L2-3: Mild disc degeneration with small Schmorl's node. Negative for disc protrusion or stenosis L3-4: Mild disc bulging and Schmorl's node. Mild facet degeneration. Negative for disc protrusion or stenosis L4-5: Mild disc bulging and mild facet degeneration. Negative for disc protrusion or stenosis. L5-S1: Mild disc and facet degeneration. Negative for disc protrusion or stenosis. IMPRESSION: Mild degenerative changes throughout the lumbar spine. No focal disc protrusion or neural impingement. Electronically Signed   By: Marlan Palau M.D.   On: 06/27/2018 09:45   Dg C-arm 1-60 Min  Result Date: 07/15/2018 CLINICAL DATA:  C5 through 7 ACDF EXAM: DG C-ARM 61-120 MIN; DG CERVICAL SPINE - 1 VIEW COMPARISON:  None. FINDINGS: Single lateral view of the cervical spine, intraoperative fluoroscopic spot view. Total fluoroscopy time was 14 seconds. The images demonstrate surgical sponge material within the anterior neck soft tissues. Plate and screw fixation C5 through C7 with interbody devices at C5-C6 and C6-C7. IMPRESSION: Intraoperative fluoroscopic assistance provided during cervical spine surgery. Electronically Signed   By: Jasmine Pang M.D.   On: 07/15/2018 17:35    Dg Inject Diag/thera/inc Needle/cath/plc Epi/lumb/sac W/img  Result Date: 07/13/2018 CLINICAL DATA:  Lumbosacral spondylosis without myelopathy. Right low back pain. FLUOROSCOPY TIME:  Radiation Exposure Index (as provided by the fluoroscopic device): 12.74 microGray*m^2 Fluoroscopy Time (in minutes and seconds):  7 seconds PROCEDURE: The procedure, risks, benefits, and alternatives were explained to the patient. Questions regarding the procedure were encouraged and answered. The patient understands and consents to the procedure. LUMBAR EPIDURAL INJECTION: An interlaminar approach was performed on the right at L4-5. The overlying skin was cleansed and anesthetized. A 3.5 inch 20 gauge epidural needle was advanced using loss-of-resistance technique. DIAGNOSTIC EPIDURAL INJECTION: Injection of Isovue-M 200 shows a good epidural pattern with spread above and below the level of needle placement, primarily on the right. No vascular opacification is seen. THERAPEUTIC EPIDURAL INJECTION: 120 mg of Depo-Medrol mixed with 3 mL of 1% lidocaine were instilled. The procedure was well-tolerated, and the patient was discharged thirty minutes following the injection in good condition. COMPLICATIONS: None IMPRESSION: Technically successful lumbar interlaminar epidural injection on the right at L4-5. Electronically Signed   By: Sebastian Ache M.D.   On: 07/13/2018 08:57    Disposition:    POD #1 s/p C5-7 ACDF, doing  well  - encourage ambulation - Percocet for pain, Valium for muscle spasms -Written scripts for pain signed and in chart -D/C instructions sheet printed and in chart -D/C today  -F/U in office 2 weeks   Signed: Georga BoraKayla J Merlie Noga 07/23/2018, 7:48 AM

## 2018-07-29 DIAGNOSIS — M4802 Spinal stenosis, cervical region: Secondary | ICD-10-CM | POA: Diagnosis not present

## 2018-08-06 ENCOUNTER — Encounter: Payer: Self-pay | Admitting: Sports Medicine

## 2018-08-07 ENCOUNTER — Telehealth: Payer: Self-pay | Admitting: Sports Medicine

## 2018-08-07 DIAGNOSIS — E291 Testicular hypofunction: Secondary | ICD-10-CM

## 2018-08-07 MED ORDER — TESTOSTERONE CYPIONATE 200 MG/ML IM SOLN: INTRAMUSCULAR | 0 refills | Status: DC

## 2018-08-07 NOTE — Telephone Encounter (Signed)
We have not checked testosterone levels in 2 years. Refilling testosterone, patient needs to return for testosterone levels.

## 2018-08-07 NOTE — Telephone Encounter (Signed)
Pt advised, he will complete next week as he did his injection today.

## 2018-08-07 NOTE — Assessment & Plan Note (Signed)
We have not checked testosterone levels in 2 years. Refilling testosterone, patient needs to return for testosterone levels. 

## 2018-08-13 ENCOUNTER — Ambulatory Visit (INDEPENDENT_AMBULATORY_CARE_PROVIDER_SITE_OTHER): Payer: BLUE CROSS/BLUE SHIELD | Admitting: Sports Medicine

## 2018-08-13 ENCOUNTER — Encounter: Payer: Self-pay | Admitting: Sports Medicine

## 2018-08-13 DIAGNOSIS — M7711 Lateral epicondylitis, right elbow: Secondary | ICD-10-CM

## 2018-08-13 DIAGNOSIS — Z981 Arthrodesis status: Secondary | ICD-10-CM | POA: Diagnosis not present

## 2018-08-13 DIAGNOSIS — G5603 Carpal tunnel syndrome, bilateral upper limbs: Secondary | ICD-10-CM | POA: Diagnosis not present

## 2018-08-13 DIAGNOSIS — M79642 Pain in left hand: Secondary | ICD-10-CM

## 2018-08-13 DIAGNOSIS — M2550 Pain in unspecified joint: Secondary | ICD-10-CM | POA: Diagnosis not present

## 2018-08-13 DIAGNOSIS — G5600 Carpal tunnel syndrome, unspecified upper limb: Secondary | ICD-10-CM | POA: Insufficient documentation

## 2018-08-13 DIAGNOSIS — M79641 Pain in right hand: Secondary | ICD-10-CM | POA: Diagnosis not present

## 2018-08-13 DIAGNOSIS — M545 Low back pain, unspecified: Secondary | ICD-10-CM

## 2018-08-13 MED ORDER — PREGABALIN 50 MG PO CAPS: 50.0000 mg | ORAL_CAPSULE | Freq: Every day | ORAL | 3 refills | Status: DC

## 2018-08-13 MED ORDER — NITROGLYCERIN 0.2 MG/HR TD PT24: MEDICATED_PATCH | TRANSDERMAL | 11 refills | Status: DC

## 2018-08-13 NOTE — Assessment & Plan Note (Signed)
Polyarthralgias with morning stiffness. He was told he has some type of autoimmune arthritis in the distant past. No nail pitting. No family history of rheumatoid arthritis. He does have some left-sided temporal artery throbbing and pain. Doing a full rheumatoid work-up including an ESR looking for lupus, rheumatoid arthritis, serologic evidence of giant cell arteritis.

## 2018-08-13 NOTE — Progress Notes (Signed)
Subjective:    CC: Follow-up  HPI: Shaun Camacho is a pleasant 33 year old male, he is post cervical ACDF and doing well.  He is complaining about pain in both hands, worse at night and in the morning with occasional paresthesias from the wrist to the palms.  It also occurs with prolonged gripping motions.  He did have a nerve conduction study sometime ago that was negative.  He does endorse dropping things occasionally.  In addition he has continued pain in his right elbow, we have been treating her for tennis elbow with bracing, activity modification, and occasional steroid injections.  He has not had an elbow MRI.  Low back pain: Mild DDD at L4-L5 without compressive phenomena, he did have a lumbar epidural over a month ago without any improvement.  He is asking about the possibility of disability with regards to his cervical and lumbar spine.  He does have a family history of autoimmune disease and was told that he had some type of autoimmune arthritis in the past.  He does endorse polyarthralgias and morning stiffness.  Occasional left-sided temporal arterial throbbing and pain.  I reviewed the past medical history, family history, social history, surgical history, and allergies today and no changes were needed.  Please see the problem list section below in epic for further details.  Past Medical History: Past Medical History:  Diagnosis Date  . Anxiety and depression 08/23/2016   08/23/2016 PHQ9 = 6, GAD7 = 7 09/12/2016 PHQ9 = 6, GAD7 = 3 10/11/2016 PHQ9 = 1, GAD7 = 1 12/27/2016 PHQ9 = 3, GAD7 = 4 01/24/2017 PHQ9 = 2, GAD7 = 5  . Arthritis   . GERD (gastroesophageal reflux disease) 12/29/2015  . Male hypogonadism 01/12/2016  . Radiculitis of right cervical region 03/22/2016   MRI from [facility] shows moderate to severe right C6-C7 neuroforaminal stenosis with mass-effect on the exiting right C7 nerve root.   Past Surgical History: Past Surgical History:  Procedure Laterality Date  .  ANTERIOR CERVICAL DECOMP/DISCECTOMY FUSION N/A 07/15/2018   Procedure: ANTERIOR CERVICAL DECOMPRESSION FUSION, CERVICAL FIVE-SIX, CERVICAL SIX-SEVEN WITH INSTRUMENTATION AND ALLOGRAFT;  Surgeon: Phylliss Bob, MD;  Location: [place] [facility];  Service: Orthopedics;  Laterality: N/A;  . SURGERY SCROTAL / TESTICULAR     Social History: Social History   Socioeconomic History  . Marital status: Married    Spouse name: Not on file  . Number of children: Not on file  . Years of education: Not on file  . Highest education level: Not on file  Occupational History  . Not on file  Social Needs  . Financial resource strain: Not on file  . Food insecurity:    Worry: Not on file    Inability: Not on file  . Transportation needs:    Medical: Not on file    Non-medical: Not on file  Tobacco Use  . Smoking status: Current Every Day Smoker    Packs/day: 0.50    Years: 18.00    Pack years: 9.00  . Smokeless tobacco: Current User    Types: Chew  Substance and Sexual Activity  . Alcohol use: Not on file    Comment: rare  . Drug use: No  . Sexual activity: Not on file  Lifestyle  . Physical activity:    Days per week: Not on file    Minutes per session: Not on file  . Stress: Not on file  Relationships  . Social connections:    Talks on phone: Not on file  Gets together: Not on file    Attends religious service: Not on file    Active member of club or organization: Not on file    Attends meetings of clubs or organizations: Not on file    Relationship status: Not on file  Other Topics Concern  . Not on file  Social History Narrative  . Not on file   Family History: Family History  Problem Relation Age of Onset  . Thyroid disease Father   . Thyroid disease Sister   . Diabetes Maternal Grandmother    Allergies: Allergies  Allergen Reactions  . Tramadol Palpitations  . Meloxicam Other (See Comments)    Somnolence   Medications: See med rec.  Review of Systems: No fevers, chills,  night sweats, weight loss, chest pain, or shortness of breath.   Objective:    General: Well Developed, well nourished, and in no acute distress.  Neuro: Alert and oriented x3, extra-ocular muscles intact, sensation grossly intact.  HEENT: Normocephalic, atraumatic, pupils equal round reactive to light, neck supple, no masses, no lymphadenopathy, thyroid nonpalpable.  Skin: Warm and dry, no rashes. Cardiac: Regular rate and rhythm, no murmurs rubs or gallops, no lower extremity edema.  Respiratory: Clear to auscultation bilaterally. Not using accessory muscles, speaking in full sentences. Bilateral wrists: Inspection normal with no visible erythema or swelling. ROM smooth and normal with good flexion and extension and ulnar/radial deviation that is symmetrical with opposite wrist. Palpation is normal over metacarpals, navicular, lunate, and TFCC; tendons without tenderness/ swelling No snuffbox tenderness. No tenderness over Canal of Guyon. Strength 5/5 in all directions without pain. Negative tinel's and phalens signs. Negative Finkelstein sign. Negative Watson's test. No visible or palpable synovitis though his interphalangeal joints are somewhat large. No nail pitting.  Impression and Recommendations:    Polyarthralgia Polyarthralgias with morning stiffness. He was told he has some type of autoimmune arthritis in the distant past. No nail pitting. No family history of rheumatoid arthritis. He does have some left-sided temporal artery throbbing and pain. Doing a full rheumatoid work-up including an ESR looking for lupus, rheumatoid arthritis, serologic evidence of giant cell arteritis.   Bilateral hand pain Possible carpal tunnel syndrome, he did have normal nerve conduction/EMG but the sensitivity of this test is not excellent. Bilateral nighttime wrist splints. Lyrica low dose. He did not tolerate gabapentin. Return in a month, hydrodissection if no better.  Lateral  epicondylitis, right elbow Has failed several injections. Adding topical nitroglycerin. If this fails over 4 to 8 weeks we will try PRP.  History of fusion of cervical spine Now doing extremely well after cervical ACDF.  Acute right-sided low back pain Mild DDD. No better after epidural.  I spent 40 minutes with this patient, greater than 50% was face-to-face time counseling regarding the above diagnoses, specifically discussing the diagnosis and treatment options of his multiple medical complaints. ___________________________________________ Gwen Her. Dianah Field, M.D., ABFM., CAQSM. Primary Care and Sports Medicine [facility]  Adjunct Professor of [employer]

## 2018-08-13 NOTE — Assessment & Plan Note (Signed)
Possible carpal tunnel syndrome, he did have normal nerve conduction/EMG but the sensitivity of this test is not excellent. Bilateral nighttime wrist splints. Lyrica low dose. He did not tolerate gabapentin. Return in a month, hydrodissection if no better.

## 2018-08-13 NOTE — Assessment & Plan Note (Signed)
Now doing extremely well after cervical ACDF.

## 2018-08-13 NOTE — Assessment & Plan Note (Signed)
Has failed several injections. Adding topical nitroglycerin. If this fails over 4 to 8 weeks we will try PRP.

## 2018-08-13 NOTE — Assessment & Plan Note (Signed)
Mild DDD. No better after epidural.

## 2018-08-17 ENCOUNTER — Encounter: Payer: Self-pay | Admitting: Sports Medicine

## 2018-08-18 ENCOUNTER — Encounter: Payer: Self-pay | Admitting: Sports Medicine

## 2018-08-18 ENCOUNTER — Other Ambulatory Visit: Payer: Self-pay

## 2018-08-18 DIAGNOSIS — Z125 Encounter for screening for malignant neoplasm of prostate: Secondary | ICD-10-CM | POA: Diagnosis not present

## 2018-08-18 DIAGNOSIS — E291 Testicular hypofunction: Secondary | ICD-10-CM | POA: Diagnosis not present

## 2018-08-18 DIAGNOSIS — L988 Other specified disorders of the skin and subcutaneous tissue: Secondary | ICD-10-CM

## 2018-08-18 LAB — ANA, IFA COMPREHENSIVE PANEL
Anti Nuclear Antibody(ANA): NEGATIVE
ENA SM Ab Ser-aCnc: <1 AI
SM/RNP: <1 AI
SSA (Ro) (ENA) Antibody, IgG: <1 AI
SSB (La) (ENA) Antibody, IgG: <1 AI
Scleroderma (Scl-70) (ENA) Antibody, IgG: <1 AI
ds DNA Ab: 2 IU/mL

## 2018-08-18 LAB — CBC WITH DIFFERENTIAL/PLATELET
Absolute Monocytes: 508 cells/uL (ref 200–950)
Basophils Absolute: 33 cells/uL (ref 0–200)
Basophils Relative: 0.5 %
Eosinophils Absolute: 251 cells/uL (ref 15–500)
Eosinophils Relative: 3.8 %
HCT: 47.8 % (ref 38.5–50.0)
Hemoglobin: 16.4 g/dL (ref 13.2–17.1)
Lymphs Abs: 1703 cells/uL (ref 850–3900)
MCH: 31.2 pg (ref 27.0–33.0)
MCHC: 34.3 g/dL (ref 32.0–36.0)
MCV: 91 fL (ref 80.0–100.0)
MPV: 9.8 fL (ref 7.5–12.5)
Monocytes Relative: 7.7 %
Neutro Abs: 4105 cells/uL (ref 1500–7800)
Neutrophils Relative %: 62.2 %
Platelets: 228 10*3/uL (ref 140–400)
RBC: 5.25 10*6/uL (ref 4.20–5.80)
RDW: 12.3 % (ref 11.0–15.0)
Total Lymphocyte: 25.8 %
WBC: 6.6 10*3/uL (ref 3.8–10.8)

## 2018-08-18 LAB — COMPREHENSIVE METABOLIC PANEL
AG Ratio: 2.2 (calc) (ref 1.0–2.5)
ALT: 18 U/L (ref 9–46)
AST: 16 U/L (ref 10–40)
Albumin: 5 g/dL (ref 3.6–5.1)
Alkaline phosphatase (APISO): 88 U/L (ref 36–130)
BUN: 13 mg/dL (ref 7–25)
CO2: 30 mmol/L (ref 20–32)
Calcium: 9.6 mg/dL (ref 8.6–10.3)
Chloride: 100 mmol/L (ref 98–110)
Creat: 1.18 mg/dL (ref 0.60–1.35)
Globulin: 2.3 g/dL (calc) (ref 1.9–3.7)
Glucose, Bld: 95 mg/dL (ref 65–139)
Potassium: 3.9 mmol/L (ref 3.5–5.3)
Sodium: 139 mmol/L (ref 135–146)
Total Bilirubin: 0.5 mg/dL (ref 0.2–1.2)
Total Protein: 7.3 g/dL (ref 6.1–8.1)

## 2018-08-18 LAB — RHEUMATOID ARTHRITIS DIAGNOSTIC PANEL, COMPREHENSIVE
Cyclic Citrullin Peptide Ab: <16 Units (ref <20)
Rheumatoid Factor (IgA): <5 U (ref <=6)
Rheumatoid Factor (IgG): 5 U (ref <=6)
Rheumatoid Factor (IgM): <5 U (ref <=6)
SSA (Ro) (ENA) Antibody, IgG: <1 AI
SSB (La) (ENA) Antibody, IgG: <1 AI

## 2018-08-18 LAB — HLA-B27 ANTIGEN: HLA-B27 Antigen: NEGATIVE

## 2018-08-18 LAB — URIC ACID: Uric Acid, Serum: 5.8 mg/dL (ref 4.0–8.0)

## 2018-08-18 LAB — SEDIMENTATION RATE: Sed Rate: 2 mm/h (ref 0–15)

## 2018-08-18 LAB — CK: Total CK: 139 U/L (ref 44–196)

## 2018-08-21 LAB — CBC
HCT: 47.7 % (ref 38.5–50.0)
Hemoglobin: 16.9 g/dL (ref 13.2–17.1)
MCH: 31.9 pg (ref 27.0–33.0)
MCHC: 35.4 g/dL (ref 32.0–36.0)
MCV: 90 fL (ref 80.0–100.0)
MPV: 10.1 fL (ref 7.5–12.5)
Platelets: 262 10*3/uL (ref 140–400)
RBC: 5.3 10*6/uL (ref 4.20–5.80)
RDW: 12.7 % (ref 11.0–15.0)
WBC: 7.3 10*3/uL (ref 3.8–10.8)

## 2018-08-21 LAB — PSA, TOTAL AND FREE
PSA, % Free: 50 % (calc) (ref >25)
PSA, Free: 0.4 ng/mL
PSA, Total: 0.8 ng/mL (ref <=4.0)

## 2018-08-21 LAB — TESTOSTERONE, FREE & TOTAL
Free Testosterone: 124.4 pg/mL (ref 35.0–155.0)
Testosterone, Total, LC-MS-MS: 553 ng/dL (ref 250–1100)

## 2018-08-24 DIAGNOSIS — Z9889 Other specified postprocedural states: Secondary | ICD-10-CM | POA: Diagnosis not present

## 2018-08-24 DIAGNOSIS — M4802 Spinal stenosis, cervical region: Secondary | ICD-10-CM | POA: Diagnosis not present

## 2018-08-26 ENCOUNTER — Encounter: Payer: Self-pay | Admitting: Sports Medicine

## 2018-08-27 DIAGNOSIS — L988 Other specified disorders of the skin and subcutaneous tissue: Secondary | ICD-10-CM | POA: Diagnosis not present

## 2018-08-31 DIAGNOSIS — M47816 Spondylosis without myelopathy or radiculopathy, lumbar region: Secondary | ICD-10-CM | POA: Diagnosis not present

## 2018-09-01 ENCOUNTER — Other Ambulatory Visit: Payer: Self-pay

## 2018-09-01 ENCOUNTER — Ambulatory Visit: Payer: Self-pay | Admitting: Surgery

## 2018-09-01 ENCOUNTER — Encounter (HOSPITAL_COMMUNITY): Payer: Self-pay | Admitting: *Deleted

## 2018-09-01 DIAGNOSIS — L0591 Pilonidal cyst without abscess: Secondary | ICD-10-CM | POA: Diagnosis not present

## 2018-09-01 NOTE — H&P (Signed)
Shaun Camacho Documented: 09/01/2018 11:35 AM Location: [facility] Patient #: [id number] DOB: 04/25/1986 Married / Language: English / Race: White Male  History of Present Illness (Adilen Pavelko C. Cullin Dishman MD; 09/01/2018 12:19 PM) The patient is a 33 year old male who presents with a pilonidal cyst. Note for "Pilonidal cyst": ` ` ` Patient sent for surgical consultation at the request of Puja Gosai  Chief Complaint: Chronic pilonidal disease with recurrent abscess/drainage. ` ` The patient is a young male with chronic pilonidal disease for many years. He's had have incision and drainage done in the past. Recurrent drainage. Usually improves with antibiotics. However had persistent disease. Primary care physician sent him for referral to our group. My partner, Dr. Thomas, saw him last May. Recommended surgical excision. Concern about some extension over to the right buttock. The patient delayed having surgery given his work requirements and hard to find 3 weeks to recover after surgery. However he had repeated flare with pain and discomfort and called last week. Had spontaneously draining abscess. Placed on antibiotics. Surgical reevaluation recommended. Dr. Thomas running the inpatient service this week. Patient did not wish to wait. I had availability this week, so he was sent to my clinic.   Patient had worsening cervical neck disease and required anterior cervical decompression with fusion C5/6 and C6/7 on 07/15/2018. He's been on short-term disability. He was hoping to consider pilonidal surgery while he still recovering from that in order to minimize time off work. Recent flare made the pilonidal disease become a priority again. He does smoke. Less than a pack a day. Moves his bowels twice a day. No diabetes. No cardiopulmonary issues. He works in law enforcement.  (Review of systems as stated in this history (HPI) or in the review of systems. Otherwise all  other 12 point ROS are negative) ` ` `   Medication History (Armen Ferguson, CMA; 09/01/2018 11:37 AM) Lyrica (50MG Capsule, Oral) Active. Testosterone Cypionate (200MG/ML Solution, Intramuscular) Active. Medications Reconciled    Vitals (Armen Ferguson CMA; 09/01/2018 11:36 AM) 09/01/2018 11:36 AM Weight: 209.13 lb Height: 70in Body Surface Area: 2.13 m Body Mass Index: 30.01 kg/m  Temp.: 99.2F  Pulse: 93 (Regular)  P.OX: 98% (Room air) BP: 136/84 (Sitting, Left Arm, Standard)      Physical Exam (Jorryn Hershberger C. Chabeli Barsamian MD; 09/01/2018 12:19 PM)  General Mental Status-Alert. General Appearance-Not in acute distress, Not Sickly. Orientation-Oriented X3. Hydration-Well hydrated. Voice-Normal.  Integumentary Global Assessment Upon inspection and palpation of skin surfaces of the - Axillae: non-tender, no inflammation or ulceration, no drainage. and Distribution of scalp and body hair is normal. General Characteristics Temperature - normal warmth is noted.  Head and Neck Head-normocephalic, atraumatic with no lesions or palpable masses. Face Global Assessment - atraumatic, no absence of expression. Neck Global Assessment - no abnormal movements, no bruit auscultated on the right, no bruit auscultated on the left, no decreased range of motion, non-tender. Trachea-midline. Thyroid Gland Characteristics - non-tender. Note: Left anterior oblique incision consistent with recent neck surgery. Normal healing ridge.  Eye Eyeball - Left-Extraocular movements intact, No Nystagmus. Eyeball - Right-Extraocular movements intact, No Nystagmus. Cornea - Left-No Hazy. Cornea - Right-No Hazy. Sclera/Conjunctiva - Left-No scleral icterus, No Discharge. Sclera/Conjunctiva - Right-No scleral icterus, No Discharge. Pupil - Left-Direct reaction to light normal. Pupil - Right-Direct reaction to light normal.  ENMT Ears Pinna - Left - no drainage  observed, no generalized tenderness observed. Right - no drainage observed, no generalized tenderness observed. Nose and Sinuses   External Inspection of the Nose - no destructive lesion observed. Inspection of the nares - Left - quiet respiration. Right - quiet respiration. Mouth and Throat Lips - Upper Lip - no fissures observed, no pallor noted. Lower Lip - no fissures observed, no pallor noted. Nasopharynx - no discharge present. Oral Cavity/Oropharynx - Tongue - no dryness observed. Oral Mucosa - no cyanosis observed. Hypopharynx - no evidence of airway distress observed.  Chest and Lung Exam Inspection Movements - Normal and Symmetrical. Accessory muscles - No use of accessory muscles in breathing. Palpation Palpation of the chest reveals - Non-tender. Auscultation Breath sounds - Normal and Clear.  Cardiovascular Auscultation Rhythm - Regular. Murmurs & Other Heart Sounds - Auscultation of the heart reveals - No Murmurs and No Systolic Clicks.  Abdomen Inspection Inspection of the abdomen reveals - No Visible peristalsis and No Abnormal pulsations. Umbilicus - No Bleeding, No Urine drainage. Palpation/Percussion Palpation and Percussion of the abdomen reveal - Soft, Non Tender, No Rebound tenderness, No Rigidity (guarding) and No Cutaneous hyperesthesia. Note: Abdomen soft. Nontender. Not distended. A 5 mm umbilical hernia stalk only. No pain present with cough only. No diastases.  Male Genitourinary Sexual Maturity Tanner 5 - Adult hair pattern and Adult penile size and shape. Note: No inguinal hernias. Normal external genitalia. Epididymi, testes, and spermatic cords normal without any masses.  Rectal Note: Chronic diverting. An upper aspect of intergluteal cleft with 3 x 2 center area of firmness. Scar to the right side as well. Consistent with chronic pilonidal disease. No active cellulitis or abscess at this time. Significant hair burden.  Perianal skin clean with  good hygiene. No pruritis ani. No pilonidal disease. No fissure. No abscess/fistula. Normal sphincter tone. No external hemorrhoids. No condyloma warts. Held off on any internal examination  Peripheral Vascular Upper Extremity Inspection - Left - No Cyanotic nailbeds, Not Ischemic. Right - No Cyanotic nailbeds, Not Ischemic.  Neurologic Neurologic evaluation reveals -normal attention span and ability to concentrate, able to name objects and repeat phrases. Appropriate fund of knowledge , normal sensation and normal coordination. Mental Status Affect - not angry, not paranoid. Cranial Nerves-Normal Bilaterally. Gait-Normal.  Neuropsychiatric Mental status exam performed with findings of-able to articulate well with normal speech/language, rate, volume and coherence, thought content normal with ability to perform basic computations and apply abstract reasoning and no evidence of hallucinations, delusions, obsessions or homicidal/suicidal ideation.  Musculoskeletal Global Assessment Spine, Ribs and Pelvis - no instability, subluxation or laxity. Right Upper Extremity - no instability, subluxation or laxity.  Lymphatic Head & Neck  General Head & Neck Lymphatics: Bilateral - Description - No Localized lymphadenopathy. Axillary  General Axillary Region: Bilateral - Description - No Localized lymphadenopathy. Femoral & Inguinal  Generalized Femoral & Inguinal Lymphatics: Left - Description - No Localized lymphadenopathy. Right - Description - No Localized lymphadenopathy.    Assessment & Plan Ardeth Sportsman MD; 09/01/2018 12:16 PM)  PILONIDAL CYST WITHOUT INFECTION (L05.91) Impression: Chronic pilonidal disease and upper aspect intergluteal cleft with some right intergluteal scarring.  I think he would benefit from surgery remove all his chronic scar tissue disease. Hopefully can primarily close this with some temporary umbilical tape Windex. Usually have superficial and  removed at a week, deeper wanted to weeks. Then follow up with me.  I strongly recommend he quit smoking. I noted smokers have much higher risk for recurrent infections and recurrent disease.  He is 5 weeks out from anterior cervical neck surgery with disc replacement by Dr.  Dumonski. He is still on disability given his significant work requirements on forced. He was ideally hoping to get it done before that. I may have time this week since the short case. We will see.  I did caution him that he will need several weeks to recover. Think that was a reasonable to delay from last year. However with the recurrent infections, he is not ready to reconsider surgery. His wife agrees.  Current Plans You are being scheduled for surgery- Our schedulers will call you.  You should hear from our office's scheduling department within 5 working days about the location, date, and time of surgery. We try to make accommodations for patient's preferences in scheduling surgery, but sometimes the OR schedule or the surgeon's schedule prevents [place] from making those accommodations.  If you have not heard from our office [phone]) in 5 working days, call the office and ask for your surgeon's nurse.  If you have other questions about your diagnosis, plan, or surgery, call the office and ask for your surgeon's nurse.  Pt Education - CCS Pilonidal Disease (AT) The anatomy of the intragluteal cleft was discussed. Pathophysiology of pilonidal disease was discussed. The importance of keeping hairs trimmed to avoid recurrence was discussed. Discussion of options such as curretage, excision with closure vs leaving open was discussed. Risks of infection with need for incision and drainage & antibiotics were discussed. I noted a good likelihood this will help address the problem.  At this point, I think the patient would best served with considering surgery to excise the diseased tissue. I will make an attempt to close  but it may need to be left open to allow it to heal with secondary intention and wound packing. Possible recurrences need reoperation or different techniques were discussed as well. I noted that recurrence is higher with poor compliance on hair removal hygiene & overall health. The patient's questions were answered. The patient agrees to proceed.  Ardeth Sportsman, MD, FACS, MASCRS Gastrointestinal and Minimally Invasive Surgery    [address] [place], [place] [address] [phone] Main / Paging [phone] Fax

## 2018-09-01 NOTE — H&P (View-Only) (Signed)
Shaun Camacho Documented: 09/01/2018 11:35 AM Location: [facility] Patient #: [id number] DOB: 03-07-1986 Married / Language: English / Race: White Male  History of Present Illness Ardeth Sportsman MD; 09/01/2018 12:19 PM) The patient is a 33 year old male who presents with a pilonidal cyst. Note for "Pilonidal cyst": ` ` ` Patient sent for surgical consultation at the request of Hedda Slade  Chief Complaint: Chronic pilonidal disease with recurrent abscess/drainage. ` ` The patient is a young male with chronic pilonidal disease for many years. He's had have incision and drainage done in the past. Recurrent drainage. Usually improves with antibiotics. However had persistent disease. Primary care physician sent him for referral to our group. My partner, Dr. Maisie Fus, saw him last May. Recommended surgical excision. Concern about some extension over to the right buttock. The patient delayed having surgery given his work requirements and hard to find 3 weeks to recover after surgery. However he had repeated flare with pain and discomfort and called last week. Had spontaneously draining abscess. Placed on antibiotics. Surgical reevaluation recommended. Dr. Maisie Fus running the inpatient service this week. Patient did not wish to wait. I had availability this week, so he was sent to my clinic.   Patient had worsening cervical neck disease and required anterior cervical decompression with fusion C5/6 and C6/7 on 07/15/2018. He's been on short-term disability. He was hoping to consider pilonidal surgery while he still recovering from that in order to minimize time off work. Recent flare made the pilonidal disease become a priority again. He does smoke. Less than a pack a day. Moves his bowels twice a day. No diabetes. No cardiopulmonary issues. He works in [employer].  (Review of systems as stated in this history (HPI) or in the review of systems. Otherwise all  other 12 point ROS are negative) ` ` `   Medication History (Armen Ferguson, CMA; 09/01/2018 11:37 AM) Lyrica (  Capsule, Oral) Active. Testosterone Cypionate ( /ML Solution, Intramuscular) Active. Medications Reconciled    Vitals (Armen Ferguson CMA; 09/01/2018 11:36 AM) 09/01/2018 11:36 AM Weight: 209.13 lb Height: 70in Body Surface Area: 2.13 m Body Mass Index: 30.01 kg/m  Temp.: 99.43F  Pulse: 93 (Regular)  P.OX: 98% (Room air) BP: 136/84 (Sitting, Left Arm, Standard)      Physical Exam Ardeth Sportsman MD; 09/01/2018 12:19 PM)  General Mental Status-Alert. General Appearance-Not in acute distress, Not Sickly. Orientation-Oriented X3. Hydration-Well hydrated. Voice-Normal.  Integumentary Global Assessment Upon inspection and palpation of skin surfaces of the - Axillae: non-tender, no inflammation or ulceration, no drainage. and Distribution of scalp and body hair is normal. General Characteristics Temperature - normal warmth is noted.  Head and Neck Head-normocephalic, atraumatic with no lesions or palpable masses. Face Global Assessment - atraumatic, no absence of expression. Neck Global Assessment - no abnormal movements, no bruit auscultated on the right, no bruit auscultated on the left, no decreased range of motion, non-tender. Trachea-midline. Thyroid Gland Characteristics - non-tender. Note: Left anterior oblique incision consistent with recent neck surgery. Normal healing ridge.  Eye Eyeball - Left-Extraocular movements intact, No Nystagmus. Eyeball - Right-Extraocular movements intact, No Nystagmus. Cornea - Left-No Hazy. Cornea - Right-No Hazy. Sclera/Conjunctiva - Left-No scleral icterus, No Discharge. Sclera/Conjunctiva - Right-No scleral icterus, No Discharge. Pupil - Left-Direct reaction to light normal. Pupil - Right-Direct reaction to light normal.  ENMT Ears Pinna - Left - no drainage  observed, no generalized tenderness observed. Right - no drainage observed, no generalized tenderness observed. Nose and Sinuses  External Inspection of the Nose - no destructive lesion observed. Inspection of the nares - Left - quiet respiration. Right - quiet respiration. Mouth and Throat Lips - Upper Lip - no fissures observed, no pallor noted. Lower Lip - no fissures observed, no pallor noted. Nasopharynx - no discharge present. Oral Cavity/Oropharynx - Tongue - no dryness observed. Oral Mucosa - no cyanosis observed. Hypopharynx - no evidence of airway distress observed.  Chest and Lung Exam Inspection Movements - Normal and Symmetrical. Accessory muscles - No use of accessory muscles in breathing. Palpation Palpation of the chest reveals - Non-tender. Auscultation Breath sounds - Normal and Clear.  Cardiovascular Auscultation Rhythm - Regular. Murmurs & Other Heart Sounds - Auscultation of the heart reveals - No Murmurs and No Systolic Clicks.  Abdomen Inspection Inspection of the abdomen reveals - No Visible peristalsis and No Abnormal pulsations. Umbilicus - No Bleeding, No Urine drainage. Palpation/Percussion Palpation and Percussion of the abdomen reveal - Soft, Non Tender, No Rebound tenderness, No Rigidity (guarding) and No Cutaneous hyperesthesia. Note: Abdomen soft. Nontender. Not distended. A 5 mm umbilical hernia stalk only. No pain present with cough only. No diastases.  Male Genitourinary Sexual Maturity Tanner 5 - Adult hair pattern and Adult penile size and shape. Note: No inguinal hernias. Normal external genitalia. Epididymi, testes, and spermatic cords normal without any masses.  Rectal Note: Chronic diverting. An upper aspect of intergluteal cleft with 3 x 2 center area of firmness. Scar to the right side as well. Consistent with chronic pilonidal disease. No active cellulitis or abscess at this time. Significant hair burden.  Perianal skin clean with  good hygiene. No pruritis ani. No pilonidal disease. No fissure. No abscess/fistula. Normal sphincter tone. No external hemorrhoids. No condyloma warts. Held off on any internal examination  Peripheral Vascular Upper Extremity Inspection - Left - No Cyanotic nailbeds, Not Ischemic. Right - No Cyanotic nailbeds, Not Ischemic.  Neurologic Neurologic evaluation reveals -normal attention span and ability to concentrate, able to name objects and repeat phrases. Appropriate fund of knowledge , normal sensation and normal coordination. Mental Status Affect - not angry, not paranoid. Cranial Nerves-Normal Bilaterally. Gait-Normal.  Neuropsychiatric Mental status exam performed with findings of-able to articulate well with normal speech/language, rate, volume and coherence, thought content normal with ability to perform basic computations and apply abstract reasoning and no evidence of hallucinations, delusions, obsessions or homicidal/suicidal ideation.  Musculoskeletal Global Assessment Spine, Ribs and Pelvis - no instability, subluxation or laxity. Right Upper Extremity - no instability, subluxation or laxity.  Lymphatic Head & Neck  General Head & Neck Lymphatics: Bilateral - Description - No Localized lymphadenopathy. Axillary  General Axillary Region: Bilateral - Description - No Localized lymphadenopathy. Femoral & Inguinal  Generalized Femoral & Inguinal Lymphatics: Left - Description - No Localized lymphadenopathy. Right - Description - No Localized lymphadenopathy.    Assessment & Plan Ardeth Sportsman MD; 09/01/2018 12:16 PM)  PILONIDAL CYST WITHOUT INFECTION (L05.91) Impression: Chronic pilonidal disease and upper aspect intergluteal cleft with some right intergluteal scarring.  I think he would benefit from surgery remove all his chronic scar tissue disease. Hopefully can primarily close this with some temporary umbilical tape Windex. Usually have superficial and  removed at a week, deeper wanted to weeks. Then follow up with me.  I strongly recommend he quit smoking. I noted smokers have much higher risk for recurrent infections and recurrent disease.  He is 5 weeks out from anterior cervical neck surgery with disc replacement by Dr.  Dumonski. He is still on disability given his significant work requirements on forced. He was ideally hoping to get it done before that. I may have time this week since the short case. We will see.  I did caution him that he will need several weeks to recover. Think that was a reasonable to delay from last year. However with the recurrent infections, he is not ready to reconsider surgery. His wife agrees.  Current Plans You are being scheduled for surgery- Our schedulers will call you.  You should hear from our office's scheduling department within 5 working days about the location, date, and time of surgery. We try to make accommodations for patient's preferences in scheduling surgery, but sometimes the OR schedule or the surgeon's schedule prevents [place] from making those accommodations.  If you have not heard from our office [phone]) in 5 working days, call the office and ask for your surgeon's nurse.  If you have other questions about your diagnosis, plan, or surgery, call the office and ask for your surgeon's nurse.  Pt Education - CCS Pilonidal Disease (AT) The anatomy of the intragluteal cleft was discussed. Pathophysiology of pilonidal disease was discussed. The importance of keeping hairs trimmed to avoid recurrence was discussed. Discussion of options such as curretage, excision with closure vs leaving open was discussed. Risks of infection with need for incision and drainage & antibiotics were discussed. I noted a good likelihood this will help address the problem.  At this point, I think the patient would best served with considering surgery to excise the diseased tissue. I will make an attempt to close  but it may need to be left open to allow it to heal with secondary intention and wound packing. Possible recurrences need reoperation or different techniques were discussed as well. I noted that recurrence is higher with poor compliance on hair removal hygiene & overall health. The patient's questions were answered. The patient agrees to proceed.  Ardeth Sportsman, MD, FACS, MASCRS Gastrointestinal and Minimally Invasive Surgery    [address] [place], [place] [address] [phone] Main / Paging [phone] Fax

## 2018-09-02 MED ORDER — BUPIVACAINE LIPOSOME 1.3 % IJ SUSP
20.0000 mL | Freq: Once | INTRAMUSCULAR | Status: DC
  Filled 2018-09-02: qty 20

## 2018-09-03 ENCOUNTER — Ambulatory Visit (HOSPITAL_COMMUNITY): Payer: BLUE CROSS/BLUE SHIELD | Admitting: Certified Registered Nurse Anesthetist

## 2018-09-03 ENCOUNTER — Encounter (HOSPITAL_COMMUNITY)
Admission: RE | Disposition: A | Discharge status: Home / Self Care | Payer: Self-pay | Source: Home / Self Care | Attending: Surgery

## 2018-09-03 ENCOUNTER — Ambulatory Visit (HOSPITAL_COMMUNITY)
Admission: RE | Admit: 2018-09-03 | Discharge: 2018-09-03 | Disposition: A | Discharge status: Home / Self Care | Payer: BLUE CROSS/BLUE SHIELD | Attending: Surgery | Admitting: Surgery

## 2018-09-03 ENCOUNTER — Encounter (HOSPITAL_COMMUNITY): Payer: Self-pay

## 2018-09-03 DIAGNOSIS — G709 Myoneural disorder, unspecified: Secondary | ICD-10-CM | POA: Insufficient documentation

## 2018-09-03 DIAGNOSIS — K219 Gastro-esophageal reflux disease without esophagitis: Secondary | ICD-10-CM | POA: Insufficient documentation

## 2018-09-03 DIAGNOSIS — Z981 Arthrodesis status: Secondary | ICD-10-CM | POA: Insufficient documentation

## 2018-09-03 DIAGNOSIS — Z79899 Other long term (current) drug therapy: Secondary | ICD-10-CM | POA: Insufficient documentation

## 2018-09-03 DIAGNOSIS — M1990 Unspecified osteoarthritis, unspecified site: Secondary | ICD-10-CM | POA: Insufficient documentation

## 2018-09-03 DIAGNOSIS — F17210 Nicotine dependence, cigarettes, uncomplicated: Secondary | ICD-10-CM | POA: Insufficient documentation

## 2018-09-03 DIAGNOSIS — L0591 Pilonidal cyst without abscess: Secondary | ICD-10-CM | POA: Insufficient documentation

## 2018-09-03 DIAGNOSIS — L988 Other specified disorders of the skin and subcutaneous tissue: Secondary | ICD-10-CM | POA: Diagnosis present

## 2018-09-03 HISTORY — PX: PILONIDAL CYST EXCISION: SHX744

## 2018-09-03 HISTORY — DX: Other complications of anesthesia, initial encounter: T88.59XA

## 2018-09-03 HISTORY — DX: Adverse effect of unspecified anesthetic, initial encounter: T41.45XA

## 2018-09-03 LAB — CBC
HCT: 45.9 % (ref 39.0–52.0)
Hemoglobin: 15.6 g/dL (ref 13.0–17.0)
MCH: 31.5 pg (ref 26.0–34.0)
MCHC: 34 g/dL (ref 30.0–36.0)
MCV: 92.7 fL (ref 80.0–100.0)
Platelets: 241 10*3/uL (ref 150–400)
RBC: 4.95 MIL/uL (ref 4.22–5.81)
RDW: 12.5 % (ref 11.5–15.5)
WBC: 7.5 10*3/uL (ref 4.0–10.5)
nRBC: 0 % (ref 0.0–0.2)

## 2018-09-03 SURGERY — EXCISION, SIMPLE PILONIDAL CYST
Anesthesia: General | Site: Buttocks

## 2018-09-03 MED ORDER — OXYCODONE HCL 5 MG PO TABS: 5.0000 mg | ORAL_TABLET | Freq: Four times a day (QID) | ORAL | 0 refills | Status: DC | PRN

## 2018-09-03 MED ORDER — LIDOCAINE 2% (20 MG/ML) 5 ML SYRINGE
INTRAMUSCULAR | Status: DC | PRN
  Administered 2018-09-03: 1 mg/kg/h via INTRAVENOUS

## 2018-09-03 MED ORDER — DEXAMETHASONE SODIUM PHOSPHATE 10 MG/ML IJ SOLN
INTRAMUSCULAR | Status: AC
  Filled 2018-09-03: qty 1

## 2018-09-03 MED ORDER — PROPOFOL 10 MG/ML IV BOLUS
INTRAVENOUS | Status: AC
  Filled 2018-09-03: qty 20

## 2018-09-03 MED ORDER — LACTATED RINGERS IV SOLN
INTRAVENOUS | Status: DC | PRN
  Administered 2018-09-03 (×2): via INTRAVENOUS

## 2018-09-03 MED ORDER — ONDANSETRON HCL 4 MG/2ML IJ SOLN
INTRAMUSCULAR | Status: AC
  Filled 2018-09-03: qty 2

## 2018-09-03 MED ORDER — MIDAZOLAM HCL 2 MG/2ML IJ SOLN
INTRAMUSCULAR | Status: AC
  Filled 2018-09-03: qty 2

## 2018-09-03 MED ORDER — KETAMINE HCL 10 MG/ML IJ SOLN
INTRAMUSCULAR | Status: DC | PRN
  Administered 2018-09-03: 30 mg via INTRAVENOUS

## 2018-09-03 MED ORDER — CHLORHEXIDINE GLUCONATE CLOTH 2 % EX PADS: 6.0000 | MEDICATED_PAD | Freq: Once | CUTANEOUS | Status: DC

## 2018-09-03 MED ORDER — PROPOFOL 10 MG/ML IV BOLUS
INTRAVENOUS | Status: DC | PRN
  Administered 2018-09-03: 200 mg via INTRAVENOUS

## 2018-09-03 MED ORDER — SUGAMMADEX SODIUM 200 MG/2ML IV SOLN
INTRAVENOUS | Status: DC | PRN
  Administered 2018-09-03: 200 mg via INTRAVENOUS

## 2018-09-03 MED ORDER — MIDAZOLAM HCL 5 MG/5ML IJ SOLN
INTRAMUSCULAR | Status: DC | PRN
  Administered 2018-09-03: 2 mg via INTRAVENOUS

## 2018-09-03 MED ORDER — GABAPENTIN 300 MG PO CAPS
300.0000 mg | ORAL_CAPSULE | ORAL | Status: AC
  Administered 2018-09-03: 300 mg via ORAL
  Filled 2018-09-03: qty 1

## 2018-09-03 MED ORDER — CEFAZOLIN SODIUM-DEXTROSE 2-4 GM/100ML-% IV SOLN
2.0000 g | INTRAVENOUS | Status: AC
  Administered 2018-09-03: 2 g via INTRAVENOUS
  Filled 2018-09-03: qty 100

## 2018-09-03 MED ORDER — LABETALOL HCL 5 MG/ML IV SOLN
INTRAVENOUS | Status: AC
  Filled 2018-09-03: qty 4

## 2018-09-03 MED ORDER — ONDANSETRON HCL 4 MG/2ML IJ SOLN
INTRAMUSCULAR | Status: DC | PRN
  Administered 2018-09-03: 4 mg via INTRAVENOUS

## 2018-09-03 MED ORDER — FENTANYL CITRATE (PF) 250 MCG/5ML IJ SOLN
INTRAMUSCULAR | Status: AC
  Filled 2018-09-03: qty 5

## 2018-09-03 MED ORDER — ROCURONIUM BROMIDE 50 MG/5ML IV SOSY
PREFILLED_SYRINGE | INTRAVENOUS | Status: DC | PRN
  Administered 2018-09-03: 40 mg via INTRAVENOUS

## 2018-09-03 MED ORDER — GABAPENTIN 300 MG PO CAPS: 300.0000 mg | ORAL_CAPSULE | Freq: Two times a day (BID) | ORAL | 2 refills | Status: DC

## 2018-09-03 MED ORDER — ONDANSETRON HCL 4 MG/2ML IJ SOLN: 4.0000 mg | Freq: Once | INTRAMUSCULAR | Status: DC | PRN

## 2018-09-03 MED ORDER — NAPROXEN 500 MG PO TABS: 500.0000 mg | ORAL_TABLET | Freq: Two times a day (BID) | ORAL | 1 refills | Status: DC | PRN

## 2018-09-03 MED ORDER — METRONIDAZOLE IN NACL 5-0.79 MG/ML-% IV SOLN
500.0000 mg | INTRAVENOUS | Status: AC
  Administered 2018-09-03: 500 mg via INTRAVENOUS
  Filled 2018-09-03: qty 100

## 2018-09-03 MED ORDER — FENTANYL CITRATE (PF) 250 MCG/5ML IJ SOLN
INTRAMUSCULAR | Status: DC | PRN
  Administered 2018-09-03: 50 ug via INTRAVENOUS
  Administered 2018-09-03: 100 ug via INTRAVENOUS
  Administered 2018-09-03: 50 ug via INTRAVENOUS

## 2018-09-03 MED ORDER — DEXAMETHASONE SODIUM PHOSPHATE 10 MG/ML IJ SOLN
INTRAMUSCULAR | Status: DC | PRN
  Administered 2018-09-03: 7 mg via INTRAVENOUS

## 2018-09-03 MED ORDER — LIDOCAINE 2% (20 MG/ML) 5 ML SYRINGE
INTRAMUSCULAR | Status: DC | PRN
  Administered 2018-09-03: 100 mg via INTRAVENOUS

## 2018-09-03 MED ORDER — BUPIVACAINE HCL (PF) 0.25 % IJ SOLN
INTRAMUSCULAR | Status: AC
  Filled 2018-09-03: qty 30

## 2018-09-03 MED ORDER — FENTANYL CITRATE (PF) 100 MCG/2ML IJ SOLN: 25.0000 ug | INTRAMUSCULAR | Status: DC | PRN

## 2018-09-03 MED ORDER — BUPIVACAINE LIPOSOME 1.3 % IJ SUSP
INTRAMUSCULAR | Status: DC | PRN
  Administered 2018-09-03: 20 mL

## 2018-09-03 MED ORDER — SUGAMMADEX SODIUM 200 MG/2ML IV SOLN
INTRAVENOUS | Status: AC
  Filled 2018-09-03: qty 2

## 2018-09-03 MED ORDER — ACETAMINOPHEN 500 MG PO TABS
1000.0000 mg | ORAL_TABLET | ORAL | Status: AC
  Administered 2018-09-03: 1000 mg via ORAL
  Filled 2018-09-03: qty 2

## 2018-09-03 MED ORDER — LABETALOL HCL 5 MG/ML IV SOLN
5.0000 mg | Freq: Once | INTRAVENOUS | Status: AC
  Administered 2018-09-03: 5 mg via INTRAVENOUS

## 2018-09-03 MED ORDER — BUPIVACAINE HCL 0.25 % IJ SOLN
INTRAMUSCULAR | Status: DC | PRN
  Administered 2018-09-03: 30 mL

## 2018-09-03 MED ORDER — LIDOCAINE 2% (20 MG/ML) 5 ML SYRINGE
INTRAMUSCULAR | Status: AC
  Filled 2018-09-03: qty 5

## 2018-09-03 SURGICAL SUPPLY — 37 items
BRIEF STRETCH FOR OB PAD LRG (UNDERPADS AND DIAPERS) ×2 IMPLANT
CHLORAPREP W/TINT 26ML (MISCELLANEOUS) ×2 IMPLANT
COVER SURGICAL LIGHT HANDLE (MISCELLANEOUS) ×2 IMPLANT
COVER WAND RF STERILE (DRAPES) ×2 IMPLANT
DERMABOND ADVANCED (GAUZE/BANDAGES/DRESSINGS) ×1
DERMABOND ADVANCED .7 DNX12 (GAUZE/BANDAGES/DRESSINGS) ×1 IMPLANT
DRAPE LAPAROTOMY T 102X78X121 (DRAPES) ×2 IMPLANT
DRSG PAD ABDOMINAL 8X10 ST (GAUZE/BANDAGES/DRESSINGS) ×2 IMPLANT
DRSG XEROFORM 1X8 (GAUZE/BANDAGES/DRESSINGS) ×2 IMPLANT
ELECT PENCIL ROCKER SW 15FT (MISCELLANEOUS) ×2 IMPLANT
ELECT REM PT RETURN 15FT ADLT (MISCELLANEOUS) ×2 IMPLANT
GAUZE 4X4 16PLY RFD (DISPOSABLE) ×2 IMPLANT
GAUZE PACKING 1/2X5YD (GAUZE/BANDAGES/DRESSINGS) ×2 IMPLANT
GAUZE SPONGE 4X4 12PLY STRL (GAUZE/BANDAGES/DRESSINGS) ×2 IMPLANT
GLOVE BIOGEL PI IND STRL 6.5 (GLOVE) ×1 IMPLANT
GLOVE BIOGEL PI IND STRL 7.0 (GLOVE) ×1 IMPLANT
GLOVE BIOGEL PI INDICATOR 6.5 (GLOVE) ×1
GLOVE BIOGEL PI INDICATOR 7.0 (GLOVE) ×1
GLOVE ECLIPSE 6.5 STRL STRAW (GLOVE) ×2 IMPLANT
GLOVE ECLIPSE 8.0 STRL XLNG CF (GLOVE) ×2 IMPLANT
GLOVE INDICATOR 8.0 STRL GRN (GLOVE) ×2 IMPLANT
GLOVE SURG SS PI 7.0 STRL IVOR (GLOVE) ×2 IMPLANT
GOWN STRL REUS W/TWL XL LVL3 (GOWN DISPOSABLE) ×6 IMPLANT
KIT BASIN OR (CUSTOM PROCEDURE TRAY) ×2 IMPLANT
NEEDLE HYPO 22GX1.5 SAFETY (NEEDLE) ×2 IMPLANT
PACK BASIC VI WITH GOWN DISP (CUSTOM PROCEDURE TRAY) ×2 IMPLANT
SUT MNCRL AB 3-0 PS2 18 (SUTURE) ×2 IMPLANT
SUT MNCRL AB 4-0 PS2 18 (SUTURE) IMPLANT
SUT SILK 2 0 SH (SUTURE) ×2 IMPLANT
SUT VIC AB 2-0 SH 18 (SUTURE) ×4 IMPLANT
SUT VIC AB 2-0 SH 27 (SUTURE)
SUT VIC AB 2-0 SH 27X BRD (SUTURE) IMPLANT
SUT VIC AB 3-0 SH 18 (SUTURE) IMPLANT
SYR 20CC LL (SYRINGE) ×2 IMPLANT
TOWEL OR 17X26 10 PK STRL BLUE (TOWEL DISPOSABLE) ×2 IMPLANT
TOWEL OR NON WOVEN STRL DISP B (DISPOSABLE) ×2 IMPLANT
YANKAUER SUCT BULB TIP 10FT TU (MISCELLANEOUS) ×2 IMPLANT

## 2018-09-03 NOTE — Op Note (Signed)
09/03/2018  12:46 PM  PATIENT:  Shaun Camacho  33 y.o. male  Patient Care Team: Monica Becton, MD as PCP - General (Family Medicine) Karie Soda, MD as Consulting Physician (General Surgery) Estill Bamberg, MD as Consulting Physician (Orthopedic Surgery)  PRE-OPERATIVE DIAGNOSIS:  Pilonidal disease  POST-OPERATIVE DIAGNOSIS:  Pilonidal disease  PROCEDURE:  EXCISION OF PILONIDAL DISEASE  SURGEON:  Ardeth Sportsman, MD  ASSISTANT: RN   ANESTHESIA:   local and general  EBL:  Total I/O In: 1000 [I.V.:1000] Out: -   Delay start of Pharmacological VTE agent (>24hrs) due to surgical blood loss or risk of bleeding:  no  DRAINS:  2 WICKS (umbilical tape) IN THE LOWER BACK secured with silk suture  1.  Right =superificial SQ 2.  Left = deep   SPECIMEN:  PILONIDAL DISEASE  DISPOSITION OF SPECIMEN:  PATHOLOGY  COUNTS:  YES  PLAN OF CARE: Discharge to home after PACU  PATIENT DISPOSITION:  PACU - hemodynamically stable.  INDICATION: Pleasant person with chronic drainage in the intergluteal space and pits suspicious for pilonidal disease.  I recommended excision with layered closure over wicks:  The anatomy of the intragluteal cleft was discussed. Pathophysiology of pilonidal disease was discussed. The importance of keeping hairs trimmed to avoid recurrence was discussed. Discussion of options such as curretage, excision with closure vs leaving open was discussed. Risks of infection with need for incision and drainage & antibiotics were discussed.  I noted a good likelihood this will help address the problem.    At this point, I think the patient would best served with considering surgery to excise the diseased tissue. I will make an attempt to close but it may need to be left open to allow it to heal with secondary intention and wound packing. Possible recurrences need reoperation or different techniques were discussed as well. I noted that recurrence is higher with  poor compliance on hair removal hygiene & overall health.  Risks of bleeding, infection, injury to other organs, need for repair of tissues / organs, need for further treatment, and other risks discussed. The patient's questions were answered. The patient agrees to proceed.  OR FINDINGS: Upper intragluteal upper midline pits consistent with chronic pilonidal fistulous disease carrying to right inner buttock.   5x4x3cm No abscess.  Layered closure with absorbable suture.  Wicks as noted above.  The right lower back wick is the superficial one.  That is the one that should be removed first in 10 days.  DESCRIPTION:   Informed consent was confirmed.  Patient underwent general anesthesia without difficulty.  She was positioned prone.  The lower back and intergluteal region was prepped and draped in the sterile fashion.  Excess hair is removed.  Surgical timeout confirmed our plan.  I did examination with findings noted above.   I did a paramedian vertical fusiform excision of the pilonidal disease.  I came down to the sacrum to remove all scar tissue and sinus tracts/pilonidal disease.  Specimen removed intact.  I created deep flaps off the skin and subcutaneous tissue fascia and even superficial gluteus muscle off the sacrum 4 centimeters laterally on both sides to have good mobility for thick flaps.  I did copious irrigation after I had assured hemostasis.  I placed a deep wick overlying the sacrum.  I closed at the level of the gluteal fascia and muscle using interrupted 2-0 Vicryl suture to good result.  I few tacked to the sacral periosteum to close the dead space.  I placed a wick in the superficial region.  I did another layer closure of absorbable Vicryl suture deep dermal sutures interrupted.  I then closed the skin with a running Monocryl subcuticular suture.  I then used Dermabond skin glue to seal the incision.  I had secured the wicks with silk suture.  I placed a sterile dressing over the  wicks.  Patient being extubated go to recovery room.  I had discussed postop care with the patient in the holding area.  Patient had received instructions in the office.  I discussed operative findings, updated the patient's status, discussed probable steps to recovery, and gave postoperative recommendations to the patient's father.  Recommendations were made.  Questions were answered.  He expressed understanding & appreciation.  Rx for gabapentin & naproxen written (pt does not want narcotics).  Ardeth Sportsman, M.D., F.A.C.S. Gastrointestinal and Minimally Invasive Surgery [facility] [address] [place], [place] [address] [phone] Main / Paging

## 2018-09-03 NOTE — Discharge Instructions (Signed)
GENERAL SURGERY: POST OP INSTRUCTIONS  ######################################################################  EAT Gradually transition to a high fiber diet with a fiber supplement over the next few weeks after discharge.  Start with a pureed / full liquid diet (see below)  WALK Walk an hour a day.  Control your pain to do that.    CONTROL PAIN Control pain so that you can walk, sleep, tolerate sneezing/coughing, go up/down stairs.  HAVE A BOWEL MOVEMENT DAILY Keep your bowels regular to avoid problems.  OK to try a laxative to override constipation.  OK to use an antidairrheal to slow down diarrhea.  Call if not better after 2 tries  CALL IF YOU HAVE PROBLEMS/CONCERNS Call if you are still struggling despite following these instructions. Call if you have concerns not answered by these instructions  ######################################################################    1. DIET: Follow a light bland diet the first 24 hours after arrival home, such as soup, liquids, crackers, etc.  Be sure to include lots of fluids daily.  Avoid fast food or heavy meals as your are more likely to get nauseated.   2. Take your usually prescribed home medications unless otherwise directed. 3. PAIN CONTROL: a. Pain is best controlled by a usual combination of three different methods TOGETHER: i. Ice/Heat ii. Over the counter pain medication iii. Prescription pain medication b. Most patients will experience some swelling and bruising around the incisions.  Ice packs or heating pads (30-60 minutes up to 6 times a day) will help. Use ice for the first few days to help decrease swelling and bruising, then switch to heat to help relax tight/sore spots and speed recovery.  Some people prefer to use ice alone, heat alone, alternating between ice & heat.  Experiment to what works for you.  Swelling and bruising can take several weeks to resolve.   c. It is helpful to take an over-the-counter pain medication  regularly for the first few weeks.  Choose one of the following that works best for you: i. Naproxen (Aleve, etc)  Two 220mg  tabs twice a day ii. Ibuprofen (Advil, etc) Three 200mg  tabs four times a day (every meal & bedtime) iii. Acetaminophen (Tylenol, etc) 500-650mg  four times a day (every meal & bedtime) d. A  prescription for pain medication (such as oxycodone, hydrocodone, etc) should be given to you upon discharge.  Take your pain medication as prescribed.  i. If you are having problems/concerns with the prescription medicine (does not control pain, nausea, vomiting, rash, itching, etc), please call [phone] to see if we need to switch you to a different pain medicine that will work better for you and/or control your side effect better. ii. If you need a refill on your pain medication, please contact your pharmacy.  They will contact our office to request authorization. Prescriptions will not be filled after 5 pm or on week-ends. 4. Avoid getting constipated.  Between the surgery and the pain medications, it is common to experience some constipation.  Increasing fluid intake and taking a fiber supplement (such as Metamucil, Citrucel, FiberCon, MiraLax, etc) 1-2 times a day regularly will usually help prevent this problem from occurring.  A mild laxative (prune juice, Milk of Magnesia, MiraLax, etc) should be taken according to package directions if there are no bowel movements after 48 hours.    5. Wash / shower every day.  You may shower over the dressings as they are waterproof.  Continue to shower over incision(s) after the dressing is off.    6.  WOUND CARE Remove your outside bandages the next day after surgery.    If your incision ois closed, you may leave most of your lower incision (intergluteal cleft over tailbone) open to air.  You will have purple Dermabond plastic scab or skin tapes (Steri Strips) covering the incision.  Leave them on until one week, then remove.    YOU  HAVE SMALL RIBBON WICKS above the upper part of your tailbone incision on your lower back that will allow the deeper part of the closed incision to drain & close down.   There will be drainage from the wicks, usually thin & bloody.  Replace clean 4x4 gauze to cover the wicks that drain at least every day (remove dressing, shower/bathe, dry skin, replace a new gauze dressing).  Replace the dressing more often as needed Keep the area clean & dry.   You will need to come to the office to have the wicks removed by an office nurse.   Usually: -return to [facility] office 1 week from surgery to have superficial (RIGHT) wick removed by office nurse  -return to [facility] office 2nd week after surgery to have the deeper (LEFT) wick removed by office nurse  -return to me, Dr Michaell Cowing, ~ 3 weeks to check on incision.     7. ACTIVITIES as tolerated:   a. You may resume regular (light) daily activities beginning the next day--such as daily self-care, walking, climbing stairs--gradually increasing activities as tolerated.  If you can walk 30 minutes without difficulty, it is safe to try more intense activity such as jogging, treadmill, bicycling, low-impact aerobics, swimming, etc. b. Save the most intensive and strenuous activity for last such as sit-ups, heavy lifting, contact sports, etc  Refrain from any heavy lifting or straining until you are off narcotics for pain control.   c. DO NOT PUSH THROUGH PAIN.  Let pain be your guide: If it hurts to do something, don't do it.  Pain is your body warning you to avoid that activity for another week until the pain goes down. d. You may drive when you are no longer taking prescription pain medication, you can comfortably wear a seatbelt, and you can safely maneuver your car and apply brakes. e. Bonita Quin may have sexual intercourse when it is comfortable.  8. FOLLOW UP in our office a. Please call [facility] at [phone] to set up an appointment to see your surgeon in the office for a  follow-up appointment approximately 7-10 days after your surgery to have one of your ribbon wicks removed by the office nurse.  The other remaining wick usually is removed the next week after that when you meet your surgeon again for a post-operative visit. b. Make sure that you call for these appointments the day you arrive home to ensure a convenient appointment time. 9. IF YOU HAVE DISABILITY OR FAMILY LEAVE FORMS, BRING THEM TO THE OFFICE FOR PROCESSING.  DO NOT GIVE THEM TO YOUR DOCTOR.   WHEN TO CALL us [phone]: 1. Poor pain control 2. Reactions / problems with new medications (rash/itching, nausea, etc)  3. Fever over 101.5 F (38.5 C) 4. Worsening swelling or bruising 5. Continued bleeding from incision. 6. Increased pain, redness, or drainage from the incision 7. Difficulty breathing / swallowing   The clinic staff is available to answer your questions during regular business hours (8:30am-5pm).  Please dont hesitate to call and ask to speak to one of our nurses for clinical concerns.   If you have  a medical emergency, go to the nearest emergency room or call 911.  A surgeon from [facility] is always on call at the [facility], [place] [address], [place], [place]  [address] ? MAIN: [phone] ? TOLL FREE: [phone] ?  FAX [phone] [email or web address]

## 2018-09-03 NOTE — Interval H&P Note (Signed)
History and Physical Interval Note:  09/03/2018 9:34 AM  Shaun Camacho  has presented today for surgery, with the diagnosis of pilonindal disease  The various methods of treatment have been discussed with the patient and family. After consideration of risks, benefits and other options for treatment, the patient has consented to  Procedure(s): EXCISION OF PILONIDAL DISEASE (N/A) as a surgical intervention .  The patient's history has been reviewed, patient examined, no change in status, stable for surgery.  I have reviewed the patient's chart and labs.  Questions were answered to the patient's satisfaction.    I have re-reviewed the the patient's records, history, medications, and allergies.  I have re-examined the patient.  I again discussed intraoperative plans and goals of post-operative recovery.  The patient agrees to proceed.  Shaun Camacho  Apr 17, 1986 [id number]  Patient Care Team: Monica Becton, MD as PCP - General (Family Medicine) Karie Soda, MD as Consulting Physician (General Surgery) Estill Bamberg, MD as Consulting Physician (Orthopedic Surgery)  Patient Active Problem List   Diagnosis Date Noted  . Polyarthralgia 08/13/2018  . Bilateral hand pain 08/13/2018  . Acute right-sided low back pain 05/25/2018  . Pilonidal disease 07/15/2017  . Anxiety and depression 08/23/2016  . Lateral epicondylitis, right elbow 08/23/2016  . History of fusion of cervical spine 03/22/2016  . Male hypogonadism 01/12/2016  . Annual physical exam 12/29/2015  . GERD (gastroesophageal reflux disease) 12/29/2015    Past Medical History:  Diagnosis Date  . Anxiety and depression 08/23/2016   08/23/2016 PHQ9 = 6, GAD7 = 7 09/12/2016 PHQ9 = 6, GAD7 = 3 10/11/2016 PHQ9 = 1, GAD7 = 1 12/27/2016 PHQ9 = 3, GAD7 = 4 01/24/2017 PHQ9 = 2, GAD7 = 5  . Arthritis   . Complication of anesthesia    when wakes up gets agitated   . GERD (gastroesophageal reflux disease) 12/29/2015  . Male  hypogonadism 01/12/2016  . Radiculitis of right cervical region 03/22/2016   MRI from [facility] shows moderate to severe right C6-C7 neuroforaminal stenosis with mass-effect on the exiting right C7 nerve root.    Past Surgical History:  Procedure Laterality Date  . ANTERIOR CERVICAL DECOMP/DISCECTOMY FUSION N/A 07/15/2018   Procedure: ANTERIOR CERVICAL DECOMPRESSION FUSION, CERVICAL FIVE-SIX, CERVICAL SIX-SEVEN WITH INSTRUMENTATION AND ALLOGRAFT;  Surgeon: Estill Bamberg, MD;  Location: [facility] OR;  Service: Orthopedics;  Laterality: N/A;  . SURGERY SCROTAL / TESTICULAR      Social History   Socioeconomic History  . Marital status: Married    Spouse name: Not on file  . Number of children: Not on file  . Years of education: Not on file  . Highest education level: Not on file  Occupational History  . Not on file  Social Needs  . Financial resource strain: Not on file  . Food insecurity:    Worry: Not on file    Inability: Not on file  . Transportation needs:    Medical: Not on file    Non-medical: Not on file  Tobacco Use  . Smoking status: Current Every Day Smoker    Packs/day: 0.50    Years: 18.00    Pack years: 9.00  . Smokeless tobacco: Current User    Types: Chew  Substance and Sexual Activity  . Alcohol use: Not on file    Comment: rare  . Drug use: No  . Sexual activity: Not on file  Lifestyle  . Physical activity:    Days per week: Not on file  Minutes per session: Not on file  . Stress: Not on file  Relationships  . Social connections:    Talks on phone: Not on file    Gets together: Not on file    Attends religious service: Not on file    Active member of club or organization: Not on file    Attends meetings of clubs or organizations: Not on file    Relationship status: Not on file  . Intimate partner violence:    Fear of current or ex partner: Not on file    Emotionally abused: Not on file    Physically abused: Not on file    Forced sexual activity: Not  on file  Other Topics Concern  . Not on file  Social History Narrative  . Not on file    Family History  Problem Relation Age of Onset  . Thyroid disease Father   . Thyroid disease Sister   . Diabetes Maternal Grandmother     Medications Prior to Admission  Medication Sig Dispense Refill Last Dose  . acetaminophen (TYLENOL) 500 MG tablet Take 1,000 mg by mouth every 6 (six) hours as needed (for pain.).     Marland Kitchen omeprazole (PRILOSEC) 40 MG capsule TAKE 1 CAPSULE BY MOUTH EVERY DAY (Patient taking differently: Take 40 mg by mouth daily before breakfast. ) 90 capsule 0 Taking  . testosterone cypionate (DEPOTESTOSTERONE CYPIONATE) 200 MG/ML injection INJECT 1 ML IN THE MUSCLE EVERY 14 DAYS (Patient taking differently: Inject 200 mg into the muscle every 14 (fourteen) days. Every other Friday) 10 mL 0 Taking  . B-D 3CC LUER-LOK SYR 22GX1-1/2 22G X 1-1/2" 3 ML MISC USE AS DIRECTED 10 each 0 Taking  . BD HYPODERMIC NEEDLE 18G X 1" MISC USE AS DIRECTED 10 each 0 Taking  . nitroGLYCERIN (NITRODUR - DOSED IN MG/24 HR) 0.2 mg/hr patch Cut and apply 1/4 patch to most painful area q24h. (Patient not taking: Reported on 09/02/2018) 30 patch 11 Not Taking at Unknown time  . pregabalin (LYRICA) 50 MG capsule Take 1 capsule (50 mg total) by mouth at bedtime. (Patient not taking: Reported on 09/02/2018) 30 capsule 3 Not Taking at Unknown time    Current Facility-Administered Medications  Medication Dose Route Frequency Provider Last Rate Last Dose  . acetaminophen (TYLENOL) tablet 1,000 mg  1,000 mg Oral On Call to OR Karie Soda, MD      . bupivacaine liposome (EXPAREL) 1.3 % injection 266 mg  20 mL Infiltration Once Karie Soda, MD      . ceFAZolin (ANCEF) IVPB 2g/100 mL premix  2 g Intravenous On Call to OR Karie Soda, MD       And  . metroNIDAZOLE (FLAGYL) IVPB 500 mg  500 mg Intravenous On Call to OR Karie Soda, MD      . Chlorhexidine Gluconate Cloth 2 % PADS 6 each  6 each Topical Once  Karie Soda, MD       And  . Chlorhexidine Gluconate Cloth 2 % PADS 6 each  6 each Topical Once Karie Soda, MD      . gabapentin (NEURONTIN) capsule 300 mg  300 mg Oral On Call to OR Karie Soda, MD         Allergies  Allergen Reactions  . Tramadol Palpitations  . Meloxicam Other (See Comments)    Somnolence    BP (!) 146/86   Pulse 75   Temp 98.4 F (36.9 C) (Oral)   Resp 18   Ht 5'  10" (1.778 m)   Wt 92.6 kg   SpO2 97%   BMI 29.31 kg/m   Labs: No results found for this or any previous visit (from the past 48 hour(s)).  Imaging / Studies: No results found.   Ardeth Sportsman, M.D., F.A.C.S. Gastrointestinal and Minimally Invasive Surgery [facility] [address], [address] [place], [place] [address] [phone] Main / Paging  09/03/2018 9:34 AM    Ardeth Sportsman

## 2018-09-03 NOTE — Anesthesia Postprocedure Evaluation (Signed)
Anesthesia Post Note  Patient: Shaun Camacho  Procedure(s) Performed: EXCISION OF PILONIDAL DISEASE (N/A Buttocks)     Patient location during evaluation: PACU Anesthesia Type: General Level of consciousness: awake and alert Pain management: pain level controlled Vital Signs Assessment: post-procedure vital signs reviewed and stable Respiratory status: spontaneous breathing, nonlabored ventilation, respiratory function stable and patient connected to nasal cannula oxygen Cardiovascular status: blood pressure returned to baseline and stable Postop Assessment: no apparent nausea or vomiting Anesthetic complications: no    Last Vitals:  Vitals:   09/03/18 1345 09/03/18 1400  BP: (!) 137/101 (!) 139/96  Pulse: 73 72  Resp: 16 18  Temp:  37 C  SpO2: 98% 98%    Last Pain:  Vitals:   09/03/18 1400  TempSrc:   PainSc: 0-No pain                 Kennieth Rad

## 2018-09-03 NOTE — Anesthesia Procedure Notes (Signed)
Procedure Name: Intubation Date/Time: 09/03/2018 11:38 AM Performed by: Maxwell Caul, CRNA Pre-anesthesia Checklist: Patient identified, Emergency Drugs available, Suction available and Patient being monitored Patient Re-evaluated:Patient Re-evaluated prior to induction Oxygen Delivery Method: Circle system utilized Preoxygenation: Pre-oxygenation with 100% oxygen Induction Type: IV induction Ventilation: Mask ventilation without difficulty Laryngoscope Size: Mac and 4 Grade View: Grade I Tube type: Oral Tube size: 7.5 mm Number of attempts: 1 Airway Equipment and Method: Stylet Placement Confirmation: ETT inserted through vocal cords under direct vision,  positive ETCO2 and breath sounds checked- equal and bilateral Secured at: 22 cm Tube secured with: Tape Dental Injury: Teeth and Oropharynx as per pre-operative assessment

## 2018-09-03 NOTE — Anesthesia Preprocedure Evaluation (Signed)
Anesthesia Evaluation  Patient identified by MRN, date of birth, ID band Patient awake    Reviewed: Allergy & Precautions, NPO status , Patient's Chart, lab work & pertinent test results  Airway Mallampati: II  TM Distance: >3 FB Neck ROM: Full    Dental  (+) Dental Advisory Given   Pulmonary Current Smoker,    breath sounds clear to auscultation       Cardiovascular negative cardio ROS   Rhythm:Regular Rate:Normal     Neuro/Psych Anxiety Depression  Neuromuscular disease    GI/Hepatic Neg liver ROS, GERD  ,  Endo/Other  negative endocrine ROS  Renal/GU negative Renal ROS     Musculoskeletal  (+) Arthritis ,   Abdominal   Peds  Hematology negative hematology ROS (+)   Anesthesia Other Findings   Reproductive/Obstetrics                             Lab Results  Component Value Date   WBC 7.3 08/18/2018   HGB 16.9 08/18/2018   HCT 47.7 08/18/2018   MCV 90.0 08/18/2018   PLT 262 08/18/2018   Lab Results  Component Value Date   CREATININE 1.18 08/13/2018   BUN 13 08/13/2018   NA 139 08/13/2018   K 3.9 08/13/2018   CL 100 08/13/2018   CO2 30 08/13/2018    Anesthesia Physical Anesthesia Plan  ASA: II  Anesthesia Plan: General   Post-op Pain Management:    Induction: Intravenous  PONV Risk Score and Plan: 1 and Dexamethasone and Ondansetron  Airway Management Planned: Oral ETT and LMA  Additional Equipment:   Intra-op Plan:   Post-operative Plan: Extubation in OR  Informed Consent: I have reviewed the patients History and Physical, chart, labs and discussed the procedure including the risks, benefits and alternatives for the proposed anesthesia with the patient or authorized representative who has indicated his/her understanding and acceptance.     Dental advisory given  Plan Discussed with:   Anesthesia Plan Comments:         Anesthesia Quick  Evaluation

## 2018-09-03 NOTE — Transfer of Care (Signed)
Immediate Anesthesia Transfer of Care Note  Patient: Shaun Camacho  Procedure(s) Performed: EXCISION OF PILONIDAL DISEASE (N/A Buttocks)  Patient Location: PACU  Anesthesia Type:General  Level of Consciousness: awake, alert  and oriented  Airway & Oxygen Therapy: Patient Spontanous Breathing and Patient connected to face mask oxygen  Post-op Assessment: Report given to RN and Post -op Vital signs reviewed and stable  Post vital signs: Reviewed and stable  Last Vitals:  Vitals Value Taken Time  BP 155/105 09/03/2018  1:00 PM  Temp    Pulse 94 09/03/2018  1:03 PM  Resp 17 09/03/2018  1:03 PM  SpO2 100 % 09/03/2018  1:03 PM  Vitals shown include unvalidated device data.  Last Pain:  Vitals:   09/03/18 1014  TempSrc:   PainSc: 0-No pain         Complications: No apparent anesthesia complications

## 2018-09-04 ENCOUNTER — Other Ambulatory Visit: Payer: Self-pay | Admitting: Sports Medicine

## 2018-09-04 ENCOUNTER — Encounter (HOSPITAL_COMMUNITY): Payer: Self-pay | Admitting: Surgery

## 2018-09-10 ENCOUNTER — Ambulatory Visit (INDEPENDENT_AMBULATORY_CARE_PROVIDER_SITE_OTHER): Payer: BLUE CROSS/BLUE SHIELD | Admitting: Sports Medicine

## 2018-09-10 ENCOUNTER — Ambulatory Visit (INDEPENDENT_AMBULATORY_CARE_PROVIDER_SITE_OTHER): Payer: BLUE CROSS/BLUE SHIELD

## 2018-09-10 ENCOUNTER — Encounter: Payer: Self-pay | Admitting: Sports Medicine

## 2018-09-10 DIAGNOSIS — M7711 Lateral epicondylitis, right elbow: Secondary | ICD-10-CM

## 2018-09-10 DIAGNOSIS — M79642 Pain in left hand: Secondary | ICD-10-CM | POA: Diagnosis not present

## 2018-09-10 DIAGNOSIS — G8929 Other chronic pain: Secondary | ICD-10-CM | POA: Diagnosis not present

## 2018-09-10 DIAGNOSIS — M25521 Pain in right elbow: Secondary | ICD-10-CM

## 2018-09-10 DIAGNOSIS — Z9889 Other specified postprocedural states: Secondary | ICD-10-CM | POA: Diagnosis not present

## 2018-09-10 DIAGNOSIS — M79641 Pain in right hand: Secondary | ICD-10-CM

## 2018-09-10 DIAGNOSIS — M5412 Radiculopathy, cervical region: Secondary | ICD-10-CM | POA: Diagnosis not present

## 2018-09-10 MED ORDER — PREGABALIN 100 MG PO CAPS: 100.0000 mg | ORAL_CAPSULE | Freq: Two times a day (BID) | ORAL | 3 refills | Status: DC

## 2018-09-10 NOTE — Assessment & Plan Note (Signed)
Likely carpal tunnel in spite of negative nerve conduction/EMG. No improvement with bilateral nighttime wrist once. Increase Lyrica to 100 mg to use up to twice a day, did not tolerate gabapentin. We are avoiding hydrodissection now as he needs to heal from his pilonidal excision and cervical ACDF.

## 2018-09-10 NOTE — Assessment & Plan Note (Signed)
Failed several injections, topical nitroglycerin. Proceeding with MRI of the right elbow. Next step would be a PRP percutaneous tenotomy but I would like to delay this until he is healed from his cervical ACDF and pilonidal excision.

## 2018-09-10 NOTE — Progress Notes (Signed)
Subjective:    CC: Follow-up  HPI: Shaun Camacho returns, he has had a pretty tough past few months.  He is post cervical ACDF, overall healing well but unable to take NSAIDs.  He is also now just status post pilonidal excision.  He is in significant pain.  We have been treating him for bilateral carpal tunnel syndrome, night splinting for a month was not efficacious.  The plan is to do a median nerve hydrodissection today.  Unfortunately because he is so close to his pilonidal excision I would like to avoid steroid use.  In addition he has had a long history of right elbow pain, lateral epicondylitis.  We have done several injections, topical nitroglycerin, bracing, activity modification, physical therapy without significant improvement.  I reviewed the past medical history, family history, social history, surgical history, and allergies today and no changes were needed.  Please see the problem list section below in epic for further details.  Past Medical History: Past Medical History:  Diagnosis Date  . Anxiety and depression 08/23/2016   08/23/2016 PHQ9 = 6, GAD7 = 7 09/12/2016 PHQ9 = 6, GAD7 = 3 10/11/2016 PHQ9 = 1, GAD7 = 1 12/27/2016 PHQ9 = 3, GAD7 = 4 01/24/2017 PHQ9 = 2, GAD7 = 5  . Arthritis   . Complication of anesthesia    when wakes up gets agitated   . GERD (gastroesophageal reflux disease) 12/29/2015  . Male hypogonadism 01/12/2016  . Radiculitis of right cervical region 03/22/2016   MRI from [facility] shows moderate to severe right C6-C7 neuroforaminal stenosis with mass-effect on the exiting right C7 nerve root.   Past Surgical History: Past Surgical History:  Procedure Laterality Date  . ANTERIOR CERVICAL DECOMP/DISCECTOMY FUSION N/A 07/15/2018   Procedure: ANTERIOR CERVICAL DECOMPRESSION FUSION, CERVICAL FIVE-SIX, CERVICAL SIX-SEVEN WITH INSTRUMENTATION AND ALLOGRAFT;  Surgeon: Estill Bamberg, MD;  [facility];  Service: Orthopedics;  Laterality: N/A;  . PILONIDAL CYST  EXCISION N/A 09/03/2018   Procedure: EXCISION OF PILONIDAL DISEASE;  Surgeon: Karie Soda, MD;  Location[facility];  Service: General;  Laterality: N/A;  . SURGERY SCROTAL / TESTICULAR     Social History: Social History   Socioeconomic History  . Marital status: Married    Spouse name: Not on file  . Number of children: Not on file  . Years of education: Not on file  . Highest education level: Not on file  Occupational History  . Not on file  Social Needs  . Financial resource strain: Not on file  . Food insecurity:    Worry: Not on file    Inability: Not on file  . Transportation needs:    Medical: Not on file    Non-medical: Not on file  Tobacco Use  . Smoking status: Current Every Day Smoker    Packs/day: 0.50    Years: 18.00    Pack years: 9.00  . Smokeless tobacco: Current User    Types: Chew  Substance and Sexual Activity  . Alcohol use: Not on file    Comment: rare  . Drug use: No  . Sexual activity: Not on file  Lifestyle  . Physical activity:    Days per week: Not on file    Minutes per session: Not on file  . Stress: Not on file  Relationships  . Social connections:    Talks on phone: Not on file    Gets together: Not on file    Attends religious service: Not on file    Active member  of club or organization: Not on file    Attends meetings of clubs or organizations: Not on file    Relationship status: Not on file  Other Topics Concern  . Not on file  Social History Narrative  . Not on file   Family History: Family History  Problem Relation Age of Onset  . Thyroid disease Father   . Thyroid disease Sister   . Diabetes Maternal Grandmother    Allergies: Allergies  Allergen Reactions  . Tramadol Palpitations  . Meloxicam Other (See Comments)    Somnolence   Medications: See med rec.  Review of Systems: No fevers, chills, night sweats, weight loss, chest pain, or shortness of breath.   Objective:    General: Well Developed, well nourished,  and in no acute distress.  Neuro: Alert and oriented x3, extra-ocular muscles intact, sensation grossly intact.  HEENT: Normocephalic, atraumatic, pupils equal round reactive to light, neck supple, no masses, no lymphadenopathy, thyroid nonpalpable.  Skin: Warm and dry, no rashes. Cardiac: Regular rate and rhythm, no murmurs rubs or gallops, no lower extremity edema.  Respiratory: Clear to auscultation bilaterally. Not using accessory muscles, speaking in full sentences.  Impression and Recommendations:    Lateral epicondylitis, right elbow Failed several injections, topical nitroglycerin. Proceeding with MRI of the right elbow. Next step would be a PRP percutaneous tenotomy but I would like to delay this until he is healed from his cervical ACDF and pilonidal excision.  Bilateral hand pain Likely carpal tunnel in spite of negative nerve conduction/EMG. No improvement with bilateral nighttime wrist once. Increase Lyrica to 100 mg to use up to twice a day, did not tolerate gabapentin. We are avoiding hydrodissection now as he needs to heal from his pilonidal excision and cervical ACDF. ___________________________________________ [name]. Benjamin Stain, M.D., ABFM., CAQSM. Primary Care and Sports Medicine [place] [facility]  Adjunct Professor of Family Medicine  [employer]

## 2018-09-15 ENCOUNTER — Encounter: Payer: Self-pay | Admitting: Sports Medicine

## 2018-09-16 ENCOUNTER — Other Ambulatory Visit: Payer: Self-pay

## 2018-09-16 ENCOUNTER — Encounter: Payer: Self-pay | Admitting: Sports Medicine

## 2018-09-16 ENCOUNTER — Ambulatory Visit (INDEPENDENT_AMBULATORY_CARE_PROVIDER_SITE_OTHER): Payer: BLUE CROSS/BLUE SHIELD | Admitting: Sports Medicine

## 2018-09-16 DIAGNOSIS — M545 Low back pain, unspecified: Secondary | ICD-10-CM

## 2018-09-16 NOTE — Progress Notes (Signed)
Subjective:    CC: Paperwork  HPI: Shaun Camacho is here for me to fill out disability paperwork.  I reviewed the past medical history, family history, social history, surgical history, and allergies today and no changes were needed.  Please see the problem list section below in epic for further details.  Past Medical History: Past Medical History:  Diagnosis Date  . Anxiety and depression 08/23/2016   08/23/2016 PHQ9 = 6, GAD7 = 7 09/12/2016 PHQ9 = 6, GAD7 = 3 10/11/2016 PHQ9 = 1, GAD7 = 1 12/27/2016 PHQ9 = 3, GAD7 = 4 01/24/2017 PHQ9 = 2, GAD7 = 5  . Arthritis   . Complication of anesthesia    when wakes up gets agitated   . GERD (gastroesophageal reflux disease) 12/29/2015  . Male hypogonadism 01/12/2016  . Radiculitis of right cervical region 03/22/2016   MRI from [facility] shows moderate to severe right C6-C7 neuroforaminal stenosis with mass-effect on the exiting right C7 nerve root.   Past Surgical History: Past Surgical History:  Procedure Laterality Date  . ANTERIOR CERVICAL DECOMP/DISCECTOMY FUSION N/A 07/15/2018   Procedure: ANTERIOR CERVICAL DECOMPRESSION FUSION, CERVICAL FIVE-SIX, CERVICAL SIX-SEVEN WITH INSTRUMENTATION AND ALLOGRAFT;  Surgeon: Estill Bamberg, MD;  Location: [facility];  Service: Orthopedics;  Laterality: N/A;  . PILONIDAL CYST EXCISION N/A 09/03/2018   Procedure: EXCISION OF PILONIDAL DISEASE;  Surgeon: Karie Soda, MD;  [facility];  Service: General;  Laterality: N/A;  . SURGERY SCROTAL / TESTICULAR     Social History: Social History   Socioeconomic History  . Marital status: Married    Spouse name: Not on file  . Number of children: Not on file  . Years of education: Not on file  . Highest education level: Not on file  Occupational History  . Not on file  Social Needs  . Financial resource strain: Not on file  . Food insecurity:    Worry: Not on file    Inability: Not on file  . Transportation needs:    Medical: Not on file    Non-medical:  Not on file  Tobacco Use  . Smoking status: Current Every Day Smoker    Packs/day: 0.50    Years: 18.00    Pack years: 9.00  . Smokeless tobacco: Current User    Types: Chew  Substance and Sexual Activity  . Alcohol use: Not on file    Comment: rare  . Drug use: No  . Sexual activity: Not on file  Lifestyle  . Physical activity:    Days per week: Not on file    Minutes per session: Not on file  . Stress: Not on file  Relationships  . Social connections:    Talks on phone: Not on file    Gets together: Not on file    Attends religious service: Not on file    Active member of club or organization: Not on file    Attends meetings of clubs or organizations: Not on file    Relationship status: Not on file  Other Topics Concern  . Not on file  Social History Narrative  . Not on file   Family History: Family History  Problem Relation Age of Onset  . Thyroid disease Father   . Thyroid disease Sister   . Diabetes Maternal Grandmother    Allergies: Allergies  Allergen Reactions  . Tramadol Palpitations  . Meloxicam Other (See Comments)    Somnolence   Medications: See med rec.  Review of Systems: No fevers, chills, night  sweats, weight loss, chest pain, or shortness of breath.   Objective:    General: Well Developed, well nourished, and in no acute distress.  Neuro: Alert and oriented x3, extra-ocular muscles intact, sensation grossly intact.  HEENT: Normocephalic, atraumatic, pupils equal round reactive to light, neck supple, no masses, no lymphadenopathy, thyroid nonpalpable.  Skin: Warm and dry, no rashes. Cardiac: Regular rate and rhythm, no murmurs rubs or gallops, no lower extremity edema.  Respiratory: Clear to auscultation bilaterally. Not using accessory muscles, speaking in full sentences.  Impression and Recommendations:    Acute right-sided low back pain Disability paperwork filled out today. He does have an epidural scheduled with Dr. Regino Schultze.    ___________________________________________ [name]. Benjamin Stain, M.D., ABFM., CAQSM. Primary Care and Sports Medicine [place] [facility]  Adjunct Professor of Family Medicine  [employer]

## 2018-09-16 NOTE — Assessment & Plan Note (Signed)
Disability paperwork filled out today. He does have an epidural scheduled with Dr. Regino Schultze.

## 2018-09-17 DIAGNOSIS — Z9889 Other specified postprocedural states: Secondary | ICD-10-CM | POA: Diagnosis not present

## 2018-09-17 DIAGNOSIS — M5412 Radiculopathy, cervical region: Secondary | ICD-10-CM | POA: Diagnosis not present

## 2018-09-20 ENCOUNTER — Other Ambulatory Visit: Payer: Self-pay

## 2018-09-20 ENCOUNTER — Ambulatory Visit (INDEPENDENT_AMBULATORY_CARE_PROVIDER_SITE_OTHER): Payer: BLUE CROSS/BLUE SHIELD

## 2018-09-20 DIAGNOSIS — M7711 Lateral epicondylitis, right elbow: Secondary | ICD-10-CM

## 2018-09-20 DIAGNOSIS — S56511A Strain of other extensor muscle, fascia and tendon at forearm level, right arm, initial encounter: Secondary | ICD-10-CM | POA: Diagnosis not present

## 2018-09-21 ENCOUNTER — Encounter: Payer: Self-pay | Admitting: Sports Medicine

## 2018-09-24 ENCOUNTER — Encounter (INDEPENDENT_AMBULATORY_CARE_PROVIDER_SITE_OTHER): Payer: BLUE CROSS/BLUE SHIELD | Admitting: Sports Medicine

## 2018-09-24 DIAGNOSIS — M7711 Lateral epicondylitis, right elbow: Secondary | ICD-10-CM

## 2018-09-24 NOTE — Assessment & Plan Note (Signed)
At this point has failed several injections, topical nitroglycerin, bracing, rehabilitation exercises. The next step would be percutaneous PRP tenotomy, he would like a referral to discuss surgical opinion as well.

## 2018-09-28 DIAGNOSIS — M7711 Lateral epicondylitis, right elbow: Secondary | ICD-10-CM | POA: Diagnosis not present

## 2018-10-07 ENCOUNTER — Encounter: Payer: Self-pay | Admitting: Sports Medicine

## 2018-10-07 NOTE — Telephone Encounter (Signed)
Anyone in medical records can handle sending notes.  I don't think this needs me.

## 2018-10-08 ENCOUNTER — Encounter: Payer: Self-pay | Admitting: Sports Medicine

## 2018-10-08 ENCOUNTER — Other Ambulatory Visit: Payer: Self-pay

## 2018-10-08 ENCOUNTER — Ambulatory Visit (INDEPENDENT_AMBULATORY_CARE_PROVIDER_SITE_OTHER): Payer: BLUE CROSS/BLUE SHIELD | Admitting: Sports Medicine

## 2018-10-08 DIAGNOSIS — Z0271 Encounter for disability determination: Secondary | ICD-10-CM | POA: Diagnosis not present

## 2018-10-08 NOTE — Assessment & Plan Note (Signed)
Long-term disability paperwork filled out today.

## 2018-10-08 NOTE — Telephone Encounter (Signed)
Amber please take care of this, he keeps messaging me for clerical stuff.

## 2018-10-08 NOTE — Progress Notes (Signed)
   Long term disability paperwork filled out today.  I spent 25 minutes with this patient, greater than 50% was face-to-face time counseling regarding the above diagnoses   ___________________________________________ [name]. Benjamin Stain, M.D., ABFM., CAQSM. Primary Care and Sports Medicine [place] [facility]  Adjunct Professor of Family Medicine  [employer]

## 2018-10-13 DIAGNOSIS — Z9889 Other specified postprocedural states: Secondary | ICD-10-CM | POA: Diagnosis not present

## 2018-10-13 DIAGNOSIS — M5412 Radiculopathy, cervical region: Secondary | ICD-10-CM | POA: Diagnosis not present

## 2018-10-20 DIAGNOSIS — M5412 Radiculopathy, cervical region: Secondary | ICD-10-CM | POA: Diagnosis not present

## 2018-10-20 DIAGNOSIS — Z9889 Other specified postprocedural states: Secondary | ICD-10-CM | POA: Diagnosis not present

## 2018-10-27 DIAGNOSIS — Z9889 Other specified postprocedural states: Secondary | ICD-10-CM | POA: Diagnosis not present

## 2018-10-27 DIAGNOSIS — M5412 Radiculopathy, cervical region: Secondary | ICD-10-CM | POA: Diagnosis not present

## 2018-11-04 ENCOUNTER — Encounter: Payer: Self-pay | Admitting: Sports Medicine

## 2018-11-04 ENCOUNTER — Ambulatory Visit (INDEPENDENT_AMBULATORY_CARE_PROVIDER_SITE_OTHER): Payer: BLUE CROSS/BLUE SHIELD | Admitting: Sports Medicine

## 2018-11-04 DIAGNOSIS — Z0271 Encounter for disability determination: Secondary | ICD-10-CM

## 2018-11-04 NOTE — Assessment & Plan Note (Signed)
Long-term disability paperwork filled out.

## 2018-11-04 NOTE — Progress Notes (Signed)
   Disability paperwork filled out today.  I spent 25 minutes with this patient, greater than 50% was face-to-face time counseling regarding the above diagnoses.

## 2018-11-24 DIAGNOSIS — S53431A Radial collateral ligament sprain of right elbow, initial encounter: Secondary | ICD-10-CM | POA: Diagnosis not present

## 2018-11-24 DIAGNOSIS — G8918 Other acute postprocedural pain: Secondary | ICD-10-CM | POA: Diagnosis not present

## 2018-11-24 DIAGNOSIS — M7711 Lateral epicondylitis, right elbow: Secondary | ICD-10-CM | POA: Diagnosis not present

## 2018-12-07 DIAGNOSIS — Z9889 Other specified postprocedural states: Secondary | ICD-10-CM | POA: Diagnosis not present

## 2018-12-21 ENCOUNTER — Ambulatory Visit (INDEPENDENT_AMBULATORY_CARE_PROVIDER_SITE_OTHER): Payer: BC Managed Care – PPO | Admitting: Sports Medicine

## 2018-12-21 ENCOUNTER — Encounter: Payer: Self-pay | Admitting: Sports Medicine

## 2018-12-21 DIAGNOSIS — M7702 Medial epicondylitis, left elbow: Secondary | ICD-10-CM

## 2018-12-21 DIAGNOSIS — M7711 Lateral epicondylitis, right elbow: Secondary | ICD-10-CM

## 2018-12-21 MED ORDER — NAPROXEN 500 MG PO TABS: 500.0000 mg | ORAL_TABLET | Freq: Two times a day (BID) | ORAL | 3 refills | Status: DC | PRN

## 2018-12-21 NOTE — Progress Notes (Signed)
Subjective:    CC: Left elbow pain  HPI: This is a pleasant 33 year old male, for the past several weeks has had increasing pa85in on the medial aspect of his left elbow, worse with gripping, moderate, persistent, localized without radiation, he did recently have lateral extensor tendon origin debridement, and has been using his left hand more often.  I reviewed the past medical history, family history, social history, surgical history, and allergies today and no changes were needed.  Please see the problem list section below in epic for further details.  Past Medical History: Past Medical History:  Diagnosis Date  . Anxiety and depression 08/23/2016   08/23/2016 PHQ9 = 6, GAD7 = 7 09/12/2016 PHQ9 = 6, GAD7 = 3 10/11/2016 PHQ9 = 1, GAD7 = 1 12/27/2016 PHQ9 = 3, GAD7 = 4 01/24/2017 PHQ9 = 2, GAD7 = 5  . Arthritis   . Complication of anesthesia    when wakes up gets agitated   . GERD (gastroesophageal reflux disease) 12/29/2015  . Male hypogonadism 01/12/2016  . Radiculitis of right cervical region 03/22/2016   [facility] shows moderate to severe right C6-C7 neuroforaminal stenosis with mass-effect on the exiting right C7 nerve root.   Past Surgical History: Past Surgical History:  Procedure Laterality Date  . ANTERIOR CERVICAL DECOMP/DISCECTOMY FUSION N/A 07/15/2018   Procedure: ANTERIOR CERVICAL DECOMPRESSION FUSION, CERVICAL FIVE-SIX, CERVICAL SIX-SEVEN WITH INSTRUMENTATION AND ALLOGRAFT;  Surgeon: Estill Bambergumonski, Mark, MD;  Location: [facility];  Service: Orthopedics;  Laterality: N/A;  . PILONIDAL CYST EXCISION N/A 09/03/2018   Procedure: EXCISION OF PILONIDAL DISEASE;  Surgeon: Karie SodaGross, Steven, MD;  Location: [facility];  Service: General;  Laterality: N/A;  . SURGERY SCROTAL / TESTICULAR     Social History: Social History   Socioeconomic History  . Marital status: Married    Spouse name: Not on file  . Number of children: Not on file  . Years of education: Not on file  . Highest  education level: Not on file  Occupational History  . Not on file  Social Needs  . Financial resource strain: Not on file  . Food insecurity    Worry: Not on file    Inability: Not on file  . Transportation needs    Medical: Not on file    Non-medical: Not on file  Tobacco Use  . Smoking status: Current Every Day Smoker    Packs/day: 0.50    Years: 18.00    Pack years: 9.00  . Smokeless tobacco: Current User    Types: Chew  Substance and Sexual Activity  . Alcohol use: Not on file    Comment: rare  . Drug use: No  . Sexual activity: Not on file  Lifestyle  . Physical activity    Days per week: Not on file    Minutes per session: Not on file  . Stress: Not on file  Relationships  . Social Musicianconnections    Talks on phone: Not on file    Gets together: Not on file    Attends religious service: Not on file    Active member of club or organization: Not on file    Attends meetings of clubs or organizations: Not on file    Relationship status: Not on file  Other Topics Concern  . Not on file  Social History Narrative  . Not on file   Family History: Family History  Problem Relation Age of Onset  . Thyroid disease Father   . Thyroid disease Sister   .  Diabetes Maternal Grandmother    Allergies: Allergies  Allergen Reactions  . Tramadol Palpitations  . Meloxicam Other (See Comments)    Somnolence   Medications: See med rec.  Review of Systems: No fevers, chills, night sweats, weight loss, chest pain, or shortness of breath.   Objective:    General: Well Developed, well nourished, and in no acute distress.  Neuro: Alert and oriented x3, extra-ocular muscles intact, sensation grossly intact.  HEENT: Normocephalic, atraumatic, pupils equal round reactive to light, neck supple, no masses, no lymphadenopathy, thyroid nonpalpable.  Skin: Warm and dry, no rashes. Cardiac: Regular rate and rhythm, no murmurs rubs or gallops, no lower extremity edema.  Respiratory: Clear  to auscultation bilaterally. Not using accessory muscles, speaking in full sentences. Left elbow: Unremarkable to inspection. Range of motion full pronation, supination, flexion, extension. Strength is full to all of the above directions Stable to varus, valgus stress. Negative moving valgus stress test. Tender to palpation of the common flexor tendon origin Ulnar nerve does not sublux. Negative cubital tunnel Tinel's.  Impression and Recommendations:    Medial epicondylitis, left Darting conservative, rehab exercises, naproxen. He is applying for full Social Security disability for his neck, pilonidal disease, ankle arthritis, carpal tunnel syndrome, lateral epicondylitis. I would happily support this.  Lateral epicondylitis, right elbow Recent operative debridement with Dr. Tamera Punt.    ___________________________________________ Gwen Her. Dianah Field, M.D., ABFM., CAQSM. Primary Care and Sports Medicine [place] [facility]  Adjunct Professor of [employer] [employer]

## 2018-12-21 NOTE — Assessment & Plan Note (Signed)
Recent operative debridement with Dr. Tamera Punt.

## 2018-12-21 NOTE — Assessment & Plan Note (Signed)
Darting conservative, rehab exercises, naproxen. He is applying for full Social Security disability for his neck, pilonidal disease, ankle arthritis, carpal tunnel syndrome, lateral epicondylitis. I would happily support this.

## 2018-12-31 ENCOUNTER — Encounter: Payer: Self-pay | Admitting: Sports Medicine

## 2018-12-31 ENCOUNTER — Ambulatory Visit (INDEPENDENT_AMBULATORY_CARE_PROVIDER_SITE_OTHER): Payer: BC Managed Care – PPO | Admitting: Sports Medicine

## 2018-12-31 VITALS — BP 142/76 | HR 79 | Ht 70.0 in | Wt 212.0 lb

## 2018-12-31 DIAGNOSIS — Z0000 Encounter for general adult medical examination without abnormal findings: Secondary | ICD-10-CM | POA: Diagnosis not present

## 2018-12-31 DIAGNOSIS — M7711 Lateral epicondylitis, right elbow: Secondary | ICD-10-CM | POA: Diagnosis not present

## 2018-12-31 DIAGNOSIS — Z981 Arthrodesis status: Secondary | ICD-10-CM

## 2018-12-31 DIAGNOSIS — E559 Vitamin D deficiency, unspecified: Secondary | ICD-10-CM | POA: Diagnosis not present

## 2018-12-31 DIAGNOSIS — M5416 Radiculopathy, lumbar region: Secondary | ICD-10-CM

## 2018-12-31 DIAGNOSIS — M7702 Medial epicondylitis, left elbow: Secondary | ICD-10-CM

## 2018-12-31 DIAGNOSIS — G5603 Carpal tunnel syndrome, bilateral upper limbs: Secondary | ICD-10-CM

## 2018-12-31 DIAGNOSIS — L988 Other specified disorders of the skin and subcutaneous tissue: Secondary | ICD-10-CM

## 2018-12-31 DIAGNOSIS — Z0271 Encounter for disability determination: Secondary | ICD-10-CM

## 2018-12-31 NOTE — Assessment & Plan Note (Signed)
Annual physical exam as above. Checking routine labs. Return in 1 year for this.

## 2018-12-31 NOTE — Progress Notes (Addendum)
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  He is unable to perform the duties of his job.  He has had a C5-C7 cervical ACDF, has persistent pain, weakness, and loss of range of motion of the neck.  He is status post right lateral epicondylitis debridement, with persistent pain, weakness, loss of function.  He has left medial epicondylitis, currently undergoing conservative treatment.  Significant pain, weakness, loss of function is present.  He has bilateral carpal tunnel syndrome, failed nighttime splinting, with pain, paresthesias, numbness, and loss of function.  He is status post pilonidal disease with surgical excision and primary closure, continuing to heal, this results in loss of range of motion of the torso, with significant pain.  He has lumbar radiculopathy, necessitating frequent lumbar epidurals, resulting in pain and loss of function.  I reviewed the past medical history, family history, social history, surgical history, and allergies today and no changes were needed.  Please see the problem list section below in epic for further details.  Past Medical History: Past Medical History:  Diagnosis Date  . Anxiety and depression 08/23/2016   08/23/2016 PHQ9 = 6, GAD7 = 7 09/12/2016 PHQ9 = 6, GAD7 = 3 10/11/2016 PHQ9 = 1, GAD7 = 1 12/27/2016 PHQ9 = 3, GAD7 = 4 01/24/2017 PHQ9 = 2, GAD7 = 5  . Arthritis   . Complication of anesthesia    when wakes up gets agitated   . GERD (gastroesophageal reflux disease) 12/29/2015  . Male hypogonadism 01/12/2016  . Radiculitis of right cervical region 03/22/2016   MRI from [facility] shows moderate to severe right C6-C7 neuroforaminal stenosis with mass-effect on the exiting right C7 nerve root.   Past Surgical History: Past Surgical History:  Procedure  Laterality Date  . ANTERIOR CERVICAL DECOMP/DISCECTOMY FUSION N/A 07/15/2018   Procedure: ANTERIOR CERVICAL DECOMPRESSION FUSION, CERVICAL FIVE-SIX, CERVICAL SIX-SEVEN WITH INSTRUMENTATION AND ALLOGRAFT;  Surgeon: Phylliss Bob, MD;  Location: [place];  Service: Orthopedics;  Laterality: N/A;  . LATERAL EPICONDYLE RELEASE Right   . PILONIDAL CYST EXCISION N/A 09/03/2018   Procedure: EXCISION OF PILONIDAL DISEASE;  Surgeon: Michael Boston, MD;  Location: [facility];  Service: General;  Laterality: N/A;  . SURGERY SCROTAL / TESTICULAR     Social History: Social History   Socioeconomic History  . Marital status: Married    Spouse name: Not on file  . Number of children: Not on file  . Years of education: Not on file  . Highest education level: Not on file  Occupational History  . Not on file  Social Needs  . Financial resource strain: Not on file  . Food insecurity    Worry: Not on file    Inability: Not on file  . Transportation needs    Medical: Not on file    Non-medical: Not on file  Tobacco Use  . Smoking status: Current Every Day Smoker    Packs/day: 0.50    Years: 18.00    Pack years: 9.00  . Smokeless tobacco: Current User    Types: Chew  Substance and Sexual Activity  . Alcohol use: Not on file    Comment: rare  . Drug use: No  . Sexual activity: Not on file  Lifestyle  . Physical activity    Days per week: Not on file    Minutes per session:  Not on file  . Stress: Not on file  Relationships  . Social Musicianconnections    Talks on phone: Not on file    Gets together: Not on file    Attends religious service: Not on file    Active member of club or organization: Not on file    Attends meetings of clubs or organizations: Not on file    Relationship status: Not on file  Other Topics Concern  . Not on file  Social History Narrative  . Not on file   Family History: Family History  Problem Relation Age of Onset  . Thyroid disease Father   . Thyroid disease Sister   .  Diabetes Maternal Grandmother    Allergies: Allergies  Allergen Reactions  . Tramadol Palpitations  . Meloxicam Other (See Comments)    Somnolence   Medications: See med rec.  Review of Systems: No headache, visual changes, nausea, vomiting, diarrhea, constipation, dizziness, abdominal pain, skin rash, fevers, chills, night sweats, swollen lymph nodes, weight loss, chest pain, body aches, joint swelling, muscle aches, shortness of breath, mood changes, visual or auditory hallucinations.  Objective:    General: Well Developed, well nourished, and in no acute distress.  Neuro: Alert and oriented x3, extra-ocular muscles intact, sensation grossly intact.  HEENT: Normocephalic, atraumatic, pupils equal round reactive to light, neck supple, no masses, no lymphadenopathy, thyroid nonpalpable.  Skin: Warm and dry, no rashes noted.  Cardiac: Regular rate and rhythm, no murmurs rubs or gallops.  Respiratory: Clear to auscultation bilaterally. Not using accessory muscles, speaking in full sentences.  Abdominal: Soft, nontender, nondistended, positive bowel sounds, no masses, no organomegaly.  Neck: Negative spurling's Well-healed surgical scar. Good left to right rotation, 5 degree lag in flexion and extension. Grip strength and sensation normal in bilateral hands Strength good C4 to T1 distribution No sensory change to C4 to T1 Reflexes normal Right elbow: Visible surgical scar, tender to palpation. Range of motion full pronation, supination, flexion, extension. Weak to wrist and finger extension Stable to varus, valgus stress. Negative moving valgus stress test. No discrete areas of tenderness to palpation. Ulnar nerve does not sublux. Negative cubital tunnel Tinel's.  Impression and Recommendations:    The patient was counselled, risk factors were discussed, anticipatory guidance given.  Annual physical exam Annual physical exam as above. Checking routine labs. Return in 1 year  for this.  Lateral epicondylitis, right elbow Status post operative debridement with Dr. Ave Filterhandler. Has had multiple injections. Further details of this per surgeon.  History of fusion of cervical spine Was doing well after cervical C5-C7 ACDF, further details per Dr. Estill BambergMark Dumonski.  Medial epicondylitis, left Pain at the left medial epicondyle, currently undergoing conservative treatment. Has weakness, and loss of function.  Lumbar radiculopathy, right Lumbar radiculopathy, pain, loss of function, needing multiple lumbar epidurals.  Carpal tunnel syndrome Carpal tunnel syndrome, bilateral hand pain, failed nighttime splinting bilaterally. Currently on Lyrica 100 mg twice a day, gabapentin was intolerable. Holding off on hydrodissection, he is still recovering from pilonidal excision and cervical ACDF as well as lateral epicondylitis debridement.  Pilonidal disease s/p excision 09/03/2018 Status post full excision with primary closure, still healing. This causes pain and loss of function.  Encounter for disability determination Mr. Shaun Camacho is currently applying for full disability. I fully support this, he is disabled from his current job, and any job requiring more than sedentary work.   ___________________________________________ Ihor Austinhomas J. Benjamin Stainhekkekandam, M.D., ABFM., CAQSM. Primary Care and Sports Medicine [facility]  [facility]  Adjunct Professor of [place] [employer]

## 2018-12-31 NOTE — Assessment & Plan Note (Signed)
Pain at the left medial epicondyle, currently undergoing conservative treatment. Has weakness, and loss of function.

## 2018-12-31 NOTE — Assessment & Plan Note (Signed)
Status post operative debridement with Dr. Tamera Punt. Has had multiple injections. Further details of this per surgeon.

## 2018-12-31 NOTE — Assessment & Plan Note (Signed)
Mr. Buckles is currently applying for full disability. I fully support this, he is disabled from his current job, and any job requiring more than sedentary work.

## 2018-12-31 NOTE — Assessment & Plan Note (Signed)
Lumbar radiculopathy, pain, loss of function, needing multiple lumbar epidurals.

## 2018-12-31 NOTE — Assessment & Plan Note (Signed)
Status post full excision with primary closure, still healing. This causes pain and loss of function.

## 2018-12-31 NOTE — Assessment & Plan Note (Signed)
Carpal tunnel syndrome, bilateral hand pain, failed nighttime splinting bilaterally. Currently on Lyrica 100 mg twice a day, gabapentin was intolerable. Holding off on hydrodissection, he is still recovering from pilonidal excision and cervical ACDF as well as lateral epicondylitis debridement.

## 2018-12-31 NOTE — Assessment & Plan Note (Signed)
Was doing well after cervical C5-C7 ACDF, further details per Dr. Phylliss Bob.

## 2019-01-01 DIAGNOSIS — M542 Cervicalgia: Secondary | ICD-10-CM | POA: Diagnosis not present

## 2019-01-08 ENCOUNTER — Encounter: Payer: Self-pay | Admitting: Sports Medicine

## 2019-01-08 ENCOUNTER — Other Ambulatory Visit: Payer: Self-pay | Admitting: Sports Medicine

## 2019-01-08 DIAGNOSIS — E291 Testicular hypofunction: Secondary | ICD-10-CM

## 2019-01-11 MED ORDER — TESTOSTERONE CYPIONATE 200 MG/ML IM SOLN: INTRAMUSCULAR | 0 refills | Status: DC

## 2019-01-18 ENCOUNTER — Ambulatory Visit (INDEPENDENT_AMBULATORY_CARE_PROVIDER_SITE_OTHER): Payer: BC Managed Care – PPO | Admitting: Sports Medicine

## 2019-01-18 ENCOUNTER — Encounter: Payer: Self-pay | Admitting: Sports Medicine

## 2019-01-18 ENCOUNTER — Ambulatory Visit (INDEPENDENT_AMBULATORY_CARE_PROVIDER_SITE_OTHER): Payer: BC Managed Care – PPO

## 2019-01-18 ENCOUNTER — Other Ambulatory Visit: Payer: Self-pay

## 2019-01-18 DIAGNOSIS — Z981 Arthrodesis status: Secondary | ICD-10-CM

## 2019-01-18 DIAGNOSIS — M25474 Effusion, right foot: Secondary | ICD-10-CM | POA: Diagnosis not present

## 2019-01-18 DIAGNOSIS — M79671 Pain in right foot: Secondary | ICD-10-CM | POA: Diagnosis not present

## 2019-01-18 DIAGNOSIS — M542 Cervicalgia: Secondary | ICD-10-CM | POA: Diagnosis not present

## 2019-01-18 MED ORDER — PREDNISONE 50 MG PO TABS: ORAL_TABLET | ORAL | 0 refills | Status: DC

## 2019-01-18 NOTE — Assessment & Plan Note (Signed)
Right first MTP pain, history of a toe fracture in the distant past. X-rays, prednisone we are using for his neck should help the toe. If insufficient improvement and normal x-rays we do need to consider right L4 radiculitis as a possible cause, he did have a right L4-L5 epidural in January of this year.

## 2019-01-18 NOTE — Progress Notes (Signed)
Subjective:    CC: Upper arm pain, foot pain  HPI: This is a pleasant 33 year old male, he is post C5-C7 ACDF, doing well, more recently he is had pain that he localizes radiating from both upper shoulders into both biceps, with a sensation of spasm.  No trauma, no constitutional symptoms, no progressive weakness.  In addition he has had pain in his right great toe, he recalls having a great toe fracture in the distant past and has had pain intermittently ever since.  He does have a history of lumbar DDD post right L4-L5 interlaminar epidural with good relief of back and radicular symptoms.  I reviewed the past medical history, family history, social history, surgical history, and allergies today and no changes were needed.  Please see the problem list section below in epic for further details.  Past Medical History: Past Medical History:  Diagnosis Date  . Anxiety and depression 08/23/2016   08/23/2016 PHQ9 = 6, GAD7 = 7 09/12/2016 PHQ9 = 6, GAD7 = 3 10/11/2016 PHQ9 = 1, GAD7 = 1 12/27/2016 PHQ9 = 3, GAD7 = 4 01/24/2017 PHQ9 = 2, GAD7 = 5  . Arthritis   . Complication of anesthesia    when wakes up gets agitated   . GERD (gastroesophageal reflux disease) 12/29/2015  . Male hypogonadism 01/12/2016  . Radiculitis of right cervical region 03/22/2016   MRI [facility] shows moderate to severe right C6-C7 neuroforaminal stenosis with mass-effect on the exiting right C7 nerve root.   Past Surgical History: Past Surgical History:  Procedure Laterality Date  . ANTERIOR CERVICAL DECOMP/DISCECTOMY FUSION N/A 07/15/2018   Procedure: ANTERIOR CERVICAL DECOMPRESSION FUSION, CERVICAL FIVE-SIX, CERVICAL SIX-SEVEN WITH INSTRUMENTATION AND ALLOGRAFT;  Surgeon: Phylliss Bob, MD;  Location: [place];  Service: Orthopedics;  Laterality: N/A;  . LATERAL EPICONDYLE RELEASE Right   . PILONIDAL CYST EXCISION N/A 09/03/2018   Procedure: EXCISION OF PILONIDAL DISEASE;  Surgeon: Michael Boston, MD;  Location: [facility];  Service: General;  Laterality: N/A;  . SURGERY SCROTAL / TESTICULAR     Social History: Social History   Socioeconomic History  . Marital status: Married    Spouse name: Not on file  . Number of children: Not on file  . Years of education: Not on file  . Highest education level: Not on file  Occupational History  . Not on file  Social Needs  . Financial resource strain: Not on file  . Food insecurity    Worry: Not on file    Inability: Not on file  . Transportation needs    Medical: Not on file    Non-medical: Not on file  Tobacco Use  . Smoking status: Current Every Day Smoker    Packs/day: 0.50    Years: 18.00    Pack years: 9.00  . Smokeless tobacco: Current User    Types: Chew  Substance and Sexual Activity  . Alcohol use: Not on file    Comment: rare  . Drug use: No  . Sexual activity: Not on file  Lifestyle  . Physical activity    Days per week: Not on file    Minutes per session: Not on file  . Stress: Not on file  Relationships  . Social Herbalist on phone: Not on file    Gets together: Not on file    Attends religious service: Not on file    Active member of club or organization: Not on file    Attends meetings of  clubs or organizations: Not on file    Relationship status: Not on file  Other Topics Concern  . Not on file  Social History Narrative  . Not on file   Family History: Family History  Problem Relation Age of Onset  . Thyroid disease Father   . Thyroid disease Sister   . Diabetes Maternal Grandmother    Allergies: Allergies  Allergen Reactions  . Tramadol Palpitations  . Meloxicam Other (See Comments)    Somnolence   Medications: See med rec.  Review of Systems: No fevers, chills, night sweats, weight loss, chest pain, or shortness of breath.   Objective:    General: Well Developed, well nourished, and in no acute distress.  Neuro: Alert and oriented x3, extra-ocular muscles intact, sensation grossly intact.   HEENT: Normocephalic, atraumatic, pupils equal round reactive to light, neck supple, no masses, no lymphadenopathy, thyroid nonpalpable.  Skin: Warm and dry, no rashes. Cardiac: Regular rate and rhythm, no murmurs rubs or gallops, no lower extremity edema.  Respiratory: Clear to auscultation bilaterally. Not using accessory muscles, speaking in full sentences. Right foot: Maybe trace fullness at the first MTP, pain with terminal dorsiflexion and plantar flexion. Range of motion is full in all directions. Strength is 5/5 in all directions. No hallux valgus. No pes cavus or pes planus. No abnormal callus noted. No pain over the navicular prominence, or base of fifth metatarsal. No tenderness to palpation of the calcaneal insertion of plantar fascia. No pain at the Achilles insertion. No pain over the calcaneal bursa. No pain of the retrocalcaneal bursa. No tenderness to palpation over the tarsals, metatarsals, or phalanges. No hallux rigidus or limitus. No tenderness palpation over interphalangeal joints. No pain with compression of the metatarsal heads. Neurovascularly intact distally.  Impression and Recommendations:    History of fusion of cervical spine Was doing well after C5-C7 ACDF, treatment per Dr. Estill BambergMark Dumonski. Unfortunately he has been having some achiness and pain into both shoulders and biceps, I do suspect there may be some degree of adjacent level disease. We are going to go ahead and proceed with an MRI, as well as 5 days of prednisone. He has already had x-rays at the orthopedic office.  Swelling of first metatarsophalangeal (MTP) joint of right foot Right first MTP pain, history of a toe fracture in the distant past. X-rays, prednisone we are using for his neck should help the toe. If insufficient improvement and normal x-rays we do need to consider right L4 radiculitis as a possible cause, he did have a right L4-L5 epidural in January of this year.    ___________________________________________ Ihor Austinhomas J. Benjamin Stainhekkekandam, M.D., ABFM., CAQSM. Primary Care and Sports Medicine [facility]  Adjunct Professor of Family Medicine  [employer]

## 2019-01-18 NOTE — Assessment & Plan Note (Signed)
Was doing well after C5-C7 ACDF, treatment per Dr. Phylliss Bob. Unfortunately he has been having some achiness and pain into both shoulders and biceps, I do suspect there may be some degree of adjacent level disease. We are going to go ahead and proceed with an MRI, as well as 5 days of prednisone. He has already had x-rays at the orthopedic office.

## 2019-01-19 DIAGNOSIS — M47816 Spondylosis without myelopathy or radiculopathy, lumbar region: Secondary | ICD-10-CM | POA: Diagnosis not present

## 2019-01-20 ENCOUNTER — Encounter: Payer: Self-pay | Admitting: Sports Medicine

## 2019-01-21 MED ORDER — CYCLOBENZAPRINE HCL 10 MG PO TABS: ORAL_TABLET | ORAL | 0 refills | Status: DC

## 2019-01-22 ENCOUNTER — Telehealth: Payer: Self-pay | Admitting: Sports Medicine

## 2019-01-22 DIAGNOSIS — R29898 Other symptoms and signs involving the musculoskeletal system: Secondary | ICD-10-CM | POA: Diagnosis not present

## 2019-01-22 DIAGNOSIS — M25521 Pain in right elbow: Secondary | ICD-10-CM | POA: Diagnosis not present

## 2019-01-22 DIAGNOSIS — M25641 Stiffness of right hand, not elsewhere classified: Secondary | ICD-10-CM | POA: Diagnosis not present

## 2019-01-22 DIAGNOSIS — E291 Testicular hypofunction: Secondary | ICD-10-CM

## 2019-01-22 NOTE — Telephone Encounter (Signed)
Shaun Camacho was informed by insurance company that he needs early morning bloodwork for testosterone levels 1 more time for a total of 2 times. He will be out of town all next week. His number is [phone].

## 2019-01-22 NOTE — Telephone Encounter (Signed)
Patient advised.

## 2019-01-22 NOTE — Addendum Note (Signed)
Addended by: Silverio Decamp on: 01/22/2019 10:53 AM   Modules accepted: Orders

## 2019-01-22 NOTE — Telephone Encounter (Signed)
Orders placed, he can do this whenever

## 2019-01-25 ENCOUNTER — Encounter: Payer: Self-pay | Admitting: Sports Medicine

## 2019-01-25 DIAGNOSIS — E291 Testicular hypofunction: Secondary | ICD-10-CM | POA: Diagnosis not present

## 2019-01-29 ENCOUNTER — Encounter: Payer: Self-pay | Admitting: Sports Medicine

## 2019-01-29 DIAGNOSIS — M25521 Pain in right elbow: Secondary | ICD-10-CM | POA: Diagnosis not present

## 2019-01-29 DIAGNOSIS — R29898 Other symptoms and signs involving the musculoskeletal system: Secondary | ICD-10-CM | POA: Diagnosis not present

## 2019-01-29 DIAGNOSIS — M25531 Pain in right wrist: Secondary | ICD-10-CM | POA: Diagnosis not present

## 2019-01-29 LAB — PSA, TOTAL AND FREE
PSA, % Free: 43 % (calc) (ref >25)
PSA, Free: 0.3 ng/mL
PSA, Total: 0.7 ng/mL (ref <=4.0)

## 2019-01-29 LAB — CBC
HCT: 47.2 % (ref 38.5–50.0)
Hemoglobin: 16 g/dL (ref 13.2–17.1)
MCH: 31.1 pg (ref 27.0–33.0)
MCHC: 33.9 g/dL (ref 32.0–36.0)
MCV: 91.7 fL (ref 80.0–100.0)
MPV: 9.6 fL (ref 7.5–12.5)
Platelets: 274 10*3/uL (ref 140–400)
RBC: 5.15 10*6/uL (ref 4.20–5.80)
RDW: 13.5 % (ref 11.0–15.0)
WBC: 10 10*3/uL (ref 3.8–10.8)

## 2019-01-29 LAB — TESTOSTERONE, FREE & TOTAL
Free Testosterone: 97.5 pg/mL (ref 35.0–155.0)
Testosterone, Total, LC-MS-MS: 347 ng/dL (ref 250–1100)

## 2019-01-31 DIAGNOSIS — Z79899 Other long term (current) drug therapy: Secondary | ICD-10-CM | POA: Diagnosis not present

## 2019-01-31 DIAGNOSIS — Z885 Allergy status to narcotic agent status: Secondary | ICD-10-CM | POA: Diagnosis not present

## 2019-01-31 DIAGNOSIS — F17210 Nicotine dependence, cigarettes, uncomplicated: Secondary | ICD-10-CM | POA: Diagnosis not present

## 2019-01-31 DIAGNOSIS — Z043 Encounter for examination and observation following other accident: Secondary | ICD-10-CM | POA: Diagnosis not present

## 2019-01-31 DIAGNOSIS — G8911 Acute pain due to trauma: Secondary | ICD-10-CM | POA: Diagnosis not present

## 2019-01-31 DIAGNOSIS — Z888 Allergy status to other drugs, medicaments and biological substances status: Secondary | ICD-10-CM | POA: Diagnosis not present

## 2019-01-31 DIAGNOSIS — S61412A Laceration without foreign body of left hand, initial encounter: Secondary | ICD-10-CM | POA: Diagnosis not present

## 2019-01-31 DIAGNOSIS — W25XXXA Contact with sharp glass, initial encounter: Secondary | ICD-10-CM | POA: Diagnosis not present

## 2019-02-01 NOTE — Telephone Encounter (Signed)
Patient is aware that we are trying to appeal the medication and he wants the insurance to know ASAP. I advised him that I would touch base with insurance at a later date.

## 2019-02-02 ENCOUNTER — Telehealth: Payer: Self-pay

## 2019-02-02 NOTE — Telephone Encounter (Signed)
I called patient in response to his MyChart patient advice request. He was very upset that we would not fax the recent testosterone level ASAP.  I am not sure what happened between him and Barnet Pall.  I did offer him a GoodRx coupon for $12.89 a month at [facility]. He said no because we should just send the testosterone levels to [employer] so they will pay for the Testosterone.   I did try to calm him down so I can explain to him that the reason the testosterone was not approved was because we do not have 2 low morning, before treatment, testosterone levels. Before 2018 1 low morning testosterone levels was enough to get an approval. This is why he has been getting the testosterone approved. However, his policy in July 2426 changed. His new policy doesn't allow for the old policy on his old plan. I did ask for a compassionate review by [employer] since this is just a continuation of treatment, this could take up to 7 days.     Discount Drug Card  Your prescriptiontestosterone cypionate 44ml of 200mg /ml 2 vials  Discounted price with this card $12.89 at [facility]  This is your estimated price. The pharmacy will provide exact pricing.  Pharmacist info  Member [id number]  [phone]  [id number]  [id number]  Customer questions call: [phone]  Pharmacist questions call: [phone]

## 2019-02-02 NOTE — Telephone Encounter (Signed)
Received a fax from [facility] and the compassionate review was approved from 01/18/2019 to 07/07/2038. I did call the pharmacy and the testosterone went through for $5 a month. Patient advised.

## 2019-02-02 NOTE — Telephone Encounter (Signed)
Gosh, $12 does seem like a really good deal.  I think he should take that deal.

## 2019-02-05 DIAGNOSIS — Z9889 Other specified postprocedural states: Secondary | ICD-10-CM | POA: Diagnosis not present

## 2019-02-05 DIAGNOSIS — M5412 Radiculopathy, cervical region: Secondary | ICD-10-CM | POA: Diagnosis not present

## 2019-02-05 MED ORDER — "BD HYPODERMIC NEEDLE 18G X 1"" MISC": 1 refills | Status: DC

## 2019-02-05 MED ORDER — "BD LUER-LOK SYRINGE 22G X 1-1/2"" 3 ML MISC": 1 refills | Status: DC

## 2019-02-09 DIAGNOSIS — Z716 Tobacco abuse counseling: Secondary | ICD-10-CM | POA: Diagnosis not present

## 2019-02-09 DIAGNOSIS — F17210 Nicotine dependence, cigarettes, uncomplicated: Secondary | ICD-10-CM | POA: Diagnosis not present

## 2019-02-09 DIAGNOSIS — M545 Low back pain: Secondary | ICD-10-CM | POA: Diagnosis not present

## 2019-02-15 ENCOUNTER — Ambulatory Visit: Payer: BC Managed Care – PPO | Admitting: Sports Medicine

## 2019-02-17 ENCOUNTER — Encounter: Payer: Self-pay | Admitting: Sports Medicine

## 2019-02-17 ENCOUNTER — Ambulatory Visit: Payer: BC Managed Care – PPO | Admitting: Sports Medicine

## 2019-02-17 ENCOUNTER — Other Ambulatory Visit: Payer: Self-pay

## 2019-02-17 DIAGNOSIS — F419 Anxiety disorder, unspecified: Secondary | ICD-10-CM

## 2019-02-17 DIAGNOSIS — M2550 Pain in unspecified joint: Secondary | ICD-10-CM

## 2019-02-17 DIAGNOSIS — M25474 Effusion, right foot: Secondary | ICD-10-CM

## 2019-02-17 DIAGNOSIS — F32A Depression, unspecified: Secondary | ICD-10-CM

## 2019-02-17 DIAGNOSIS — F329 Major depressive disorder, single episode, unspecified: Secondary | ICD-10-CM

## 2019-02-17 DIAGNOSIS — S6992XD Unspecified injury of left wrist, hand and finger(s), subsequent encounter: Secondary | ICD-10-CM | POA: Diagnosis not present

## 2019-02-17 DIAGNOSIS — M5135 Other intervertebral disc degeneration, thoracolumbar region: Secondary | ICD-10-CM

## 2019-02-17 DIAGNOSIS — M5412 Radiculopathy, cervical region: Secondary | ICD-10-CM | POA: Diagnosis not present

## 2019-02-17 DIAGNOSIS — S6992XA Unspecified injury of left wrist, hand and finger(s), initial encounter: Secondary | ICD-10-CM | POA: Insufficient documentation

## 2019-02-17 DIAGNOSIS — M5134 Other intervertebral disc degeneration, thoracic region: Secondary | ICD-10-CM

## 2019-02-17 DIAGNOSIS — Z9889 Other specified postprocedural states: Secondary | ICD-10-CM | POA: Diagnosis not present

## 2019-02-17 NOTE — Assessment & Plan Note (Signed)
Puncture wound, now has weak thumb adduction. X-rays are unremarkable. We absolutely need an MRI to evaluate for retained foreign body or injury to the adductor pollicis.

## 2019-02-17 NOTE — Assessment & Plan Note (Signed)
Shaun Camacho continues to be in significant pain, he is frustrated that he is unable to work, he is on long-term disability now. This has created anger, I think he has a sensation of helplessness, but he denies any symptoms of depression, suicidality. I would absolutely like him to talk to Shaun Camacho.

## 2019-02-17 NOTE — Assessment & Plan Note (Signed)
Thoracolumbar DDD, x-rays have been unrevealing, he has had several epidurals, his pain is now in the right lower thoracic spine, I think we need a thoracic MRI at this point. He has had greater than 6 weeks of conservative care.

## 2019-02-17 NOTE — Assessment & Plan Note (Signed)
Did not improve with a right-sided epidural with Dr. Mina Marble. I do think he is going to have to simply live with discomfort in his right great toe. Imaging has all been negative, exam is benign.

## 2019-02-17 NOTE — Progress Notes (Signed)
Subjective:    CC: Follow-up  HPI: Shaun Camacho returns, his right great toe still hurts, the exam is completely benign, ultimately we suspected a right L4 radiculitis, he got a right L4 epidural with Dr. Regino SchultzeWang and had no improvement, not even temporary.  In addition he was putting up a window, it broke and he had some glass going to his left thenar eminence.  He was seen in urgent care, x-rays were negative for any retained foreign bodies, unfortunately he continues to have some difficulty with adduction of his left thumb.  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  He is manifesting this in the form of anger, denies any depressive or suicidal symptoms.  He is agreeable to start behavioral therapy.  Low back pain: Pain is at the thoracolumbar junction, he has known multilevel lumbar and thoracic DDD, has failed lumbar epidurals, [facility] and Dr. Regino SchultzeWang for this.    Polyarthralgia: Denies symptoms of depression so I do not think this is myofascial, he has had a negative rheumatoid work-up, but unfortunately continues to endorse achiness of all of his joints including his MCPs, PIPs.  I reviewed the past medical history, family history, social history, surgical history, and allergies today and no changes were needed.  Please see the problem list section below in epic for further details.  Past Medical History: Past Medical History:  Diagnosis Date  . Anxiety and depression 08/23/2016   08/23/2016 PHQ9 = 6, GAD7 = 7 09/12/2016 PHQ9 = 6, GAD7 = 3 10/11/2016 PHQ9 = 1, GAD7 = 1 12/27/2016 PHQ9 = 3, GAD7 = 4 01/24/2017 PHQ9 = 2, GAD7 = 5  . Arthritis   . Complication of anesthesia    when wakes up gets agitated   . GERD (gastroesophageal reflux disease) 12/29/2015  . Male hypogonadism 01/12/2016  . Radiculitis of right cervical region 03/22/2016   [facility] shows moderate to severe right  C6-C7 neuroforaminal stenosis with mass-effect on the exiting right C7 nerve root.   Past Surgical History: Past Surgical History:  Procedure Laterality Date  . ANTERIOR CERVICAL DECOMP/DISCECTOMY FUSION N/A 07/15/2018   Procedure: ANTERIOR CERVICAL DECOMPRESSION FUSION, CERVICAL FIVE-SIX, CERVICAL SIX-SEVEN WITH INSTRUMENTATION AND ALLOGRAFT;  Surgeon: Estill Bambergumonski, Mark, MD;  Location: [facility];  Service: Orthopedics;  Laterality: N/A;  . LATERAL EPICONDYLE RELEASE Right   . PILONIDAL CYST EXCISION N/A 09/03/2018   Procedure: EXCISION OF PILONIDAL DISEASE;  Surgeon: Karie SodaGross, Steven, MD;  Location: [facility];  Service: General;  Laterality: N/A;  . SURGERY SCROTAL / TESTICULAR     Social History: Social History   Socioeconomic History  . Marital status: Married    Spouse name: Not on file  . Number of children: Not on file  . Years of education: Not on file  . Highest education level: Not on file  Occupational History  . Not on file  Social Needs  . Financial resource strain: Not on file  . Food insecurity    Worry: Not on file    Inability: Not on file  . Transportation needs    Medical: Not on file    Non-medical: Not on file  Tobacco Use  . Smoking status: Current Every Day Smoker    Packs/day: 0.50    Years: 18.00    Pack years: 9.00  . Smokeless tobacco: Current User    Types: Chew  Substance and Sexual Activity  .  Alcohol use: Not on file    Comment: rare  . Drug use: No  . Sexual activity: Not on file  Lifestyle  . Physical activity    Days per week: Not on file    Minutes per session: Not on file  . Stress: Not on file  Relationships  . Social Herbalist on phone: Not on file    Gets together: Not on file    Attends religious service: Not on file    Active member of club or organization: Not on file    Attends meetings of clubs or organizations: Not on file    Relationship status: Not on file  Other Topics Concern  . Not on file  Social History Narrative   . Not on file   Family History: Family History  Problem Relation Age of Onset  . Thyroid disease Father   . Thyroid disease Sister   . Diabetes Maternal Grandmother    Allergies: Allergies  Allergen Reactions  . Tramadol Palpitations  . Meloxicam Other (See Comments)    Somnolence   Medications: See med rec.  Review of Systems: No fevers, chills, night sweats, weight loss, chest pain, or shortness of breath.   Objective:    General: Well Developed, well nourished, and in no acute distress.  Neuro: Alert and oriented x3, extra-ocular muscles intact, sensation grossly intact.  HEENT: Normocephalic, atraumatic, pupils equal round reactive to light, neck supple, no masses, no lymphadenopathy, thyroid nonpalpable.  Skin: Warm and dry, no rashes. Cardiac: Regular rate and rhythm, no murmurs rubs or gallops, no lower extremity edema.  Respiratory: Clear to auscultation bilaterally. Not using accessory muscles, speaking in full sentences. Left hand: Visible scar in the thenar eminence, weakness to abduction of the thumb.  Neurovascularly intact distally.  Impression and Recommendations:    Injury of hand, left Puncture wound, now has weak thumb adduction. X-rays are unremarkable. We absolutely need an MRI to evaluate for retained foreign body or injury to the adductor pollicis.  Swelling of first metatarsophalangeal (MTP) joint of right foot Did not improve with a right-sided epidural with Dr. Mina Marble. I do think he is going to have to simply live with discomfort in his right great toe. Imaging has all been negative, exam is benign.  Polyarthralgia Full rheumatoid laboratory work-up is negative. Multiple polyarthralgias. I would like an evaluation with rheumatology as this may represent seronegative rheumatoid arthritis.  Anxiety and depression Cheick continues to be in significant pain, he is frustrated that he is unable to work, he is on long-term disability now. This has  created anger, I think he has a sensation of helplessness, but he denies any symptoms of depression, suicidality. I would absolutely like him to talk to Myra Gianotti.  DDD (degenerative disc disease), thoracolumbar Thoracolumbar DDD, x-rays have been unrevealing, he has had several epidurals, his pain is now in the right lower thoracic spine, I think we need a thoracic MRI at this point. He has had greater than 6 weeks of conservative care.   ___________________________________________ Gwen Her. Dianah Field, M.D., ABFM., CAQSM. Primary Care and Sports Medicine [facility]  Adjunct Professor of [employer]

## 2019-02-17 NOTE — Assessment & Plan Note (Signed)
Full rheumatoid laboratory work-up is negative. Multiple polyarthralgias. I would like an evaluation with rheumatology as this may represent seronegative rheumatoid arthritis.

## 2019-02-18 ENCOUNTER — Encounter: Payer: Self-pay | Admitting: Sports Medicine

## 2019-02-19 NOTE — Telephone Encounter (Signed)
BOO, lame.   But, I have cancelled. Bonnita Nasuti from Imaging calling patient to schedule MRI of hand and advised pt that MRI auth for spine was cancelled

## 2019-02-21 ENCOUNTER — Ambulatory Visit (INDEPENDENT_AMBULATORY_CARE_PROVIDER_SITE_OTHER): Payer: BC Managed Care – PPO

## 2019-02-21 ENCOUNTER — Other Ambulatory Visit: Payer: Self-pay

## 2019-02-21 DIAGNOSIS — R600 Localized edema: Secondary | ICD-10-CM | POA: Diagnosis not present

## 2019-02-21 DIAGNOSIS — M85442 Solitary bone cyst, left hand: Secondary | ICD-10-CM | POA: Diagnosis not present

## 2019-02-21 DIAGNOSIS — S6992XD Unspecified injury of left wrist, hand and finger(s), subsequent encounter: Secondary | ICD-10-CM

## 2019-02-22 DIAGNOSIS — M545 Low back pain: Secondary | ICD-10-CM | POA: Diagnosis not present

## 2019-02-22 DIAGNOSIS — M546 Pain in thoracic spine: Secondary | ICD-10-CM | POA: Diagnosis not present

## 2019-02-25 ENCOUNTER — Other Ambulatory Visit: Payer: Self-pay | Admitting: Sports Medicine

## 2019-02-25 MED ORDER — CYCLOBENZAPRINE HCL 10 MG PO TABS: ORAL_TABLET | ORAL | 2 refills | Status: DC

## 2019-02-26 DIAGNOSIS — M25521 Pain in right elbow: Secondary | ICD-10-CM | POA: Diagnosis not present

## 2019-02-26 DIAGNOSIS — M25531 Pain in right wrist: Secondary | ICD-10-CM | POA: Diagnosis not present

## 2019-02-26 DIAGNOSIS — R29898 Other symptoms and signs involving the musculoskeletal system: Secondary | ICD-10-CM | POA: Diagnosis not present

## 2019-03-01 IMAGING — MR MR LUMBAR SPINE W/O CM
4 of 5 series · 19 of 48 positions shown · non-contrast
Comparison: Lumbar radiographs 05/25/2018

CLINICAL DATA: Acute right-sided low back pain without sciatica

EXAM:
MRI LUMBAR SPINE WITHOUT CONTRAST
TECHNIQUE: Multiplanar, multisequence MR imaging of the lumbar spine was
performed. No intravenous contrast was administered.

[Series 3: T1 · sagittal · 4.0mm · 0.53mm/px · 3 of 13 slices shown (1 of 2)]
[im 3/13]
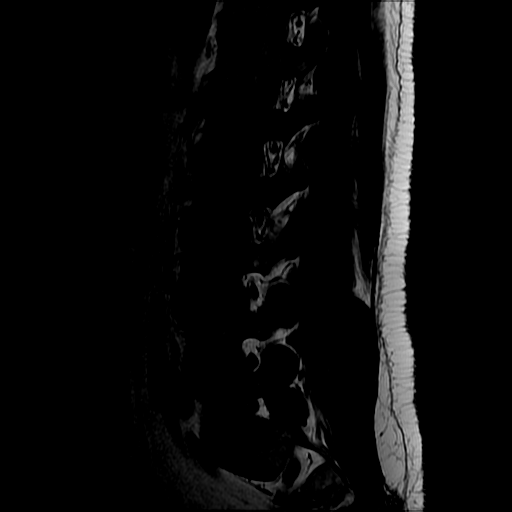
[im 8/13]
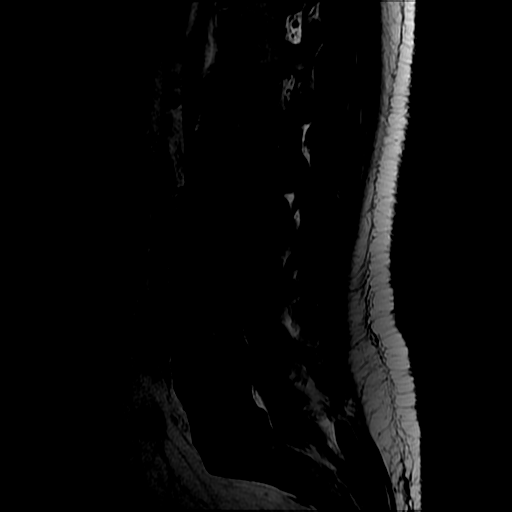
[im 13/13]
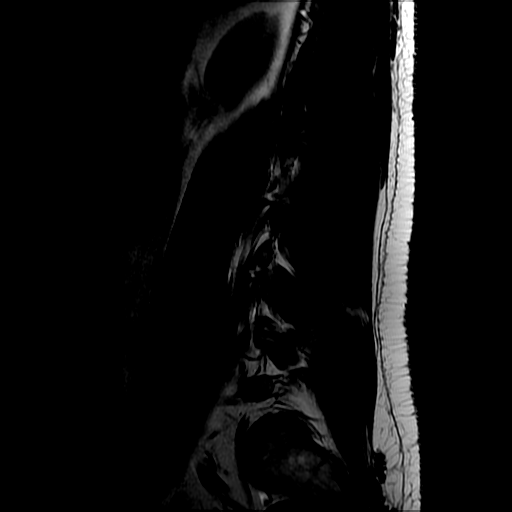

[Series 4: T2 post-contrast · sagittal · 4.0mm · 0.53mm/px · 5 of 13 slices shown]
[im 1/13]
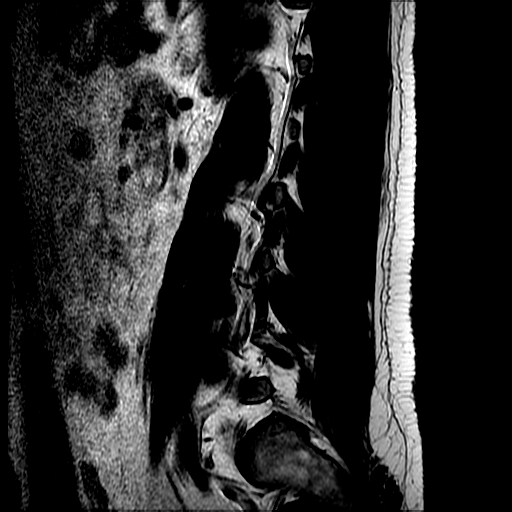
[im 4/13]
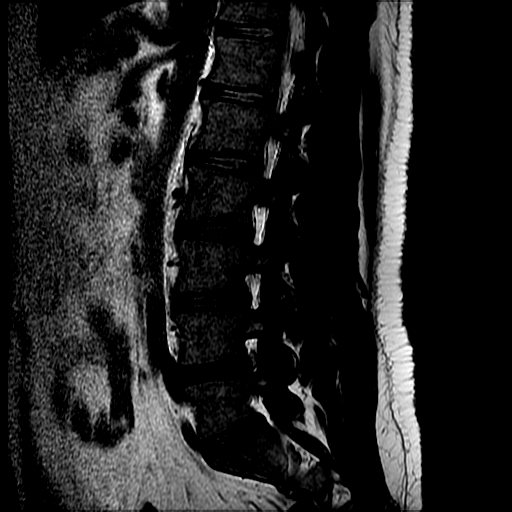
[im 7/13]
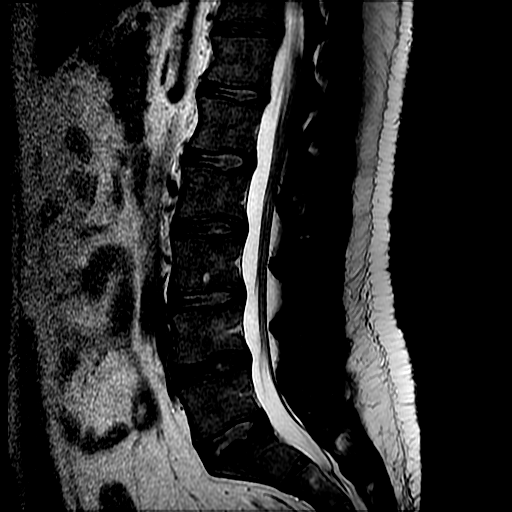
[im 10/13]
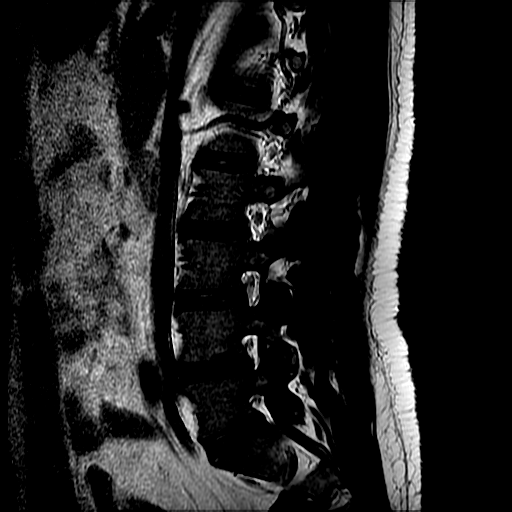
[im 13/13]
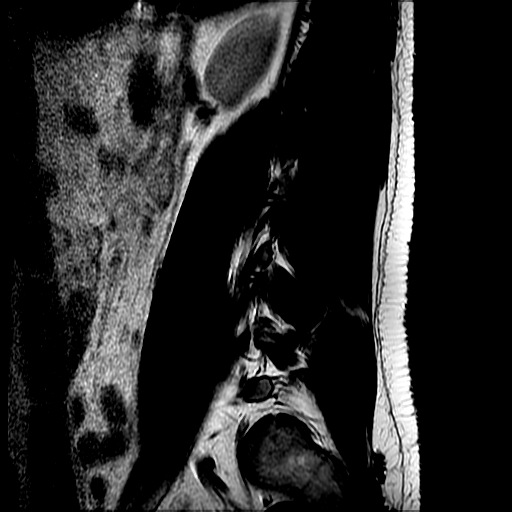

[Series 6: T2 · axial · 4.0mm · 0.39mm/px · z∈[-130,+57]mm · 8 of 41 slices shown]
[im 3/41]
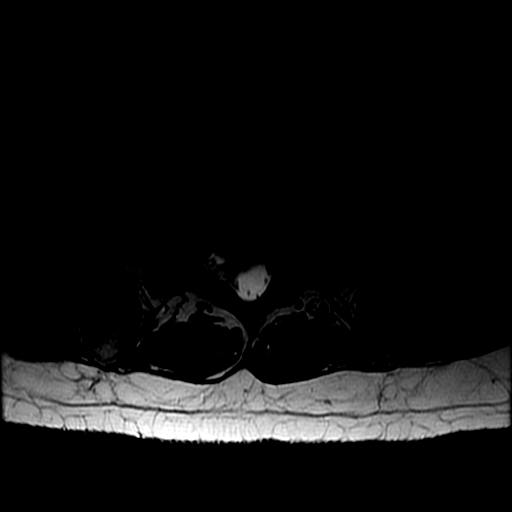
[im 6/41]
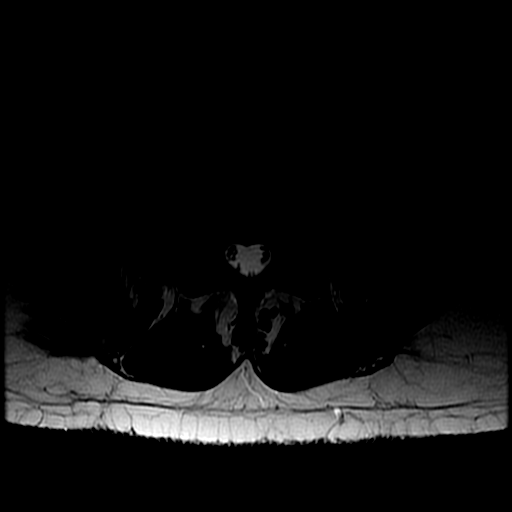
[im 9/41]
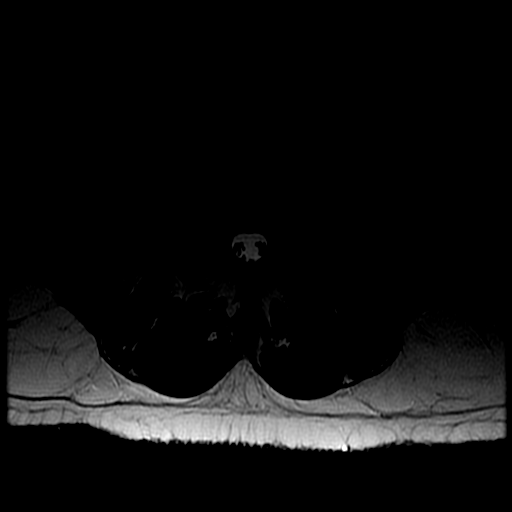
[im 14/41]
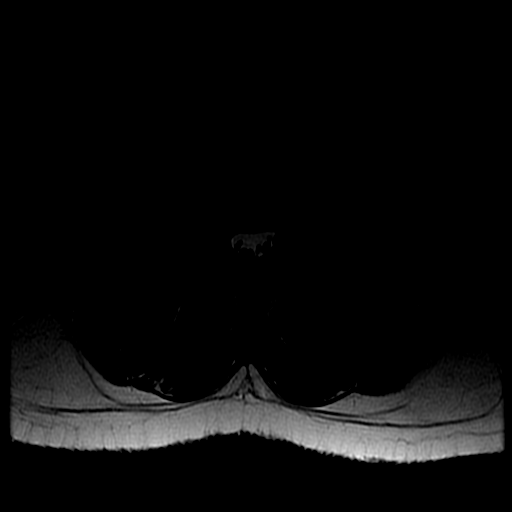
[im 19/41]
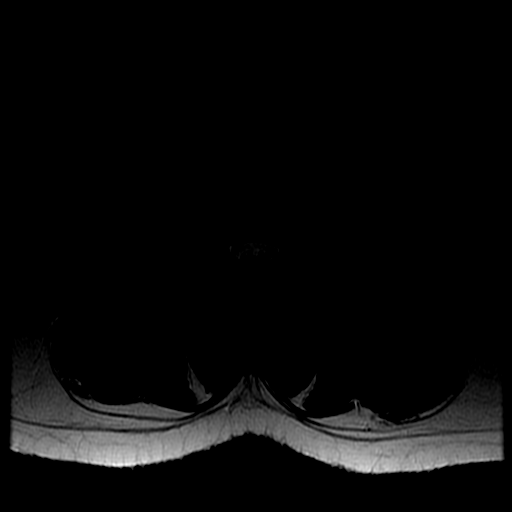
[im 22/41]
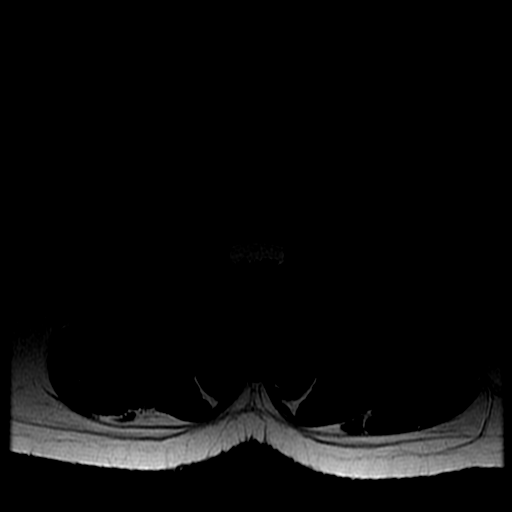
[im 25/41]
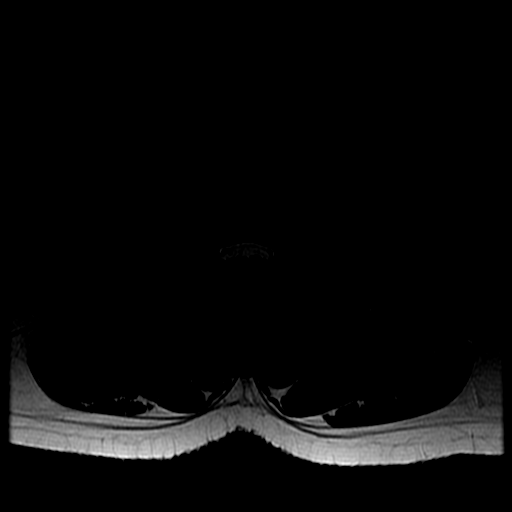
[im 35/41]
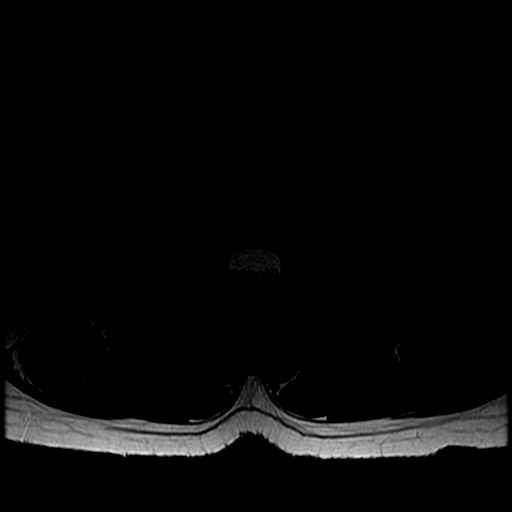

[Series 7: T1 · axial · 4.0mm · 0.39mm/px · z∈[-115,+57]mm · 3 of 41 slices shown (2 of 2)]
[im 6/41]
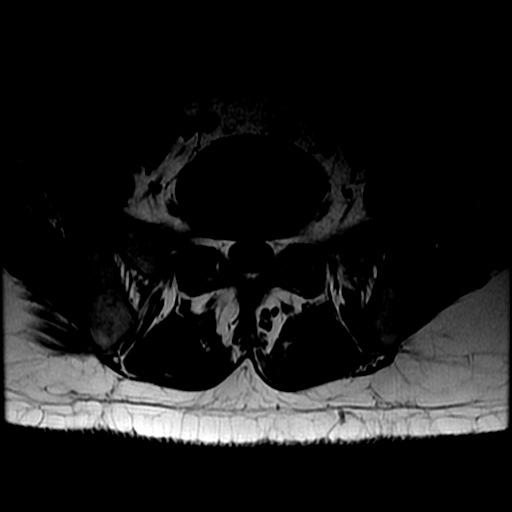
[im 22/41]
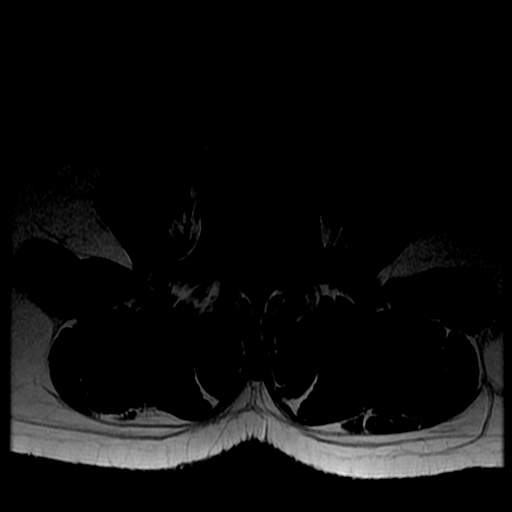
[im 35/41]
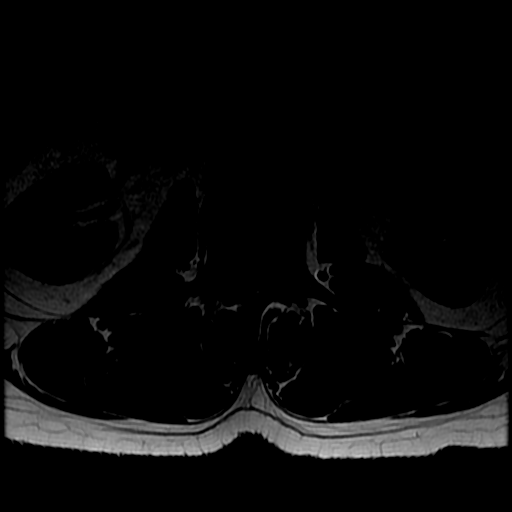

[19 of 48 positions shown; findings below may reference images not displayed]

FINDINGS: Segmentation:  Normal.  Lowest fully developed disc space L5-S1

Alignment:  Normal

Vertebrae:  Negative for fracture or mass.

Conus medullaris and cauda equina: Conus extends to the L1-2 level.
Conus and cauda equina appear normal.

Paraspinal and other soft tissues: Negative for paraspinous mass or
fluid collection. Hemangioma right posterior iliac bone.

Disc levels:

L1-2: Mild disc degeneration and Schmorl's node without stenosis

L2-3: Mild disc degeneration with small Schmorl's node. Negative for
disc protrusion or stenosis

L3-4: Mild disc bulging and Schmorl's node. Mild facet degeneration.
Negative for disc protrusion or stenosis

L4-5: Mild disc bulging and mild facet degeneration. Negative for
disc protrusion or stenosis.

L5-S1: Mild disc and facet degeneration. Negative for disc
protrusion or stenosis.
IMPRESSION: Mild degenerative changes throughout the lumbar spine. No focal disc
protrusion or neural impingement.

## 2019-03-02 ENCOUNTER — Encounter: Payer: Self-pay | Admitting: Sports Medicine

## 2019-03-04 DIAGNOSIS — Z9889 Other specified postprocedural states: Secondary | ICD-10-CM | POA: Diagnosis not present

## 2019-03-04 DIAGNOSIS — M5412 Radiculopathy, cervical region: Secondary | ICD-10-CM | POA: Diagnosis not present

## 2019-03-08 DIAGNOSIS — M25521 Pain in right elbow: Secondary | ICD-10-CM | POA: Diagnosis not present

## 2019-03-08 DIAGNOSIS — R29898 Other symptoms and signs involving the musculoskeletal system: Secondary | ICD-10-CM | POA: Diagnosis not present

## 2019-03-08 DIAGNOSIS — M25531 Pain in right wrist: Secondary | ICD-10-CM | POA: Diagnosis not present

## 2019-03-09 ENCOUNTER — Ambulatory Visit (INDEPENDENT_AMBULATORY_CARE_PROVIDER_SITE_OTHER): Payer: BC Managed Care – PPO

## 2019-03-09 ENCOUNTER — Ambulatory Visit (INDEPENDENT_AMBULATORY_CARE_PROVIDER_SITE_OTHER): Payer: BC Managed Care – PPO | Admitting: Sports Medicine

## 2019-03-09 ENCOUNTER — Other Ambulatory Visit: Payer: Self-pay

## 2019-03-09 DIAGNOSIS — M7702 Medial epicondylitis, left elbow: Secondary | ICD-10-CM | POA: Diagnosis not present

## 2019-03-09 DIAGNOSIS — S61216A Laceration without foreign body of right little finger without damage to nail, initial encounter: Secondary | ICD-10-CM

## 2019-03-09 DIAGNOSIS — S61219A Laceration without foreign body of unspecified finger without damage to nail, initial encounter: Secondary | ICD-10-CM | POA: Insufficient documentation

## 2019-03-09 DIAGNOSIS — M25522 Pain in left elbow: Secondary | ICD-10-CM | POA: Diagnosis not present

## 2019-03-09 MED ORDER — HYDROCODONE-ACETAMINOPHEN 5-325 MG PO TABS: 1.0000 | ORAL_TABLET | Freq: Three times a day (TID) | ORAL | 0 refills | Status: DC | PRN

## 2019-03-09 NOTE — Progress Notes (Signed)
Subjective:    CC: Follow-up  HPI: Shaun Camacho returns, he has had persistent pain in his left elbow, both at the medial epicondyle and now with the distal biceps, he has failed greater than 6 weeks of physician directed conservative measures.  Laceration of finger: Right-sided, occurred recently, up-to-date on tetanus.  I reviewed the past medical history, family history, social history, surgical history, and allergies today and no changes were needed.  Please see the problem list section below in epic for further details.  Past Medical History: Past Medical History:  Diagnosis Date  . Anxiety and depression 08/23/2016   08/23/2016 PHQ9 = 6, GAD7 = 7 09/12/2016 PHQ9 = 6, GAD7 = 3 10/11/2016 PHQ9 = 1, GAD7 = 1 12/27/2016 PHQ9 = 3, GAD7 = 4 01/24/2017 PHQ9 = 2, GAD7 = 5  . Arthritis   . Complication of anesthesia    when wakes up gets agitated   . GERD (gastroesophageal reflux disease) 12/29/2015  . Male hypogonadism 01/12/2016  . Radiculitis of right cervical region 03/22/2016   MRI from [facility] shows moderate to severe right C6-C7 neuroforaminal stenosis with mass-effect on the exiting right C7 nerve root.   Past Surgical History: Past Surgical History:  Procedure Laterality Date  . ANTERIOR CERVICAL DECOMP/DISCECTOMY FUSION N/A 07/15/2018   Procedure: ANTERIOR CERVICAL DECOMPRESSION FUSION, CERVICAL FIVE-SIX, CERVICAL SIX-SEVEN WITH INSTRUMENTATION AND ALLOGRAFT;  Surgeon: Phylliss Bob, MD;  Location: [place];  Service: Orthopedics;  Laterality: N/A;  . LATERAL EPICONDYLE RELEASE Right   . PILONIDAL CYST EXCISION N/A 09/03/2018   Procedure: EXCISION OF PILONIDAL DISEASE;  Surgeon: Michael Boston, MD;  Location: [facility];  Service: General;  Laterality: N/A;  . SURGERY SCROTAL / TESTICULAR     Social History: Social History   Socioeconomic History  . Marital status: Married    Spouse name: Not on file  . Number of children: Not on file  . Years of education: Not on file  . Highest  education level: Not on file  Occupational History  . Not on file  Social Needs  . Financial resource strain: Not on file  . Food insecurity    Worry: Not on file    Inability: Not on file  . Transportation needs    Medical: Not on file    Non-medical: Not on file  Tobacco Use  . Smoking status: Current Every Day Smoker    Packs/day: 0.50    Years: 18.00    Pack years: 9.00  . Smokeless tobacco: Current User    Types: Chew  Substance and Sexual Activity  . Alcohol use: Not on file    Comment: rare  . Drug use: No  . Sexual activity: Not on file  Lifestyle  . Physical activity    Days per week: Not on file    Minutes per session: Not on file  . Stress: Not on file  Relationships  . Social Herbalist on phone: Not on file    Gets together: Not on file    Attends religious service: Not on file    Active member of club or organization: Not on file    Attends meetings of clubs or organizations: Not on file    Relationship status: Not on file  Other Topics Concern  . Not on file  Social History Narrative  . Not on file   Family History: Family History  Problem Relation Age of Onset  . Thyroid disease Father   . Thyroid disease Sister   .  Diabetes Maternal Grandmother    Allergies: Allergies  Allergen Reactions  . Tramadol Palpitations  . Meloxicam Other (See Comments)    Somnolence   Medications: See med rec.  Review of Systems: No fevers, chills, night sweats, weight loss, chest pain, or shortness of breath.   Objective:    General: Well Developed, well nourished, and in no acute distress.  Neuro: Alert and oriented x3, extra-ocular muscles intact, sensation grossly intact.  HEENT: Normocephalic, atraumatic, pupils equal round reactive to light, neck supple, no masses, no lymphadenopathy, thyroid nonpalpable.  Skin: Warm and dry, no rashes. Cardiac: Regular rate and rhythm, no murmurs rubs or gallops, no lower extremity edema.  Respiratory: Clear  to auscultation bilaterally. Not using accessory muscles, speaking in full sentences. Left elbow: Unremarkable to inspection. Range of motion full pronation, supination, flexion, extension. Strength is full to all of the above directions Stable to varus, valgus stress. Negative moving valgus stress test. Tender palpation at the common flexor tendon origin, also tender at the distal biceps Ulnar nerve does not sublux. Negative cubital tunnel Tinel's. Right hand: There is a 2.6 cm laceration over the dorsal right fifth finger, directly over the DIP.  Good extension, good flexion.  Neurovascularly intact distally.  Laceration repair: Indication: bleeding Location: Right fifth DIP Size: 2.6 cm Anesthesia: None needed Wound explored, irrigated, FB removed (if present) Dermabond used to approximate the edges of the skin Tolerated well Routine postprocedure instructions d/w pt- keep area clean and bandaged, follow up if concerns/spreading erythema/pain.   Follow-up for a wound check  Impression and Recommendations:    Medial epicondylitis, left Persistent discomfort at the left medial epicondyle, now has some discomfort at the distal biceps as well, proceeding with MRI. X-rays today. Unable to do injection, he does have a laceration on his right fifth finger. Adding a bit of hydrocodone in the meantime.  Laceration of finger, right Neurovascularly intact distally, extension apparatus is intact. Dermabond applied. Up-to-date on Tdap.   ___________________________________________ Ihor Austinhomas J. Benjamin Stainhekkekandam, M.D., ABFM., CAQSM. Primary Care and Sports Medicine [facility]  Adjunct Professor of Family Medicine  [employer]

## 2019-03-09 NOTE — Assessment & Plan Note (Signed)
Neurovascularly intact distally, extension apparatus is intact. Dermabond applied. Up-to-date on Tdap.

## 2019-03-09 NOTE — Assessment & Plan Note (Signed)
Persistent discomfort at the left medial epicondyle, now has some discomfort at the distal biceps as well, proceeding with MRI. X-rays today. Unable to do injection, he does have a laceration on his right fifth finger. Adding a bit of hydrocodone in the meantime.

## 2019-03-10 ENCOUNTER — Encounter: Payer: Self-pay | Admitting: Sports Medicine

## 2019-03-11 ENCOUNTER — Ambulatory Visit (INDEPENDENT_AMBULATORY_CARE_PROVIDER_SITE_OTHER): Payer: BC Managed Care – PPO | Admitting: Sports Medicine

## 2019-03-11 ENCOUNTER — Other Ambulatory Visit: Payer: Self-pay

## 2019-03-11 ENCOUNTER — Encounter: Payer: Self-pay | Admitting: Sports Medicine

## 2019-03-11 DIAGNOSIS — S61216D Laceration without foreign body of right little finger without damage to nail, subsequent encounter: Secondary | ICD-10-CM | POA: Diagnosis not present

## 2019-03-11 NOTE — Assessment & Plan Note (Signed)
Dermabond unfortunately came off, I sutured his laceration primarily after aggressive irrigation and debridement. Return in 10 days for suture removal.

## 2019-03-11 NOTE — Progress Notes (Signed)
Subjective:    CC: Laceration  HPI: Shaun Camacho returns, we close his laceration with Dermabond, unfortunately this came off.  I reviewed the past medical history, family history, social history, surgical history, and allergies today and no changes were needed.  Please see the problem list section below in epic for further details.  Past Medical History: Past Medical History:  Diagnosis Date  . Anxiety and depression 08/23/2016   08/23/2016 PHQ9 = 6, GAD7 = 7 09/12/2016 PHQ9 = 6, GAD7 = 3 10/11/2016 PHQ9 = 1, GAD7 = 1 12/27/2016 PHQ9 = 3, GAD7 = 4 01/24/2017 PHQ9 = 2, GAD7 = 5  . Arthritis   . Complication of anesthesia    when wakes up gets agitated   . GERD (gastroesophageal reflux disease) 12/29/2015  . Male hypogonadism 01/12/2016  . Radiculitis of right cervical region 03/22/2016   MRI from [facility] shows moderate to severe right C6-C7 neuroforaminal stenosis with mass-effect on the exiting right C7 nerve root.   Past Surgical History: Past Surgical History:  Procedure Laterality Date  . ANTERIOR CERVICAL DECOMP/DISCECTOMY FUSION N/A 07/15/2018   Procedure: ANTERIOR CERVICAL DECOMPRESSION FUSION, CERVICAL FIVE-SIX, CERVICAL SIX-SEVEN WITH INSTRUMENTATION AND ALLOGRAFT;  Surgeon: Phylliss Bob, MD;  Location: [place];  Service: Orthopedics;  Laterality: N/A;  . LATERAL EPICONDYLE RELEASE Right   . PILONIDAL CYST EXCISION N/A 09/03/2018   Procedure: EXCISION OF PILONIDAL DISEASE;  Surgeon: Michael Boston, MD;  [facility];  Service: General;  Laterality: N/A;  . SURGERY SCROTAL / TESTICULAR     Social History: Social History   Socioeconomic History  . Marital status: Married    Spouse name: Not on file  . Number of children: Not on file  . Years of education: Not on file  . Highest education level: Not on file  Occupational History  . Not on file  Social Needs  . Financial resource strain: Not on file  . Food insecurity    Worry: Not on file    Inability: Not on file  .  Transportation needs    Medical: Not on file    Non-medical: Not on file  Tobacco Use  . Smoking status: Current Every Day Smoker    Packs/day: 0.50    Years: 18.00    Pack years: 9.00  . Smokeless tobacco: Current User    Types: Chew  Substance and Sexual Activity  . Alcohol use: Not on file    Comment: rare  . Drug use: No  . Sexual activity: Not on file  Lifestyle  . Physical activity    Days per week: Not on file    Minutes per session: Not on file  . Stress: Not on file  Relationships  . Social Herbalist on phone: Not on file    Gets together: Not on file    Attends religious service: Not on file    Active member of club or organization: Not on file    Attends meetings of clubs or organizations: Not on file    Relationship status: Not on file  Other Topics Concern  . Not on file  Social History Narrative  . Not on file   Family History: Family History  Problem Relation Age of Onset  . Thyroid disease Father   . Thyroid disease Sister   . Diabetes Maternal Grandmother    Allergies: Allergies  Allergen Reactions  . Tramadol Palpitations  . Meloxicam Other (See Comments)    Somnolence   Medications: See med  rec.  Review of Systems: No fevers, chills, night sweats, weight loss, chest pain, or shortness of breath.   Objective:    General: Well Developed, well nourished, and in no acute distress.  Neuro: Alert and oriented x3, extra-ocular muscles intact, sensation grossly intact.  HEENT: Normocephalic, atraumatic, pupils equal round reactive to light, neck supple, no masses, no lymphadenopathy, thyroid nonpalpable.  Skin: Warm and dry, no rashes. Cardiac: Regular rate and rhythm, no murmurs rubs or gallops, no lower extremity edema.  Respiratory: Clear to auscultation bilaterally. Not using accessory muscles, speaking in full sentences. Right hand: There is a laceration into the deep subcutaneous tissues over the dorsum of the DIP extending  proximally over the PIP.  His extensor apparatus and extensor digiti minimi tendons are not visible and are functional.  Laceration repair: Indication: bleeding Location: Right fifth finger Size: 3 cm Anesthesia: 1%lidocaine without epi, good effect Wound explored, irrigated, FB removed (if present) Type of suture material: 4-0 Vicryl Number of sutures: 4 Tolerated well Routine postprocedure instructions d/w pt- keep area clean and bandaged, follow up if concerns/spreading erythema/pain.   Follow up for for suture removal.  Impression and Recommendations:    Laceration of finger, right Dermabond unfortunately came off, I sutured his laceration primarily after aggressive irrigation and debridement. Return in 10 days for suture removal.   ___________________________________________ Shaun Camacho, M.D., ABFM., CAQSM. Primary Care and Sports Medicine [facility]  Adjunct Professor of Family Medicine  [employer]

## 2019-03-17 ENCOUNTER — Other Ambulatory Visit: Payer: Self-pay

## 2019-03-17 ENCOUNTER — Ambulatory Visit (INDEPENDENT_AMBULATORY_CARE_PROVIDER_SITE_OTHER): Payer: BC Managed Care – PPO

## 2019-03-17 DIAGNOSIS — M7702 Medial epicondylitis, left elbow: Secondary | ICD-10-CM

## 2019-03-17 DIAGNOSIS — M25521 Pain in right elbow: Secondary | ICD-10-CM | POA: Diagnosis not present

## 2019-03-18 DIAGNOSIS — Z9889 Other specified postprocedural states: Secondary | ICD-10-CM | POA: Diagnosis not present

## 2019-03-18 DIAGNOSIS — G8929 Other chronic pain: Secondary | ICD-10-CM | POA: Diagnosis not present

## 2019-03-18 DIAGNOSIS — M5412 Radiculopathy, cervical region: Secondary | ICD-10-CM | POA: Diagnosis not present

## 2019-03-18 DIAGNOSIS — M5441 Lumbago with sciatica, right side: Secondary | ICD-10-CM | POA: Diagnosis not present

## 2019-03-22 ENCOUNTER — Ambulatory Visit (INDEPENDENT_AMBULATORY_CARE_PROVIDER_SITE_OTHER): Payer: BC Managed Care – PPO | Admitting: Sports Medicine

## 2019-03-22 ENCOUNTER — Other Ambulatory Visit: Payer: Self-pay

## 2019-03-22 ENCOUNTER — Encounter: Payer: Self-pay | Admitting: Sports Medicine

## 2019-03-22 DIAGNOSIS — S61216D Laceration without foreign body of right little finger without damage to nail, subsequent encounter: Secondary | ICD-10-CM | POA: Diagnosis not present

## 2019-03-22 NOTE — Progress Notes (Signed)
Subjective:    CC: Follow-up  HPI: Finger laceration: Stitches did come out on their own somehow.  I reviewed the past medical history, family history, social history, surgical history, and allergies today and no changes were needed.  Please see the problem list section below in epic for further details.  Past Medical History: Past Medical History:  Diagnosis Date  . Anxiety and depression 08/23/2016   08/23/2016 PHQ9 = 6, GAD7 = 7 09/12/2016 PHQ9 = 6, GAD7 = 3 10/11/2016 PHQ9 = 1, GAD7 = 1 12/27/2016 PHQ9 = 3, GAD7 = 4 01/24/2017 PHQ9 = 2, GAD7 = 5  . Arthritis   . Complication of anesthesia    when wakes up gets agitated   . GERD (gastroesophageal reflux disease) 12/29/2015  . Male hypogonadism 01/12/2016  . Radiculitis of right cervical region 03/22/2016   MRI from [facility] shows moderate to severe right C6-C7 neuroforaminal stenosis with mass-effect on the exiting right C7 nerve root.   Past Surgical History: Past Surgical History:  Procedure Laterality Date  . ANTERIOR CERVICAL DECOMP/DISCECTOMY FUSION N/A 07/15/2018   Procedure: ANTERIOR CERVICAL DECOMPRESSION FUSION, CERVICAL FIVE-SIX, CERVICAL SIX-SEVEN WITH INSTRUMENTATION AND ALLOGRAFT;  Surgeon: Phylliss Bob, MD;  Location: [place];  Service: Orthopedics;  Laterality: N/A;  . LATERAL EPICONDYLE RELEASE Right   . PILONIDAL CYST EXCISION N/A 09/03/2018   Procedure: EXCISION OF PILONIDAL DISEASE;  Surgeon: Michael Boston, MD;  Location[facility];  Service: General;  Laterality: N/A;  . SURGERY SCROTAL / TESTICULAR     Social History: Social History   Socioeconomic History  . Marital status: Married    Spouse name: Not on file  . Number of children: Not on file  . Years of education: Not on file  . Highest education level: Not on file  Occupational History  . Not on file  Social Needs  . Financial resource strain: Not on file  . Food insecurity    Worry: Not on file    Inability: Not on file  . Transportation needs    Medical: Not on file    Non-medical: Not on file  Tobacco Use  . Smoking status: Current Every Day Smoker    Packs/day: 0.50    Years: 18.00    Pack years: 9.00  . Smokeless tobacco: Current User    Types: Chew  Substance and Sexual Activity  . Alcohol use: Not on file    Comment: rare  . Drug use: No  . Sexual activity: Not on file  Lifestyle  . Physical activity    Days per week: Not on file    Minutes per session: Not on file  . Stress: Not on file  Relationships  . Social Herbalist on phone: Not on file    Gets together: Not on file    Attends religious service: Not on file    Active member of club or organization: Not on file    Attends meetings of clubs or organizations: Not on file    Relationship status: Not on file  Other Topics Concern  . Not on file  Social History Narrative  . Not on file   Family History: Family History  Problem Relation Age of Onset  . Thyroid disease Father   . Thyroid disease Sister   . Diabetes Maternal Grandmother    Allergies: Allergies  Allergen Reactions  . Tramadol Palpitations  . Meloxicam Other (See Comments)    Somnolence   Medications: See med rec.  Review  of Systems: No fevers, chills, night sweats, weight loss, chest pain, or shortness of breath.   Objective:    General: Well Developed, well nourished, and in no acute distress.  Neuro: Alert and oriented x3, extra-ocular muscles intact, sensation grossly intact.  HEENT: Normocephalic, atraumatic, pupils equal round reactive to light, neck supple, no masses, no lymphadenopathy, thyroid nonpalpable.  Skin: Warm and dry, no rashes. Cardiac: Regular rate and rhythm, no murmurs rubs or gallops, no lower extremity edema.  Respiratory: Clear to auscultation bilaterally. Not using accessory muscles, speaking in full sentences. Right fifth finger: Laceration is still intact, through the epidermis and dermis, I do see some underlying granulation tissue, it does  not look like the subcutaneous tissue is exposed.  This was strapped with a compressive dressing.  Impression and Recommendations:    Laceration of finger, right Laceration will need to heal for the most part with secondary intention, he will keep it covered and compressed.   ___________________________________________ Ihor Austinhomas J. Benjamin Stainhekkekandam, M.D., ABFM., CAQSM. Primary Care and Sports Medicine [facility]  Adjunct Professor of Family Medicine  [employer]

## 2019-03-22 NOTE — Assessment & Plan Note (Signed)
Laceration will need to heal for the most part with secondary intention, he will keep it covered and compressed.

## 2019-03-23 DIAGNOSIS — M5116 Intervertebral disc disorders with radiculopathy, lumbar region: Secondary | ICD-10-CM | POA: Diagnosis not present

## 2019-03-23 DIAGNOSIS — M47816 Spondylosis without myelopathy or radiculopathy, lumbar region: Secondary | ICD-10-CM | POA: Diagnosis not present

## 2019-03-24 ENCOUNTER — Ambulatory Visit: Payer: BC Managed Care – PPO | Admitting: Sports Medicine

## 2019-03-25 DIAGNOSIS — M542 Cervicalgia: Secondary | ICD-10-CM | POA: Diagnosis not present

## 2019-03-25 DIAGNOSIS — G8929 Other chronic pain: Secondary | ICD-10-CM | POA: Diagnosis not present

## 2019-03-25 DIAGNOSIS — Z79899 Other long term (current) drug therapy: Secondary | ICD-10-CM | POA: Diagnosis not present

## 2019-03-25 DIAGNOSIS — M545 Low back pain: Secondary | ICD-10-CM | POA: Diagnosis not present

## 2019-03-25 DIAGNOSIS — M5416 Radiculopathy, lumbar region: Secondary | ICD-10-CM | POA: Diagnosis not present

## 2019-03-28 ENCOUNTER — Encounter: Payer: Self-pay | Admitting: Sports Medicine

## 2019-03-29 ENCOUNTER — Encounter: Payer: Self-pay | Admitting: Sports Medicine

## 2019-03-29 DIAGNOSIS — G5603 Carpal tunnel syndrome, bilateral upper limbs: Secondary | ICD-10-CM

## 2019-03-29 DIAGNOSIS — Z09 Encounter for follow-up examination after completed treatment for conditions other than malignant neoplasm: Secondary | ICD-10-CM | POA: Diagnosis not present

## 2019-03-29 NOTE — Assessment & Plan Note (Signed)
Known bilateral carpal tunnel syndrome, failed nighttime splinting, he was on Lyrica 100 twice a day, gabapentin was intolerable. I am going to proceed with another nerve conduction study and EMG to ensure this is carpal tunnel syndrome before proceeding with Hydro dissection.

## 2019-03-30 DIAGNOSIS — R29898 Other symptoms and signs involving the musculoskeletal system: Secondary | ICD-10-CM | POA: Diagnosis not present

## 2019-03-30 DIAGNOSIS — Z9889 Other specified postprocedural states: Secondary | ICD-10-CM | POA: Diagnosis not present

## 2019-03-30 DIAGNOSIS — M25521 Pain in right elbow: Secondary | ICD-10-CM | POA: Diagnosis not present

## 2019-03-30 DIAGNOSIS — M5441 Lumbago with sciatica, right side: Secondary | ICD-10-CM | POA: Diagnosis not present

## 2019-03-30 DIAGNOSIS — G8929 Other chronic pain: Secondary | ICD-10-CM | POA: Diagnosis not present

## 2019-03-30 DIAGNOSIS — M5412 Radiculopathy, cervical region: Secondary | ICD-10-CM | POA: Diagnosis not present

## 2019-03-30 DIAGNOSIS — M25531 Pain in right wrist: Secondary | ICD-10-CM | POA: Diagnosis not present

## 2019-04-02 ENCOUNTER — Telehealth: Payer: Self-pay

## 2019-04-02 ENCOUNTER — Encounter: Payer: Self-pay | Admitting: Sports Medicine

## 2019-04-02 DIAGNOSIS — Z5181 Encounter for therapeutic drug level monitoring: Secondary | ICD-10-CM

## 2019-04-02 DIAGNOSIS — M5412 Radiculopathy, cervical region: Secondary | ICD-10-CM | POA: Diagnosis not present

## 2019-04-02 NOTE — Telephone Encounter (Signed)
Added to billing trailer.  °

## 2019-04-02 NOTE — Telephone Encounter (Signed)
I have added a diagnosis of therapeutic drug monitoring, try to use that ICD.

## 2019-04-02 NOTE — Telephone Encounter (Signed)
PSA not covered by insurance.   Codes covered.   C61 Malignant neoplasm of prostate C79.51 Secondary malignant neoplasm of bone N40.0 Benign prostatic hyperplasia without lower urinary tract symptoms N40.1 Benign prostatic hyperplasia with lower urinary tract symptoms N40.2 Nodular prostate without lower urinary tract symptoms N41.9 Inflammatory disease of prostate, unspecified N42.9 Disorder of prostate, unspecified R31.0 Gross hematuria R31.29 Other microscopic hematuria R31.9 Hematuria, unspecified R33.9 Retention of urine, unspecified R35.0 Frequency of micturition R35.1 Nocturia R39.12 Poor urinary stream R39.14 Feeling of incomplete bladder emptying R39.15 Urgency of urination R97.20 Elevated prostate specific antigen [PSA] R97.21 Rising PSA following treatment for malignant neoplasm of prostate Z12.5 Encounter for screening for malignant neoplasm of prostate Z85.46 Personal history of malignant neoplasm of prostate

## 2019-04-05 DIAGNOSIS — Z9889 Other specified postprocedural states: Secondary | ICD-10-CM | POA: Diagnosis not present

## 2019-04-05 DIAGNOSIS — R200 Anesthesia of skin: Secondary | ICD-10-CM | POA: Diagnosis not present

## 2019-04-05 DIAGNOSIS — M5441 Lumbago with sciatica, right side: Secondary | ICD-10-CM | POA: Diagnosis not present

## 2019-04-05 DIAGNOSIS — M5412 Radiculopathy, cervical region: Secondary | ICD-10-CM | POA: Diagnosis not present

## 2019-04-05 DIAGNOSIS — G8929 Other chronic pain: Secondary | ICD-10-CM | POA: Diagnosis not present

## 2019-04-08 ENCOUNTER — Ambulatory Visit (INDEPENDENT_AMBULATORY_CARE_PROVIDER_SITE_OTHER): Payer: BC Managed Care – PPO | Admitting: Psychology

## 2019-04-08 DIAGNOSIS — F1020 Alcohol dependence, uncomplicated: Secondary | ICD-10-CM | POA: Diagnosis not present

## 2019-04-08 DIAGNOSIS — F329 Major depressive disorder, single episode, unspecified: Secondary | ICD-10-CM | POA: Diagnosis not present

## 2019-04-08 DIAGNOSIS — M542 Cervicalgia: Secondary | ICD-10-CM | POA: Diagnosis not present

## 2019-04-08 DIAGNOSIS — M62838 Other muscle spasm: Secondary | ICD-10-CM | POA: Diagnosis not present

## 2019-04-08 DIAGNOSIS — G8929 Other chronic pain: Secondary | ICD-10-CM | POA: Diagnosis not present

## 2019-04-08 DIAGNOSIS — F419 Anxiety disorder, unspecified: Secondary | ICD-10-CM

## 2019-04-08 DIAGNOSIS — Z79899 Other long term (current) drug therapy: Secondary | ICD-10-CM | POA: Diagnosis not present

## 2019-04-08 DIAGNOSIS — M545 Low back pain: Secondary | ICD-10-CM | POA: Diagnosis not present

## 2019-04-09 DIAGNOSIS — Z111 Encounter for screening for respiratory tuberculosis: Secondary | ICD-10-CM | POA: Diagnosis not present

## 2019-04-09 DIAGNOSIS — M5136 Other intervertebral disc degeneration, lumbar region: Secondary | ICD-10-CM | POA: Diagnosis not present

## 2019-04-09 DIAGNOSIS — Z79899 Other long term (current) drug therapy: Secondary | ICD-10-CM | POA: Diagnosis not present

## 2019-04-09 DIAGNOSIS — M0880 Other juvenile arthritis, unspecified site: Secondary | ICD-10-CM | POA: Diagnosis not present

## 2019-04-15 ENCOUNTER — Encounter: Payer: Self-pay | Admitting: Sports Medicine

## 2019-04-15 ENCOUNTER — Ambulatory Visit (INDEPENDENT_AMBULATORY_CARE_PROVIDER_SITE_OTHER): Payer: BC Managed Care – PPO | Admitting: Sports Medicine

## 2019-04-15 ENCOUNTER — Other Ambulatory Visit: Payer: Self-pay

## 2019-04-15 DIAGNOSIS — M7702 Medial epicondylitis, left elbow: Secondary | ICD-10-CM | POA: Diagnosis not present

## 2019-04-15 NOTE — Assessment & Plan Note (Signed)
The left medial epicondyles/flexor tendon origin injection. MRI did show common flexor tendinosis. Return to see me in 1 month. If insufficient improvement we will proceed with PRP percutaneous tenotomy of the common flexor tendon origin.

## 2019-04-15 NOTE — Progress Notes (Signed)
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  Pain is moderate, persistent, localized without radiation, has not responded to conservative treatment.  We are holding off on injection due to a finger laceration which is now well-healed.  I reviewed the past medical history, family history, social history, surgical history, and allergies today and no changes were needed.  Please see the problem list section below in epic for further details.  Past Medical History: Past Medical History:  Diagnosis Date  . Anxiety and depression 08/23/2016   08/23/2016 PHQ9 = 6, GAD7 = 7 09/12/2016 PHQ9 = 6, GAD7 = 3 10/11/2016 PHQ9 = 1, GAD7 = 1 12/27/2016 PHQ9 = 3, GAD7 = 4 01/24/2017 PHQ9 = 2, GAD7 = 5  . Arthritis   . Complication of anesthesia    when wakes up gets agitated   . GERD (gastroesophageal reflux disease) 12/29/2015  . Male hypogonadism 01/12/2016  . Radiculitis of right cervical region 03/22/2016   MRI from [facility] shows moderate to severe right C6-C7 neuroforaminal stenosis with mass-effect on the exiting right C7 nerve root.   Past Surgical History: Past Surgical History:  Procedure Laterality Date  . ANTERIOR CERVICAL DECOMP/DISCECTOMY FUSION N/A 07/15/2018   Procedure: ANTERIOR CERVICAL DECOMPRESSION FUSION, CERVICAL FIVE-SIX, CERVICAL SIX-SEVEN WITH INSTRUMENTATION AND ALLOGRAFT;  Surgeon: Phylliss Bob, MD;  Location: [place];  Service: Orthopedics;  Laterality: N/A;  . LATERAL EPICONDYLE RELEASE Right   . PILONIDAL CYST EXCISION N/A 09/03/2018   Procedure: EXCISION OF PILONIDAL DISEASE;  Surgeon: Michael Boston, MD;  [facility];  Service: General;  Laterality: N/A;  . SURGERY SCROTAL / TESTICULAR     Social History: Social History   Socioeconomic History  . Marital status: Married    Spouse name: Not on file  . Number of children: Not on file  . Years of  education: Not on file  . Highest education level: Not on file  Occupational History  . Not on file  Social Needs  . Financial resource strain: Not on file  . Food insecurity    Worry: Not on file    Inability: Not on file  . Transportation needs    Medical: Not on file    Non-medical: Not on file  Tobacco Use  . Smoking status: Current Every Day Smoker    Packs/day: 0.50    Years: 18.00    Pack years: 9.00  . Smokeless tobacco: Current User    Types: Chew  Substance and Sexual Activity  . Alcohol use: Not on file    Comment: rare  . Drug use: No  . Sexual activity: Not on file  Lifestyle  . Physical activity    Days per week: Not on file    Minutes per session: Not on file  . Stress: Not on file  Relationships  . Social Herbalist on phone: Not on file    Gets together: Not on file    Attends religious service: Not on file    Active member of club or organization: Not on file    Attends meetings of clubs or organizations: Not on file    Relationship status: Not on file  Other Topics Concern  . Not on file  Social History Narrative  . Not on file   Family History: Family History  Problem Relation Age of Onset  . Thyroid disease Father   .  Thyroid disease Sister   . Diabetes Maternal Grandmother    Allergies: Allergies  Allergen Reactions  . Tramadol Palpitations  . Meloxicam Other (See Comments)    Somnolence   Medications: See med rec.  Review of Systems: No fevers, chills, night sweats, weight loss, chest pain, or shortness of breath.   Objective:    General: Well Developed, well nourished, and in no acute distress.  Neuro: Alert and oriented x3, extra-ocular muscles intact, sensation grossly intact.  HEENT: Normocephalic, atraumatic, pupils equal round reactive to light, neck supple, no masses, no lymphadenopathy, thyroid nonpalpable.  Skin: Warm and dry, no rashes. Cardiac: Regular rate and rhythm, no murmurs rubs or gallops, no lower  extremity edema.  Respiratory: Clear to auscultation bilaterally. Not using accessory muscles, speaking in full sentences. Left elbow: Unremarkable to inspection. Range of motion full pronation, supination, flexion, extension. Strength is full to all of the above directions Stable to varus, valgus stress. Negative moving valgus stress test. Tender to palpation of the common flexor origin Ulnar nerve does not sublux. Negative cubital tunnel Tinel's.  Procedure: Real-time Ultrasound Guided injection of the left common flexor tendon origin Device: GE Logiq E  Verbal informed consent obtained.  Time-out conducted.  Noted no overlying erythema, induration, or other signs of local infection.  Skin prepped in a sterile fashion.  Local anesthesia: Topical Ethyl chloride.  With sterile technique and under real time ultrasound guidance:  1 cc Kenalog 40, 1 cc lidocaine, 1 cc bupivacaine injected easily. Completed without difficulty  Pain immediately resolved suggesting accurate placement of the medication.  Advised to call if fevers/chills, erythema, induration, drainage, or persistent bleeding.  Images permanently stored and available for review in the ultrasound unit.  Impression: Technically successful ultrasound guided injection.  Impression and Recommendations:    Medial epicondylitis, left The left medial epicondyles/flexor tendon origin injection. MRI did show common flexor tendinosis. Return to see me in 1 month. If insufficient improvement we will proceed with PRP percutaneous tenotomy of the common flexor tendon origin.   ___________________________________________ [name]. Benjamin Stain, M.D., ABFM., CAQSM. Primary Care and Sports Medicine [facility]  Adjunct Professor of Family Medicine  [employer]

## 2019-04-19 ENCOUNTER — Encounter: Payer: Self-pay | Admitting: Sports Medicine

## 2019-04-21 DIAGNOSIS — M5412 Radiculopathy, cervical region: Secondary | ICD-10-CM | POA: Diagnosis not present

## 2019-04-23 ENCOUNTER — Encounter: Payer: Self-pay | Admitting: Sports Medicine

## 2019-04-26 DIAGNOSIS — Z9889 Other specified postprocedural states: Secondary | ICD-10-CM | POA: Diagnosis not present

## 2019-04-26 DIAGNOSIS — M5441 Lumbago with sciatica, right side: Secondary | ICD-10-CM | POA: Diagnosis not present

## 2019-04-26 DIAGNOSIS — M5412 Radiculopathy, cervical region: Secondary | ICD-10-CM | POA: Diagnosis not present

## 2019-04-26 DIAGNOSIS — G8929 Other chronic pain: Secondary | ICD-10-CM | POA: Diagnosis not present

## 2019-04-27 DIAGNOSIS — R200 Anesthesia of skin: Secondary | ICD-10-CM | POA: Diagnosis not present

## 2019-05-03 DIAGNOSIS — G8929 Other chronic pain: Secondary | ICD-10-CM | POA: Diagnosis not present

## 2019-05-03 DIAGNOSIS — M5441 Lumbago with sciatica, right side: Secondary | ICD-10-CM | POA: Diagnosis not present

## 2019-05-11 DIAGNOSIS — M25531 Pain in right wrist: Secondary | ICD-10-CM | POA: Diagnosis not present

## 2019-05-11 DIAGNOSIS — R29898 Other symptoms and signs involving the musculoskeletal system: Secondary | ICD-10-CM | POA: Diagnosis not present

## 2019-05-11 DIAGNOSIS — M25521 Pain in right elbow: Secondary | ICD-10-CM | POA: Diagnosis not present

## 2019-05-21 DIAGNOSIS — M47816 Spondylosis without myelopathy or radiculopathy, lumbar region: Secondary | ICD-10-CM | POA: Diagnosis not present

## 2019-05-21 DIAGNOSIS — M5441 Lumbago with sciatica, right side: Secondary | ICD-10-CM | POA: Diagnosis not present

## 2019-05-21 DIAGNOSIS — M7918 Myalgia, other site: Secondary | ICD-10-CM | POA: Diagnosis not present

## 2019-05-21 DIAGNOSIS — M5416 Radiculopathy, lumbar region: Secondary | ICD-10-CM | POA: Diagnosis not present

## 2019-05-27 DIAGNOSIS — R200 Anesthesia of skin: Secondary | ICD-10-CM | POA: Diagnosis not present

## 2019-05-28 DIAGNOSIS — Z1159 Encounter for screening for other viral diseases: Secondary | ICD-10-CM | POA: Diagnosis not present

## 2019-05-28 DIAGNOSIS — R52 Pain, unspecified: Secondary | ICD-10-CM | POA: Diagnosis not present

## 2019-05-28 DIAGNOSIS — J029 Acute pharyngitis, unspecified: Secondary | ICD-10-CM | POA: Diagnosis not present

## 2019-05-28 DIAGNOSIS — Z03818 Encounter for observation for suspected exposure to other biological agents ruled out: Secondary | ICD-10-CM | POA: Diagnosis not present

## 2019-06-01 ENCOUNTER — Other Ambulatory Visit: Payer: Self-pay | Admitting: *Deleted

## 2019-06-01 DIAGNOSIS — E291 Testicular hypofunction: Secondary | ICD-10-CM

## 2019-06-01 MED ORDER — TESTOSTERONE CYPIONATE 200 MG/ML IM SOLN: INTRAMUSCULAR | 0 refills | Status: DC

## 2019-06-08 DIAGNOSIS — M5135 Other intervertebral disc degeneration, thoracolumbar region: Secondary | ICD-10-CM | POA: Diagnosis not present

## 2019-06-08 DIAGNOSIS — M2550 Pain in unspecified joint: Secondary | ICD-10-CM | POA: Diagnosis not present

## 2019-06-08 DIAGNOSIS — M47812 Spondylosis without myelopathy or radiculopathy, cervical region: Secondary | ICD-10-CM | POA: Diagnosis not present

## 2019-06-08 DIAGNOSIS — M5412 Radiculopathy, cervical region: Secondary | ICD-10-CM | POA: Diagnosis not present

## 2019-06-10 DIAGNOSIS — Z9889 Other specified postprocedural states: Secondary | ICD-10-CM | POA: Diagnosis not present

## 2019-06-10 DIAGNOSIS — G8929 Other chronic pain: Secondary | ICD-10-CM | POA: Diagnosis not present

## 2019-06-10 DIAGNOSIS — M5412 Radiculopathy, cervical region: Secondary | ICD-10-CM | POA: Diagnosis not present

## 2019-06-10 DIAGNOSIS — M0880 Other juvenile arthritis, unspecified site: Secondary | ICD-10-CM | POA: Diagnosis not present

## 2019-06-10 DIAGNOSIS — M5441 Lumbago with sciatica, right side: Secondary | ICD-10-CM | POA: Diagnosis not present

## 2019-06-10 DIAGNOSIS — Z79899 Other long term (current) drug therapy: Secondary | ICD-10-CM | POA: Diagnosis not present

## 2019-06-14 DIAGNOSIS — M5135 Other intervertebral disc degeneration, thoracolumbar region: Secondary | ICD-10-CM | POA: Diagnosis not present

## 2019-06-14 DIAGNOSIS — M7918 Myalgia, other site: Secondary | ICD-10-CM | POA: Diagnosis not present

## 2019-06-14 DIAGNOSIS — G8929 Other chronic pain: Secondary | ICD-10-CM | POA: Diagnosis not present

## 2019-06-14 DIAGNOSIS — Z981 Arthrodesis status: Secondary | ICD-10-CM | POA: Diagnosis not present

## 2019-06-14 DIAGNOSIS — Z9889 Other specified postprocedural states: Secondary | ICD-10-CM | POA: Diagnosis not present

## 2019-06-14 DIAGNOSIS — M47812 Spondylosis without myelopathy or radiculopathy, cervical region: Secondary | ICD-10-CM | POA: Diagnosis not present

## 2019-06-18 ENCOUNTER — Other Ambulatory Visit: Payer: Self-pay | Admitting: Sports Medicine

## 2019-06-18 DIAGNOSIS — M79642 Pain in left hand: Secondary | ICD-10-CM

## 2019-06-18 DIAGNOSIS — M7702 Medial epicondylitis, left elbow: Secondary | ICD-10-CM

## 2019-06-18 DIAGNOSIS — M79641 Pain in right hand: Secondary | ICD-10-CM

## 2019-06-18 MED ORDER — NAPROXEN 500 MG PO TABS: 500.0000 mg | ORAL_TABLET | Freq: Two times a day (BID) | ORAL | 3 refills | Status: DC | PRN

## 2019-06-18 MED ORDER — PREGABALIN 100 MG PO CAPS: 100.0000 mg | ORAL_CAPSULE | Freq: Two times a day (BID) | ORAL | 3 refills | Status: DC

## 2019-06-22 DIAGNOSIS — M47812 Spondylosis without myelopathy or radiculopathy, cervical region: Secondary | ICD-10-CM | POA: Diagnosis not present

## 2019-06-22 DIAGNOSIS — E291 Testicular hypofunction: Secondary | ICD-10-CM

## 2019-06-22 NOTE — Telephone Encounter (Signed)
Testosterone lab pended.

## 2019-07-06 DIAGNOSIS — R200 Anesthesia of skin: Secondary | ICD-10-CM | POA: Diagnosis not present

## 2019-07-27 ENCOUNTER — Other Ambulatory Visit: Payer: Self-pay | Admitting: Sports Medicine

## 2019-07-27 MED ORDER — "BD LUER-LOK SYRINGE 22G X 1-1/2"" 3 ML MISC": 1 refills | Status: DC

## 2019-07-28 DIAGNOSIS — M7702 Medial epicondylitis, left elbow: Secondary | ICD-10-CM

## 2019-07-29 DIAGNOSIS — G8929 Other chronic pain: Secondary | ICD-10-CM | POA: Diagnosis not present

## 2019-07-29 DIAGNOSIS — M7918 Myalgia, other site: Secondary | ICD-10-CM | POA: Diagnosis not present

## 2019-07-29 DIAGNOSIS — M533 Sacrococcygeal disorders, not elsewhere classified: Secondary | ICD-10-CM | POA: Diagnosis not present

## 2019-07-29 MED ORDER — HYDROCODONE-ACETAMINOPHEN 5-325 MG PO TABS: 1.0000 | ORAL_TABLET | Freq: Three times a day (TID) | ORAL | 0 refills | Status: DC | PRN

## 2019-08-06 DIAGNOSIS — G5601 Carpal tunnel syndrome, right upper limb: Secondary | ICD-10-CM | POA: Diagnosis not present

## 2019-08-30 ENCOUNTER — Other Ambulatory Visit: Payer: Self-pay | Admitting: Sports Medicine

## 2019-08-30 MED ORDER — "BD HYPODERMIC NEEDLE 18G X 1"" MISC": 1 refills | Status: DC

## 2019-09-03 DIAGNOSIS — L4050 Arthropathic psoriasis, unspecified: Secondary | ICD-10-CM | POA: Diagnosis not present

## 2019-09-03 DIAGNOSIS — Z79899 Other long term (current) drug therapy: Secondary | ICD-10-CM | POA: Diagnosis not present

## 2019-09-03 DIAGNOSIS — M0880 Other juvenile arthritis, unspecified site: Secondary | ICD-10-CM | POA: Diagnosis not present

## 2019-09-09 DIAGNOSIS — M533 Sacrococcygeal disorders, not elsewhere classified: Secondary | ICD-10-CM | POA: Diagnosis not present

## 2019-09-09 DIAGNOSIS — G8929 Other chronic pain: Secondary | ICD-10-CM | POA: Diagnosis not present

## 2019-09-09 DIAGNOSIS — M7918 Myalgia, other site: Secondary | ICD-10-CM | POA: Diagnosis not present

## 2019-09-15 ENCOUNTER — Other Ambulatory Visit: Payer: Self-pay

## 2019-09-15 ENCOUNTER — Ambulatory Visit (INDEPENDENT_AMBULATORY_CARE_PROVIDER_SITE_OTHER): Payer: BC Managed Care – PPO | Admitting: Sports Medicine

## 2019-09-15 DIAGNOSIS — M7552 Bursitis of left shoulder: Secondary | ICD-10-CM | POA: Diagnosis not present

## 2019-09-15 DIAGNOSIS — M25474 Effusion, right foot: Secondary | ICD-10-CM

## 2019-09-15 DIAGNOSIS — G8929 Other chronic pain: Secondary | ICD-10-CM | POA: Insufficient documentation

## 2019-09-15 NOTE — Assessment & Plan Note (Signed)
Impingement symptoms, pain radiating down to the mid deltoid and anterior humerus. Injected today, rehab exercises given, return to see me in a month.

## 2019-09-15 NOTE — Assessment & Plan Note (Signed)
Pain and swelling in the right first MTP with hallux limitus, I do think he be a candidate for custom molded orthotic with a first metatarsal ray post. If insufficient improvement we will proceed with first MTP injection. Referral to Dr. Jordan Likes for custom molded orthotics with first MTP posting.

## 2019-09-15 NOTE — Progress Notes (Addendum)
    Procedures performed today:    Procedure: Real-time Ultrasound Guided injection of the left subacromial bursa Device: Samsung HS60  Verbal informed consent obtained.  Time-out conducted.  Noted no overlying erythema, induration, or other signs of local infection.  Skin prepped in a sterile fashion.  Local anesthesia: Topical Ethyl chloride.  With sterile technique and under real time ultrasound guidance: 1 cc Kenalog 40, 1 cc lidocaine, 1 cc bupivacaine injected easily Completed without difficulty  Pain immediately resolved suggesting accurate placement of the medication.  Advised to call if fevers/chills, erythema, induration, drainage, or persistent bleeding.  Images permanently stored and available for review in the ultrasound unit.  Impression: Technically successful ultrasound guided injection.  Independent interpretation of notes and tests performed by another provider:   None.  Impression and Recommendations:    Subacromial bursitis of left shoulder joint Impingement symptoms, pain radiating down to the mid deltoid and anterior humerus. Injected today, rehab exercises given, return to see me in a month.  Swelling of first metatarsophalangeal (MTP) joint of right foot Pain and swelling in the right first MTP with hallux limitus, I do think he be a candidate for custom molded orthotic with a first metatarsal ray post. If insufficient improvement we will proceed with first MTP injection. Referral to Dr. Jordan Likes for custom molded orthotics with first MTP posting.    ___________________________________________ [name]. Benjamin Stain, M.D., ABFM., CAQSM. Primary Care and Sports Medicine [facility]  Adjunct Instructor of Family Medicine  [employer]

## 2019-09-15 NOTE — Addendum Note (Signed)
Addended by: Monica Becton on: 09/15/2019 03:58 PM   Modules accepted: Orders

## 2019-09-22 ENCOUNTER — Encounter: Payer: BC Managed Care – PPO | Admitting: Family Medicine

## 2019-09-23 DIAGNOSIS — Z872 Personal history of diseases of the skin and subcutaneous tissue: Secondary | ICD-10-CM | POA: Diagnosis not present

## 2019-09-23 DIAGNOSIS — M545 Low back pain: Secondary | ICD-10-CM | POA: Diagnosis not present

## 2019-09-23 DIAGNOSIS — M542 Cervicalgia: Secondary | ICD-10-CM | POA: Diagnosis not present

## 2019-09-23 DIAGNOSIS — G8929 Other chronic pain: Secondary | ICD-10-CM | POA: Diagnosis not present

## 2019-09-29 ENCOUNTER — Ambulatory Visit (INDEPENDENT_AMBULATORY_CARE_PROVIDER_SITE_OTHER): Payer: BC Managed Care – PPO | Admitting: Family Medicine

## 2019-09-29 ENCOUNTER — Other Ambulatory Visit: Payer: Self-pay

## 2019-09-29 ENCOUNTER — Encounter: Payer: Self-pay | Admitting: Family Medicine

## 2019-09-29 DIAGNOSIS — M25474 Effusion, right foot: Secondary | ICD-10-CM | POA: Diagnosis not present

## 2019-09-29 DIAGNOSIS — G5612 Other lesions of median nerve, left upper limb: Secondary | ICD-10-CM | POA: Diagnosis not present

## 2019-09-29 NOTE — Assessment & Plan Note (Signed)
Acute on chronic in nature. Pain associated with 1st MTP joint.  - orthotic  - first ray post on right. May consider scaphoid pad.

## 2019-09-29 NOTE — Progress Notes (Signed)
Shaun Camacho - 34 y.o. male MRN [id number]  Date of birth: [birth year]  SUBJECTIVE:  Including CC & ROS.  Chief Complaint  Patient presents with  . Foot Orthotics    Shaun Camacho is a 34 y.o. male that is presenting with right first toe pain. This is acute on chronic. It is worse at the end of the day. History of injury when he was younger.    Review of Systems See HPI   HISTORY: Past Medical, Surgical, Social, and Family History Reviewed & Updated per EMR.   Pertinent Historical Findings include:  Past Medical History:  Diagnosis Date  . Anxiety and depression 08/23/2016   08/23/2016 PHQ9 = 6, GAD7 = 7 09/12/2016 PHQ9 = 6, GAD7 = 3 10/11/2016 PHQ9 = 1, GAD7 = 1 12/27/2016 PHQ9 = 3, GAD7 = 4 01/24/2017 PHQ9 = 2, GAD7 = 5  . Arthritis   . Complication of anesthesia    when wakes up gets agitated   . GERD (gastroesophageal reflux disease) 12/29/2015  . Male hypogonadism 01/12/2016  . Radiculitis of right cervical region 03/22/2016   MRI from [facility] shows moderate to severe right C6-C7 neuroforaminal stenosis with mass-effect on the exiting right C7 nerve root.    Past Surgical History:  Procedure Laterality Date  . ANTERIOR CERVICAL DECOMP/DISCECTOMY FUSION N/A 07/15/2018   Procedure: ANTERIOR CERVICAL DECOMPRESSION FUSION, CERVICAL FIVE-SIX, CERVICAL SIX-SEVEN WITH INSTRUMENTATION AND ALLOGRAFT;  Surgeon: Estill Bamberg, MD;  Location: [facility] OR;  Service: Orthopedics;  Laterality: N/A;  . LATERAL EPICONDYLE RELEASE Right   . PILONIDAL CYST EXCISION N/A 09/03/2018   Procedure: EXCISION OF PILONIDAL DISEASE;  Surgeon: Karie Soda, MD;  Location: [facility] ORS;  Service: General;  Laterality: N/A;  . SURGERY SCROTAL / TESTICULAR      Family History  Problem Relation Age of Onset  . Thyroid disease Father   . Thyroid disease Sister   . Diabetes Maternal Grandmother     Social History   Socioeconomic History  . Marital status: Married    Spouse name: Not on file  . Number of  children: Not on file  . Years of education: Not on file  . Highest education level: Not on file  Occupational History  . Not on file  Tobacco Use  . Smoking status: Current Every Day Smoker    Packs/day: 0.50    Years: 18.00    Pack years: 9.00  . Smokeless tobacco: Current User    Types: Chew  Substance and Sexual Activity  . Alcohol use: Not on file    Comment: rare  . Drug use: No  . Sexual activity: Not on file  Other Topics Concern  . Not on file  Social History Narrative  . Not on file   Social Determinants of Health   Financial Resource Strain:   . Difficulty of Paying Living Expenses:   Food Insecurity:   . Worried About Programme researcher, broadcasting/film/video in the Last Year:   . Barista in the Last Year:   Transportation Needs:   . Freight forwarder (Medical):   Marland Kitchen Lack of Transportation (Non-Medical):   Physical Activity:   . Days of Exercise per Week:   . Minutes of Exercise per Session:   Stress:   . Feeling of Stress :   Social Connections:   . Frequency of Communication with Friends and Family:   . Frequency of Social Gatherings with Friends and Family:   . Attends Religious Services:   .  Active Member of Clubs or Organizations:   . Attends Archivist Meetings:   Marland Kitchen Marital Status:   Intimate Partner Violence:   . Fear of Current or Ex-Partner:   . Emotionally Abused:   Marland Kitchen Physically Abused:   . Sexually Abused:      PHYSICAL EXAM:  VS: BP 125/85   Ht 5\' 10"  (1.778 m)   Wt 210 lb (95.3 kg)   BMI 30.13 kg/m  Physical Exam Gen: NAD, alert, cooperative with exam, well-appearing MSK:  Right foot:  Pes cavus  Limited flexion and extension of great toe  Normal ROM  Normal strength to resistance.  NVI    Patient was fitted for a standard, cushioned, semi-rigid orthotic. The orthotic was heated and afterward the patient stood on the orthotic blank positioned on the orthotic stand. The patient was positioned in subtalar neutral position  and 10 degrees of ankle dorsiflexion in a weight bearing stance. After completion of molding, a stable base was applied to the orthotic blank. The blank was ground to a stable position for weight bearing. Size: 10 Pairs: 2 Base: Blue EVA Additional Posting and Padding: First ray post on right  The patient ambulated these, and they were very comfortable.  ASSESSMENT & PLAN:   Swelling of first metatarsophalangeal (MTP) joint of right foot Acute on chronic in nature. Pain associated with 1st MTP joint.  - orthotic  - first ray post on right. May consider scaphoid pad.

## 2019-10-05 ENCOUNTER — Other Ambulatory Visit: Payer: Self-pay | Admitting: *Deleted

## 2019-10-05 MED ORDER — OMEPRAZOLE 40 MG PO CPDR: 40.0000 mg | DELAYED_RELEASE_CAPSULE | Freq: Every day | ORAL | 3 refills | Status: DC

## 2019-10-11 DIAGNOSIS — G5612 Other lesions of median nerve, left upper limb: Secondary | ICD-10-CM | POA: Diagnosis not present

## 2019-10-13 ENCOUNTER — Other Ambulatory Visit: Payer: Self-pay

## 2019-10-13 ENCOUNTER — Ambulatory Visit (INDEPENDENT_AMBULATORY_CARE_PROVIDER_SITE_OTHER): Payer: BC Managed Care – PPO | Admitting: Sports Medicine

## 2019-10-13 DIAGNOSIS — G5612 Other lesions of median nerve, left upper limb: Secondary | ICD-10-CM | POA: Diagnosis not present

## 2019-10-13 DIAGNOSIS — M79601 Pain in right arm: Secondary | ICD-10-CM

## 2019-10-13 DIAGNOSIS — G5622 Lesion of ulnar nerve, left upper limb: Secondary | ICD-10-CM

## 2019-10-13 DIAGNOSIS — M7552 Bursitis of left shoulder: Secondary | ICD-10-CM | POA: Diagnosis not present

## 2019-10-13 NOTE — Progress Notes (Signed)
    Procedures performed today:    None.  Independent interpretation of notes and tests performed by another provider:   None.  Brief History, Exam, Impression, and Recommendations:    Right arm pain Shaun Camacho for 1 week has had a burning sensation on the back of his right tricep, shoulder exam is unremarkable. I cannot find any areas of tenderness. I think this is just a tricep strain from working out, and trace of rehab exercises, he can return to see me if this is no better in a month.  Subacromial bursitis of left shoulder joint At the last visit we did a subacromial injection, rehab exercises, he returns to see me pain-free.  Cubital tunnel syndrome, left Shaun Camacho just had a cubital tunnel ulnar nerve hydrodissection with Dr. Althea Charon at [facility] orthopedics. We will give this a month before further evaluating.    ___________________________________________ Shaun Camacho. Benjamin Stain, M.D., ABFM., CAQSM. Primary Care and Sports Medicine [facility]  Adjunct Instructor of Family Medicine  [employer]

## 2019-10-13 NOTE — Assessment & Plan Note (Signed)
At the last visit we did a subacromial injection, rehab exercises, he returns to see me pain-free.

## 2019-10-13 NOTE — Assessment & Plan Note (Signed)
Shaun Camacho for 1 week has had a burning sensation on the back of his right tricep, shoulder exam is unremarkable. I cannot find any areas of tenderness. I think this is just a tricep strain from working out, and trace of rehab exercises, he can return to see me if this is no better in a month.

## 2019-10-13 NOTE — Assessment & Plan Note (Signed)
Le just had a cubital tunnel ulnar nerve hydrodissection with Dr. Althea Charon at [facility] orthopedics. We will give this a month before further evaluating.

## 2019-10-20 ENCOUNTER — Other Ambulatory Visit: Payer: Self-pay

## 2019-10-20 ENCOUNTER — Ambulatory Visit (INDEPENDENT_AMBULATORY_CARE_PROVIDER_SITE_OTHER): Payer: BC Managed Care – PPO | Admitting: Sports Medicine

## 2019-10-20 DIAGNOSIS — R200 Anesthesia of skin: Secondary | ICD-10-CM

## 2019-10-20 DIAGNOSIS — M79642 Pain in left hand: Secondary | ICD-10-CM

## 2019-10-20 DIAGNOSIS — R202 Paresthesia of skin: Secondary | ICD-10-CM | POA: Diagnosis not present

## 2019-10-20 DIAGNOSIS — M7702 Medial epicondylitis, left elbow: Secondary | ICD-10-CM | POA: Diagnosis not present

## 2019-10-20 DIAGNOSIS — M79641 Pain in right hand: Secondary | ICD-10-CM

## 2019-10-20 MED ORDER — PREGABALIN 150 MG PO CAPS: 150.0000 mg | ORAL_CAPSULE | Freq: Two times a day (BID) | ORAL | 3 refills | Status: DC

## 2019-10-20 MED ORDER — ALPHA-LIPOIC ACID 600 MG PO CAPS: 1.0000 | ORAL_CAPSULE | Freq: Every day | ORAL | 11 refills | Status: DC

## 2019-10-20 MED ORDER — PYRIDOXINE HCL 25 MG PO TABS: 25.0000 mg | ORAL_TABLET | Freq: Every day | ORAL | 3 refills | Status: DC

## 2019-10-20 NOTE — Addendum Note (Signed)
Addended by: Monica Becton on: 10/20/2019 10:27 AM   Modules accepted: Orders

## 2019-10-20 NOTE — Assessment & Plan Note (Addendum)
Shaun Camacho continues to have chronic numbness and tingling in the upper and lower extremities. He has had nerve conduction and EMG studies that did not show any evidence of a peripheral neuropathy. He does have significant cervical spine disease, cubital tunnel syndrome, suspected carpal tunnel syndrome, lumbar degenerative disease. I think his paresthesias are all residual from more proximal compressive neuropathies. He is currently taking Lyrica, we are going to go ahead and increase the dose. I did explain to him that the treatment for peripheral neuropathy was ultimately still medication and reassurance. In the meantime I am also going to add alpha lipoic acid, check vitamin B12 levels (last normal in 2016), and add vitamin B6.  B12 levels are in the normal range but at the bottom of the lower normal range, please add methylmalonic acid to blood already in the lab, orders placed.

## 2019-10-20 NOTE — Progress Notes (Addendum)
    Procedures performed today:    Procedure: Real-time Ultrasound Guided injection of the left common flexor tendon origin at the medial epicondyle Device: Samsung HS60  Verbal informed consent obtained.  Time-out conducted.  Noted no overlying erythema, induration, or other signs of local infection.  Skin prepped in a sterile fashion.  Local anesthesia: Topical Ethyl chloride.  With sterile technique and under real time ultrasound guidance: 1 cc Kenalog 40, 1 cc lidocaine,1 cc bupivacaine injected easily Completed without difficulty  Pain immediately resolved suggesting accurate placement of the medication.  Advised to call if fevers/chills, erythema, induration, drainage, or persistent bleeding.  Images permanently stored and available for review in the ultrasound unit.  Impression: Technically successful ultrasound guided injection.  Independent interpretation of notes and tests performed by another provider:   None.  Brief History, Exam, Impression, and Recommendations:    Medial epicondylitis, left Chet returns, he is having recurrence of medial elbow pain on the left side, he does have a known history of left medial epicondylitis, noted on MRI. He did recently have a cubital tunnel injection with Dr. Althea Charon at [facility] orthopedics earlier in the month. He has not noted any relief yet from his cubital tunnel injection. Proceeding with a common flexor tendon injection today. Return in 1 month for this.  Numbness and tingling of upper and lower extremities of both sides Reinaldo continues to have chronic numbness and tingling in the upper and lower extremities. He has had nerve conduction and EMG studies that did not show any evidence of a peripheral neuropathy. He does have significant cervical spine disease, cubital tunnel syndrome, suspected carpal tunnel syndrome, lumbar degenerative disease. I think his paresthesias are all residual from more proximal compressive  neuropathies. He is currently taking Lyrica, we are going to go ahead and increase the dose. I did explain to him that the treatment for peripheral neuropathy was ultimately still medication and reassurance. In the meantime I am also going to add alpha lipoic acid, check vitamin B12 levels (last normal in 2016), and add vitamin B6.  B12 levels are in the normal range but at the bottom of the lower normal range, please add methylmalonic acid to blood already in the lab, orders placed.    ___________________________________________ [name]. Benjamin Stain, M.D., ABFM., CAQSM. Primary Care and Sports Medicine [facility]  Adjunct Instructor of Family Medicine  [employer]

## 2019-10-20 NOTE — Assessment & Plan Note (Signed)
Shaun Camacho returns, he is having recurrence of medial elbow pain on the left side, he does have a known history of left medial epicondylitis, noted on MRI. He did recently have a cubital tunnel injection with Dr. Althea Charon at [facility] orthopedics earlier in the month. He has not noted any relief yet from his cubital tunnel injection. Proceeding with a common flexor tendon injection today. Return in 1 month for this.

## 2019-10-21 NOTE — Addendum Note (Signed)
Addended by: Monica Becton on: 10/21/2019 10:27 AM   Modules accepted: Orders

## 2019-10-22 DIAGNOSIS — H9313 Tinnitus, bilateral: Secondary | ICD-10-CM

## 2019-10-22 DIAGNOSIS — H9319 Tinnitus, unspecified ear: Secondary | ICD-10-CM | POA: Insufficient documentation

## 2019-10-22 NOTE — Telephone Encounter (Signed)
Routing to provider  

## 2019-10-25 ENCOUNTER — Other Ambulatory Visit: Payer: Self-pay | Admitting: Sports Medicine

## 2019-10-25 DIAGNOSIS — E291 Testicular hypofunction: Secondary | ICD-10-CM

## 2019-10-25 MED ORDER — OMEPRAZOLE 40 MG PO CPDR: 40.0000 mg | DELAYED_RELEASE_CAPSULE | Freq: Two times a day (BID) | ORAL | 3 refills | Status: DC

## 2019-10-26 DIAGNOSIS — M546 Pain in thoracic spine: Secondary | ICD-10-CM | POA: Diagnosis not present

## 2019-10-26 DIAGNOSIS — M47816 Spondylosis without myelopathy or radiculopathy, lumbar region: Secondary | ICD-10-CM | POA: Diagnosis not present

## 2019-10-26 DIAGNOSIS — M545 Low back pain: Secondary | ICD-10-CM | POA: Diagnosis not present

## 2019-10-26 DIAGNOSIS — G8929 Other chronic pain: Secondary | ICD-10-CM | POA: Diagnosis not present

## 2019-10-28 DIAGNOSIS — M79641 Pain in right hand: Secondary | ICD-10-CM

## 2019-10-28 DIAGNOSIS — M79642 Pain in left hand: Secondary | ICD-10-CM

## 2019-10-29 MED ORDER — PREGABALIN 200 MG PO CAPS: 200.0000 mg | ORAL_CAPSULE | Freq: Two times a day (BID) | ORAL | 3 refills | Status: DC

## 2019-10-31 LAB — TEST AUTHORIZATION

## 2019-10-31 LAB — VITAMIN B12: Vitamin B-12: 360 pg/mL (ref 200–1100)

## 2019-10-31 LAB — METHYLMALONIC ACID, SERUM: Methylmalonic Acid, Quant: 77 nmol/L — ABNORMAL LOW (ref 87–318)

## 2019-11-02 DIAGNOSIS — G8929 Other chronic pain: Secondary | ICD-10-CM | POA: Diagnosis not present

## 2019-11-02 DIAGNOSIS — M47816 Spondylosis without myelopathy or radiculopathy, lumbar region: Secondary | ICD-10-CM | POA: Diagnosis not present

## 2019-11-04 DIAGNOSIS — R200 Anesthesia of skin: Secondary | ICD-10-CM | POA: Diagnosis not present

## 2019-11-04 DIAGNOSIS — G5612 Other lesions of median nerve, left upper limb: Secondary | ICD-10-CM | POA: Diagnosis not present

## 2019-11-09 DIAGNOSIS — H903 Sensorineural hearing loss, bilateral: Secondary | ICD-10-CM | POA: Diagnosis not present

## 2019-11-09 DIAGNOSIS — H9313 Tinnitus, bilateral: Secondary | ICD-10-CM | POA: Diagnosis not present

## 2019-11-09 NOTE — Telephone Encounter (Signed)
This should probably go to medical records.

## 2019-11-10 ENCOUNTER — Other Ambulatory Visit: Payer: Self-pay

## 2019-11-10 ENCOUNTER — Ambulatory Visit (INDEPENDENT_AMBULATORY_CARE_PROVIDER_SITE_OTHER): Payer: BC Managed Care – PPO | Admitting: Sports Medicine

## 2019-11-10 DIAGNOSIS — G5603 Carpal tunnel syndrome, bilateral upper limbs: Secondary | ICD-10-CM | POA: Diagnosis not present

## 2019-11-10 DIAGNOSIS — M7702 Medial epicondylitis, left elbow: Secondary | ICD-10-CM | POA: Diagnosis not present

## 2019-11-10 DIAGNOSIS — L309 Dermatitis, unspecified: Secondary | ICD-10-CM | POA: Insufficient documentation

## 2019-11-10 DIAGNOSIS — R200 Anesthesia of skin: Secondary | ICD-10-CM | POA: Diagnosis not present

## 2019-11-10 DIAGNOSIS — R202 Paresthesia of skin: Secondary | ICD-10-CM

## 2019-11-10 DIAGNOSIS — G4700 Insomnia, unspecified: Secondary | ICD-10-CM

## 2019-11-10 DIAGNOSIS — G4733 Obstructive sleep apnea (adult) (pediatric): Secondary | ICD-10-CM | POA: Insufficient documentation

## 2019-11-10 DIAGNOSIS — Z0000 Encounter for general adult medical examination without abnormal findings: Secondary | ICD-10-CM

## 2019-11-10 DIAGNOSIS — G5622 Lesion of ulnar nerve, left upper limb: Secondary | ICD-10-CM | POA: Diagnosis not present

## 2019-11-10 MED ORDER — CLOBETASOL PROPIONATE 0.05 % EX CREA: 1.0000 "application " | TOPICAL_CREAM | Freq: Two times a day (BID) | CUTANEOUS | 2 refills | Status: DC

## 2019-11-10 MED ORDER — PREGABALIN 300 MG PO CAPS: 300.0000 mg | ORAL_CAPSULE | Freq: Two times a day (BID) | ORAL | 3 refills | Status: DC

## 2019-11-10 NOTE — Assessment & Plan Note (Signed)
Notes chronic dermatitis of the fingertips. Adding clobetasol 0.05% cream, we can bump up the potency if insufficient improvement.

## 2019-11-10 NOTE — Assessment & Plan Note (Signed)
Wound continues to have chronic numbness and tingling in the upper and lower extremities, he had a nerve conduction and EMG that did not show any evidence of a peripheral neuropathy, but he does have significant cervical spine disease, cubital tunnel syndrome, carpal tunnel syndrome, and lumbar DDD. My suspicion was that his paresthesias are all residual from more proximal compressive neuropathies, we have increased his Lyrica several times, I am going to go ahead and go up to maximum dose of Lyrica now. I have also done a serum work-up including B12 levels, he is taking alpha lipoic acid and vitamin B6, nothing is helping. We have done a heavy metal work-up in the past, all normal. Autoimmune/rheumatoid work-up has been unremarkable in the recent past. I do not see a role for neurology consultation at this point. I did explain to him that he may need to see complementary alternative medicine treatment at this point as well.

## 2019-11-10 NOTE — Assessment & Plan Note (Signed)
Shaun Camacho returns, he had a cubital tunnel ulnar nerve hydrodissection by Dr. Althea Charon, no improvement, it sounds like Dr. Janee Morn is planning a ulnar nerve transposition surgery.

## 2019-11-10 NOTE — Assessment & Plan Note (Signed)
Known bilateral carpal tunnel syndrome, we have done several Hydro dissections, it also sounds like Dr. Janee Morn is planning carpal tunnel release.

## 2019-11-10 NOTE — Progress Notes (Signed)
    Procedures performed today:    None.  Independent interpretation of notes and tests performed by another provider:   None.  Brief History, Exam, Impression, and Recommendations:    Cubital tunnel syndrome, left Shaun Camacho returns, he had a cubital tunnel ulnar nerve hydrodissection by Dr. Althea Charon, no improvement, it sounds like Dr. Janee Morn is planning a ulnar nerve transposition surgery.  Carpal tunnel syndrome Known bilateral carpal tunnel syndrome, we have done several Hydro dissections, it also sounds like Dr. Janee Morn is planning carpal tunnel release.  Medial epicondylitis, left No improvement after injection at the last visit, he did have medial epicondylitis noted on MRI, he also had a cubital tunnel injection with Dr. Althea Charon back in April.  Numbness and tingling of upper and lower extremities of both sides Wound continues to have chronic numbness and tingling in the upper and lower extremities, he had a nerve conduction and EMG that did not show any evidence of a peripheral neuropathy, but he does have significant cervical spine disease, cubital tunnel syndrome, carpal tunnel syndrome, and lumbar DDD. My suspicion was that his paresthesias are all residual from more proximal compressive neuropathies, we have increased his Lyrica several times, I am going to go ahead and go up to maximum dose of Lyrica now. I have also done a serum work-up including B12 levels, he is taking alpha lipoic acid and vitamin B6, nothing is helping. We have done a heavy metal work-up in the past, all normal. Autoimmune/rheumatoid work-up has been unremarkable in the recent past. I do not see a role for neurology consultation at this point. I did explain to him that he may need to see complementary alternative medicine treatment at this point as well.  Annual physical exam [employer] retirement plan disability forms filled out today.  Chronic dermatitis of hands Notes chronic dermatitis of  the fingertips. Adding clobetasol 0.05% cream, we can bump up the potency if insufficient improvement.  Insomnia disorder Unspecified type. Wakes up multiple times through the night, most likely related to his orthopedic symptoms. Only a single episode of nocturia. Adding home sleep study, we may try insomnia medications in the future such as Belsomra and Dayvigo.  I spent 30 minutes of total time managing this patient today, this includes chart review, face to face, and non-face to face time.   ___________________________________________ Shaun Camacho. Shaun Camacho, M.D., ABFM., CAQSM. Primary Care and Sports Medicine [place] [facility]  Adjunct Instructor of Family Medicine  [employer]

## 2019-11-10 NOTE — Assessment & Plan Note (Signed)
Unspecified type. Wakes up multiple times through the night, most likely related to his orthopedic symptoms. Only a single episode of nocturia. Adding home sleep study, we may try insomnia medications in the future such as Belsomra and Dayvigo.

## 2019-11-10 NOTE — Assessment & Plan Note (Signed)
No improvement after injection at the last visit, he did have medial epicondylitis noted on MRI, he also had a cubital tunnel injection with Dr. Althea Charon back in April.

## 2019-11-10 NOTE — Assessment & Plan Note (Signed)
[employer] retirement plan disability forms filled out today.

## 2019-11-16 DIAGNOSIS — M47816 Spondylosis without myelopathy or radiculopathy, lumbar region: Secondary | ICD-10-CM | POA: Diagnosis not present

## 2019-11-18 ENCOUNTER — Other Ambulatory Visit: Payer: Self-pay

## 2019-11-18 ENCOUNTER — Encounter: Payer: Self-pay | Admitting: Family Medicine

## 2019-11-18 ENCOUNTER — Ambulatory Visit (INDEPENDENT_AMBULATORY_CARE_PROVIDER_SITE_OTHER): Payer: BC Managed Care – PPO | Admitting: Family Medicine

## 2019-11-18 VITALS — BP 133/82 | HR 89

## 2019-11-18 DIAGNOSIS — R339 Retention of urine, unspecified: Secondary | ICD-10-CM

## 2019-11-18 DIAGNOSIS — R309 Painful micturition, unspecified: Secondary | ICD-10-CM | POA: Insufficient documentation

## 2019-11-18 LAB — POCT URINALYSIS DIP (CLINITEK)
Bilirubin, UA: NEGATIVE
Blood, UA: NEGATIVE
Glucose, UA: NEGATIVE mg/dL
Ketones, POC UA: NEGATIVE mg/dL
Leukocytes, UA: NEGATIVE
Nitrite, UA: NEGATIVE
POC PROTEIN,UA: NEGATIVE
Spec Grav, UA: 1.025 (ref 1.010–1.025)
Urobilinogen, UA: 0.2 E.U./dL
pH, UA: 7 (ref 5.0–8.0)

## 2019-11-18 MED ORDER — TAMSULOSIN HCL 0.4 MG PO CAPS: 0.4000 mg | ORAL_CAPSULE | Freq: Every day | ORAL | 3 refills | Status: DC

## 2019-11-18 NOTE — Assessment & Plan Note (Signed)
Mildly enlarged prostate on rectal exam without palpable nodules or significant tenderness.  Doubt prostatitis given absence of pain or other systemic symptoms.  UA normal.  Check PSA Start flomax.   If not having significant relief with this we may need to have him see urology and/or back down on testosterone dosing.

## 2019-11-18 NOTE — Progress Notes (Signed)
Shaun Camacho - 34 y.o. male MRN [id number]  Date of birth: Feb 11, 1986  Subjective Chief Complaint  Patient presents with  . urinary issues    HPI Shaun Camacho is a 34 y.o. male with history of hypogonadism here today with complaint of urinary symptoms.  Reports that he has had trouble starting stream with feeling of incomplete emptying.  He denies pain with urination, groin or perineal pain, fever, or chills.  ROS:  A comprehensive ROS was completed and negative except as noted per HPI   Allergies  Allergen Reactions  . Tramadol Palpitations  . Meloxicam Other (See Comments)    Somnolence    Past Medical History:  Diagnosis Date  . Anxiety and depression 08/23/2016   08/23/2016 PHQ9 = 6, GAD7 = 7 09/12/2016 PHQ9 = 6, GAD7 = 3 10/11/2016 PHQ9 = 1, GAD7 = 1 12/27/2016 PHQ9 = 3, GAD7 = 4 01/24/2017 PHQ9 = 2, GAD7 = 5  . Arthritis   . Complication of anesthesia    when wakes up gets agitated   . GERD (gastroesophageal reflux disease) 12/29/2015  . Male hypogonadism 01/12/2016  . Radiculitis of right cervical region 03/22/2016   MRI from [facility] shows moderate to severe right C6-C7 neuroforaminal stenosis with mass-effect on the exiting right C7 nerve root.    Past Surgical History:  Procedure Laterality Date  . ANTERIOR CERVICAL DECOMP/DISCECTOMY FUSION N/A 07/15/2018   Procedure: ANTERIOR CERVICAL DECOMPRESSION FUSION, CERVICAL FIVE-SIX, CERVICAL SIX-SEVEN WITH INSTRUMENTATION AND ALLOGRAFT;  Surgeon: Estill Bamberg, MD;  Location: [facility] OR;  Service: Orthopedics;  Laterality: N/A;  . LATERAL EPICONDYLE RELEASE Right   . PILONIDAL CYST EXCISION N/A 09/03/2018   Procedure: EXCISION OF PILONIDAL DISEASE;  Surgeon: Karie Soda, MD;  Location: [facility] ORS;  Service: General;  Laterality: N/A;  . SURGERY SCROTAL / TESTICULAR      Social History   Socioeconomic History  . Marital status: Married    Spouse name: Not on file  . Number of children: Not on file  . Years of  education: Not on file  . Highest education level: Not on file  Occupational History  . Not on file  Tobacco Use  . Smoking status: Current Every Day Smoker    Packs/day: 0.50    Years: 18.00    Pack years: 9.00  . Smokeless tobacco: Current User    Types: Chew  Substance and Sexual Activity  . Alcohol use: Not on file    Comment: rare  . Drug use: No  . Sexual activity: Not on file  Other Topics Concern  . Not on file  Social History Narrative  . Not on file   Social Determinants of Health   Financial Resource Strain:   . Difficulty of Paying Living Expenses:   Food Insecurity:   . Worried About Programme researcher, broadcasting/film/video in the Last Year:   . Barista in the Last Year:   Transportation Needs:   . Freight forwarder (Medical):   Marland Kitchen Lack of Transportation (Non-Medical):   Physical Activity:   . Days of Exercise per Week:   . Minutes of Exercise per Session:   Stress:   . Feeling of Stress :   Social Connections:   . Frequency of Communication with Friends and Family:   . Frequency of Social Gatherings with Friends and Family:   . Attends Religious Services:   . Active Member of Clubs or Organizations:   . Attends Banker Meetings:   .  Marital Status:     Family History  Problem Relation Age of Onset  . Thyroid disease Father   . Thyroid disease Sister   . Diabetes Maternal Grandmother     Health Maintenance  Topic Date Due  . HIV Screening  Never done  . COVID-19 Vaccine (1) Never done  . INFLUENZA VACCINE  02/06/2020  . TETANUS/TDAP  02/12/2023     ----------------------------------------------------------------------------------------------------------------------------------------------------------------------------------------------------------------- Physical Exam There were no vitals taken for this visit.  Physical Exam Constitutional:      Appearance: Normal appearance.  HENT:     Head: Normocephalic and atraumatic.  Eyes:      General: No scleral icterus. Cardiovascular:     Rate and Rhythm: Normal rate and regular rhythm.  Pulmonary:     Effort: Pulmonary effort is normal.     Breath sounds: Normal breath sounds.  Genitourinary:    Rectum: Normal.     Comments: Mildly enlarged prostate exam without significant tenderness.  Musculoskeletal:     Cervical back: Neck supple.  Neurological:     General: No focal deficit present.     Mental Status: He is alert.  Psychiatric:        Mood and Affect: Mood normal.        Behavior: Behavior normal.     ------------------------------------------------------------------------------------------------------------------------------------------------------------------------------------------------------------------- Assessment and Plan  Urinary retention Mildly enlarged prostate on rectal exam without palpable nodules or significant tenderness.  Doubt prostatitis given absence of pain or other systemic symptoms.  UA normal.  Check PSA Start flomax.   If not having significant relief with this we may need to have him see urology and/or back down on testosterone dosing.     Meds ordered this encounter  Medications  . tamsulosin (FLOMAX) 0.4 MG CAPS capsule    Sig: Take 1 capsule (0.4 mg total) by mouth daily.    Dispense:  30 capsule    Refill:  3    No follow-ups on file.    This visit occurred during the SARS-CoV-2 public health emergency.  Safety protocols were in place, including screening questions prior to the visit, additional usage of staff PPE, and extensive cleaning of exam room while observing appropriate contact time as indicated for disinfecting solutions.

## 2019-11-18 NOTE — Patient Instructions (Signed)
Have PSA checked at lab downstairs.  Urine is normal.  Start tamsulosin daily to help with urinary flow.  If not improving with this let us know.

## 2019-11-19 LAB — PSA: PSA: 0.6 ng/mL (ref <=4.0)

## 2019-11-22 DIAGNOSIS — R339 Retention of urine, unspecified: Secondary | ICD-10-CM

## 2019-11-23 DIAGNOSIS — M792 Neuralgia and neuritis, unspecified: Secondary | ICD-10-CM | POA: Diagnosis not present

## 2019-11-23 DIAGNOSIS — M545 Low back pain: Secondary | ICD-10-CM | POA: Diagnosis not present

## 2019-11-23 DIAGNOSIS — M546 Pain in thoracic spine: Secondary | ICD-10-CM | POA: Diagnosis not present

## 2019-11-23 DIAGNOSIS — M47816 Spondylosis without myelopathy or radiculopathy, lumbar region: Secondary | ICD-10-CM | POA: Diagnosis not present

## 2019-11-23 DIAGNOSIS — R339 Retention of urine, unspecified: Secondary | ICD-10-CM

## 2019-11-23 MED ORDER — TADALAFIL 5 MG PO TABS: 5.0000 mg | ORAL_TABLET | Freq: Every day | ORAL | 11 refills | Status: DC

## 2019-11-23 NOTE — Assessment & Plan Note (Signed)
Shaun Camacho recently saw Dr. Ashley Royalty with regards to urinary retention, he did have some enlarged prostate on exam. He was started on Flomax which the patient is now telling me is giving him some stomach trouble, switching to tadalafil 5 mg daily with a good Rx coupon sent to the [employer] near his house. If this does not work he will need to follow-up with me for further evaluation. We would probably get a bladder scan/postvoid residuals, most likely through urology at that point.

## 2019-11-24 MED ORDER — CIPROFLOXACIN HCL 750 MG PO TABS: 750.0000 mg | ORAL_TABLET | Freq: Two times a day (BID) | ORAL | 0 refills | Status: AC

## 2019-11-24 NOTE — Assessment & Plan Note (Signed)
Enlarged prostate on exam with Dr. Ashley Royalty, did not tolerate Flomax, switched to tadalafil, starting to have some chills, I am going to go ahead and add a prescription for ciprofloxacin for suspected prostatitis.

## 2019-11-25 DIAGNOSIS — G5612 Other lesions of median nerve, left upper limb: Secondary | ICD-10-CM | POA: Diagnosis not present

## 2019-12-02 DIAGNOSIS — G4733 Obstructive sleep apnea (adult) (pediatric): Secondary | ICD-10-CM

## 2019-12-03 ENCOUNTER — Encounter: Payer: Self-pay | Admitting: Sports Medicine

## 2019-12-08 ENCOUNTER — Telehealth: Payer: Self-pay

## 2019-12-08 NOTE — Telephone Encounter (Signed)
Pt called stating that he was unaware of cipro RX being sent to pharmacy on 11/24/2019 until they called him today to see if he still wanted this RX.  Pt wanted to know if he needed to take this medication.  Advised pt that this medication was prescribed for suspected prostatitis and that if he is still having problems, he should take this prescription.  Pt stated that he is having surgery on 12/13/2019 for carpal tunnel and wants to ensure that this medication is okay to take prior to surgery.  Please advise.  Tiajuana Amass, CMA

## 2019-12-08 NOTE — Telephone Encounter (Signed)
Yes, he should take it and it is okay for surgery.

## 2019-12-09 NOTE — Telephone Encounter (Signed)
Patient advised. Will start medication.

## 2019-12-13 DIAGNOSIS — G5602 Carpal tunnel syndrome, left upper limb: Secondary | ICD-10-CM | POA: Diagnosis not present

## 2019-12-23 DIAGNOSIS — Z79899 Other long term (current) drug therapy: Secondary | ICD-10-CM | POA: Diagnosis not present

## 2019-12-23 DIAGNOSIS — M0880 Other juvenile arthritis, unspecified site: Secondary | ICD-10-CM | POA: Diagnosis not present

## 2019-12-23 DIAGNOSIS — G5602 Carpal tunnel syndrome, left upper limb: Secondary | ICD-10-CM | POA: Diagnosis not present

## 2019-12-23 DIAGNOSIS — L4050 Arthropathic psoriasis, unspecified: Secondary | ICD-10-CM | POA: Diagnosis not present

## 2020-01-05 ENCOUNTER — Ambulatory Visit (INDEPENDENT_AMBULATORY_CARE_PROVIDER_SITE_OTHER): Payer: BC Managed Care – PPO | Admitting: Sports Medicine

## 2020-01-05 ENCOUNTER — Encounter: Payer: Self-pay | Admitting: Sports Medicine

## 2020-01-05 ENCOUNTER — Ambulatory Visit (INDEPENDENT_AMBULATORY_CARE_PROVIDER_SITE_OTHER): Payer: BC Managed Care – PPO

## 2020-01-05 ENCOUNTER — Other Ambulatory Visit: Payer: Self-pay

## 2020-01-05 DIAGNOSIS — M47816 Spondylosis without myelopathy or radiculopathy, lumbar region: Secondary | ICD-10-CM | POA: Diagnosis not present

## 2020-01-05 DIAGNOSIS — Z981 Arthrodesis status: Secondary | ICD-10-CM

## 2020-01-05 MED ORDER — AMITRIPTYLINE HCL 50 MG PO TABS: ORAL_TABLET | ORAL | 3 refills | Status: DC

## 2020-01-05 NOTE — Assessment & Plan Note (Signed)
Shaun Camacho returns, he has had a C5-C7 ACDF, treatment has been with Dr. Estill Bamberg. Unfortunately continues to have worsening pain in his neck, as well as some radiation down into his little finger. Nerve conduction and EMG was unremarkable. Because he has had acute onset worsening of pain we are going to get an updated set of x-rays and an updated MRI. It sounds as though there is also some discussion of ulnar nerve transposition. If I not see clear evidence of left C8 nerve root compression on the MRI I do think he should proceed with ulnar nerve transposition. Continue high-dose Lyrica, adding amitriptyline.

## 2020-01-05 NOTE — Progress Notes (Signed)
    Procedures performed today:    None.  Independent interpretation of notes and tests performed by another provider:   None.  Brief History, Exam, Impression, and Recommendations:    History of fusion of cervical spine Boss returns, he has had a C5-C7 ACDF, treatment has been with Dr. Estill Bamberg. Unfortunately continues to have worsening pain in his neck, as well as some radiation down into his little finger. Nerve conduction and EMG was unremarkable. Because he has had acute onset worsening of pain we are going to get an updated set of x-rays and an updated MRI. It sounds as though there is also some discussion of ulnar nerve transposition. If I not see clear evidence of left C8 nerve root compression on the MRI I do think he should proceed with ulnar nerve transposition. Continue high-dose Lyrica, adding amitriptyline.    ___________________________________________ [name]. Benjamin Stain, M.D., ABFM., CAQSM. Primary Care and Sports Medicine [facility]  Adjunct Instructor of Family Medicine  [employer]

## 2020-01-12 ENCOUNTER — Telehealth: Payer: Self-pay | Admitting: Sports Medicine

## 2020-01-12 NOTE — Telephone Encounter (Signed)
Received fax for PA on Testosterone sent through cover my meds and received Authorization.   Case ID: [id number] Valid: 01/06/2020 - 01/11/2021. - CF

## 2020-01-24 ENCOUNTER — Telehealth: Payer: Self-pay | Admitting: Sports Medicine

## 2020-01-24 DIAGNOSIS — K219 Gastro-esophageal reflux disease without esophagitis: Secondary | ICD-10-CM

## 2020-01-24 NOTE — Assessment & Plan Note (Signed)
Symptoms historically have improved with PPIs, on a recent cervical spine MRI we did see some nonspecific esophageal wall thickening, the recommendation was gastroenterology referral which will be placed now.

## 2020-01-24 NOTE — Telephone Encounter (Signed)
Patient advised of results and recommendations.  

## 2020-01-24 NOTE — Telephone Encounter (Addendum)
MRI of the cervical spine from [facility] received, there is a similar appearance from prior MRIs with mild foraminal narrowing, not significantly changed, there is no further central canal narrowing, there is some thickening of the esophageal wall, it is recommended that we proceed with gastroenterology referral. This order will be placed. Any further evaluation of the cervical spine should be with his surgeon.  Please call and let patient know.

## 2020-01-25 ENCOUNTER — Encounter: Payer: Self-pay | Admitting: Gastroenterology

## 2020-02-04 ENCOUNTER — Other Ambulatory Visit: Payer: Self-pay

## 2020-02-04 ENCOUNTER — Encounter: Payer: Self-pay | Admitting: Sports Medicine

## 2020-02-04 ENCOUNTER — Ambulatory Visit: Payer: BC Managed Care – PPO | Admitting: Sports Medicine

## 2020-02-04 DIAGNOSIS — M7702 Medial epicondylitis, left elbow: Secondary | ICD-10-CM | POA: Diagnosis not present

## 2020-02-04 DIAGNOSIS — M5135 Other intervertebral disc degeneration, thoracolumbar region: Secondary | ICD-10-CM

## 2020-02-04 DIAGNOSIS — G5622 Lesion of ulnar nerve, left upper limb: Secondary | ICD-10-CM | POA: Diagnosis not present

## 2020-02-04 DIAGNOSIS — R200 Anesthesia of skin: Secondary | ICD-10-CM | POA: Diagnosis not present

## 2020-02-04 DIAGNOSIS — M25474 Effusion, right foot: Secondary | ICD-10-CM | POA: Diagnosis not present

## 2020-02-04 DIAGNOSIS — R202 Paresthesia of skin: Secondary | ICD-10-CM

## 2020-02-04 DIAGNOSIS — R339 Retention of urine, unspecified: Secondary | ICD-10-CM

## 2020-02-04 NOTE — Assessment & Plan Note (Addendum)
Shaun Camacho is now post facet radiofrequency ablation, still little bit sore, he will keep his back pain care with the spine center. He still feels as though his back locks up significantly, I think he needs to get a little bit more time away from his RFA to see if this helps, he is currently in Robaxin, we can certainly try baclofen in the future if needed.

## 2020-02-04 NOTE — Assessment & Plan Note (Signed)
We are going to go and recommend a carbon fiber Morton's plate. Orthotics with first metatarsal ray posting have not been helpful.

## 2020-02-04 NOTE — Assessment & Plan Note (Signed)
Continues to have chronic numbness and tingling in the upper and lower extremities, nerve conduction and EMG showed no evidence of peripheral neuropathy. I had suspected that his paresthesias were residual from proximal compressive neuropathies, he continues with Lyrica, we tried alpha lipoic acid and vitamin B6, these were discontinued. He does have an appointment with another neurologist coming up in September. Heavy metal work-up, arthritis and rheumatoid work-up, all negative. I have also recommended consideration of complementary and alternative medicine.

## 2020-02-04 NOTE — Assessment & Plan Note (Signed)
Enlarged prostate on exam with Dr. Ashley Royalty, did not tolerate Flomax, now getting good relief with Cialis 7.5 mg daily, no changes needed.

## 2020-02-04 NOTE — Assessment & Plan Note (Signed)
Previous common flexor injection was approximately 3 months ago, increasing pain, repeated today, this is a separate process from his left cubital tunnel syndrome.

## 2020-02-04 NOTE — Assessment & Plan Note (Signed)
Cervical spine or I did not show anything that would explain his left ulnar neuropathy, I think at this point he is now a candidate for ulnar nerve transposition.

## 2020-02-04 NOTE — Progress Notes (Addendum)
    Procedures performed today:    Procedure: Real-time Ultrasound Guided injection of the left common flexor tendon origin Device: Samsung HS60  Verbal informed consent obtained.  Time-out conducted.  Noted no overlying erythema, induration, or other signs of local infection.  Skin prepped in a sterile fashion.  Local anesthesia: Topical Ethyl chloride.  With sterile technique and under real time ultrasound guidance: 1 cc Kenalog 40, 1 cc lidocaine, 1 cc bupivacaine injected easily Completed without difficulty  Pain immediately resolved suggesting accurate placement of the medication.  Advised to call if fevers/chills, erythema, induration, drainage, or persistent bleeding.  Images permanently stored and available for review in the ultrasound unit.  Impression: Technically successful ultrasound guided injection.  Independent interpretation of notes and tests performed by another provider:   None.  Brief History, Exam, Impression, and Recommendations:    Cubital tunnel syndrome, left Cervical spine or I did not show anything that would explain his left ulnar neuropathy, I think at this point he is now a candidate for ulnar nerve transposition.  DDD (degenerative disc disease), thoracolumbar Landan is now post facet radiofrequency ablation, still little bit sore, he will keep his back pain care with the spine center. He still feels as though his back locks up significantly, I think he needs to get a little bit more time away from his RFA to see if this helps, he is currently in Robaxin, we can certainly try baclofen in the future if needed.  Numbness and tingling of upper and lower extremities of both sides Continues to have chronic numbness and tingling in the upper and lower extremities, nerve conduction and EMG showed no evidence of peripheral neuropathy. I had suspected that his paresthesias were residual from proximal compressive neuropathies, he continues with Lyrica, we tried  alpha lipoic acid and vitamin B6, these were discontinued. He does have an appointment with another neurologist coming up in September. Heavy metal work-up, arthritis and rheumatoid work-up, all negative. I have also recommended consideration of complementary and alternative medicine.  Swelling of first metatarsophalangeal (MTP) joint of right foot We are going to go and recommend a carbon fiber Morton's plate. Orthotics with first metatarsal ray posting have not been helpful.  Urinary retention Enlarged prostate on exam with Dr. Ashley Royalty, did not tolerate Flomax, now getting good relief with Cialis 7.5 mg daily, no changes needed.  Medial epicondylitis, left Previous common flexor injection was approximately 3 months ago, increasing pain, repeated today, this is a separate process from his left cubital tunnel syndrome.    ___________________________________________ Shaun Camacho. Benjamin Stain, M.D., ABFM., CAQSM. Primary Care and Sports Medicine [facility]  Adjunct Instructor of Family Medicine  [employer]

## 2020-02-13 DIAGNOSIS — M5135 Other intervertebral disc degeneration, thoracolumbar region: Secondary | ICD-10-CM

## 2020-02-16 ENCOUNTER — Other Ambulatory Visit: Payer: Self-pay

## 2020-02-16 ENCOUNTER — Ambulatory Visit (INDEPENDENT_AMBULATORY_CARE_PROVIDER_SITE_OTHER): Payer: Managed Care, Other (non HMO)

## 2020-02-16 ENCOUNTER — Ambulatory Visit: Payer: Managed Care, Other (non HMO) | Admitting: Sports Medicine

## 2020-02-16 DIAGNOSIS — M5135 Other intervertebral disc degeneration, thoracolumbar region: Secondary | ICD-10-CM | POA: Diagnosis not present

## 2020-02-16 DIAGNOSIS — Z0271 Encounter for disability determination: Secondary | ICD-10-CM

## 2020-02-16 DIAGNOSIS — R339 Retention of urine, unspecified: Secondary | ICD-10-CM

## 2020-02-16 NOTE — Progress Notes (Signed)
    Procedures performed today:    None.  Independent interpretation of notes and tests performed by another provider:   None.  Brief History, Exam, Impression, and Recommendations:    Encounter for disability determination Additional disability paperwork filled out today.  I spent 15 minutes of total time managing this patient today, this includes chart review, face to face, and non-face to face time.   ___________________________________________ [name]. Benjamin Stain, M.D., ABFM., CAQSM. Primary Care and Sports Medicine [facility]  Adjunct Instructor of Family Medicine  [employer]

## 2020-02-16 NOTE — Assessment & Plan Note (Signed)
Additional disability paperwork filled out today. 

## 2020-02-17 MED ORDER — TADALAFIL 5 MG PO TABS: 7.5000 mg | ORAL_TABLET | Freq: Every day | ORAL | 11 refills | Status: DC

## 2020-02-23 ENCOUNTER — Other Ambulatory Visit: Payer: Self-pay

## 2020-02-23 ENCOUNTER — Encounter (HOSPITAL_BASED_OUTPATIENT_CLINIC_OR_DEPARTMENT_OTHER): Payer: Self-pay | Admitting: Orthopedic Surgery

## 2020-02-23 ENCOUNTER — Other Ambulatory Visit: Payer: Self-pay | Admitting: Orthopedic Surgery

## 2020-02-24 ENCOUNTER — Other Ambulatory Visit (HOSPITAL_COMMUNITY): Payer: Managed Care, Other (non HMO)

## 2020-02-25 ENCOUNTER — Other Ambulatory Visit (HOSPITAL_COMMUNITY)
Admission: RE | Admit: 2020-02-25 | Discharge: 2020-02-25 | Disposition: A | Discharge status: Home / Self Care | Payer: Managed Care, Other (non HMO) | Source: Ambulatory Visit | Attending: Orthopedic Surgery | Admitting: Orthopedic Surgery

## 2020-02-25 DIAGNOSIS — Z20822 Contact with and (suspected) exposure to covid-19: Secondary | ICD-10-CM | POA: Diagnosis not present

## 2020-02-25 DIAGNOSIS — Z01812 Encounter for preprocedural laboratory examination: Secondary | ICD-10-CM | POA: Insufficient documentation

## 2020-02-25 LAB — SARS CORONAVIRUS 2 (TAT 6-24 HRS): SARS Coronavirus 2: NEGATIVE

## 2020-02-25 NOTE — H&P (Signed)
Shaun Camacho is an 34 y.o. male.   CC / Reason for Visit: Left hand numbness and tingling HPI: This patient presents again for left hand numbness and tingling primarily involving the ring and small fingers.  He reports that ongoing evaluations looking for cervical etiology have not been fruitful.  In addition he does confirm for me that he had improvement following the cubital tunnel injection performed under ultrasound guidance by Dr. Althea Charon earlier this year.  Past Medical History:  Diagnosis Date  . Anxiety and depression 08/23/2016   08/23/2016 PHQ9 = 6, GAD7 = 7 09/12/2016 PHQ9 = 6, GAD7 = 3 10/11/2016 PHQ9 = 1, GAD7 = 1 12/27/2016 PHQ9 = 3, GAD7 = 4 01/24/2017 PHQ9 = 2, GAD7 = 5  . Arthritis   . Complication of anesthesia    when wakes up gets agitated   . GERD (gastroesophageal reflux disease) 12/29/2015  . Male hypogonadism 01/12/2016  . Radiculitis of right cervical region 03/22/2016   MRI from [facility] shows moderate to severe right C6-C7 neuroforaminal stenosis with mass-effect on the exiting right C7 nerve root.  . Sleep apnea    uses cpap    Past Surgical History:  Procedure Laterality Date  . ANTERIOR CERVICAL DECOMP/DISCECTOMY FUSION N/A 07/15/2018   Procedure: ANTERIOR CERVICAL DECOMPRESSION FUSION, CERVICAL FIVE-SIX, CERVICAL SIX-SEVEN WITH INSTRUMENTATION AND ALLOGRAFT;  Surgeon: Estill Bamberg, MD;  Location: [facility] OR;  Service: Orthopedics;  Laterality: N/A;  . CARPAL TUNNEL RELEASE Bilateral   . LATERAL EPICONDYLE RELEASE Right   . PILONIDAL CYST EXCISION N/A 09/03/2018   Procedure: EXCISION OF PILONIDAL DISEASE;  Surgeon: Karie Soda, MD;  Location: [facility] ORS;  Service: General;  Laterality: N/A;  . SURGERY SCROTAL / TESTICULAR      Family History  Problem Relation Age of Onset  . Thyroid disease Father   . Thyroid disease Sister   . Diabetes Maternal Grandmother    Social History:  reports that he has been smoking. He has a 9.00 pack-year smoking history. His  smokeless tobacco use includes chew. He reports current alcohol use. He reports that he does not use drugs.  Allergies:  Allergies  Allergen Reactions  . Tramadol Palpitations  . Meloxicam Other (See Comments)    Somnolence  . Sulfasalazine Nausea Only and Rash    No medications prior to admission.    No results found for this or any previous visit (from the past 48 hour(s)). No results found.  Review of Systems  All other systems reviewed and are negative.   Height 5\' 10"  (1.778 m), weight 97.5 kg. Physical Exam  Constitutional:  WD, WN, NAD HEENT:  NCAT, EOMI Neuro/Psych:  Alert & oriented to person, place, and time; appropriate mood & affect Lymphatic: No generalized UE edema or lymphadenopathy Extremities / MSK:  Both UE are normal with respect to appearance, ranges of motion, joint stability, muscle strength/tone, sensation, & perfusion except as otherwise noted:  On the left side, the ulnar nerve in the groove is tender with even the slightest manipulation, positive Tinel sign, monofilament testing 2.83 in all the digits.  Positive symptoms with elbow flexion testing  Labs / Xrays:  No radiographic studies obtained today.  Assessment:  Left ulnar neuropathy at the elbow, remains symptomatic despite transient improvement following cubital tunnel injection and with previous NCS/EMG failing to corroborate  Plan: I discussed these findings with him and the mixed evidence.  He understands the mixed nature of the evidence and would like to proceed with ulnar neuroplasty  at the elbow, hopefully on a Monday to help better accommodate his wife's working schedule.  We will plan to do so.  The details of the operative procedure were discussed with the patient.  Questions were invited and answered.  In addition to the goal of the procedure, the risks of the procedure to include but not limited to bleeding; infection; damage to the nerves or blood vessels that could result in bleeding,  numbness, weakness, chronic pain, and the need for additional procedures; stiffness; the need for revision surgery; and anesthetic risks were reviewed.  No specific outcome was guaranteed or implied.  Informed consent was obtained.  Jodi Marble, MD 02/25/2020, 12:36 PM

## 2020-02-28 ENCOUNTER — Ambulatory Visit (HOSPITAL_BASED_OUTPATIENT_CLINIC_OR_DEPARTMENT_OTHER): Payer: Managed Care, Other (non HMO) | Admitting: Anesthesiology

## 2020-02-28 ENCOUNTER — Ambulatory Visit (HOSPITAL_BASED_OUTPATIENT_CLINIC_OR_DEPARTMENT_OTHER)
Admission: RE | Admit: 2020-02-28 | Discharge: 2020-02-28 | Disposition: A | Discharge status: Home / Self Care | Payer: Managed Care, Other (non HMO) | Attending: Orthopedic Surgery | Admitting: Orthopedic Surgery

## 2020-02-28 ENCOUNTER — Encounter (HOSPITAL_BASED_OUTPATIENT_CLINIC_OR_DEPARTMENT_OTHER): Payer: Self-pay | Admitting: Orthopedic Surgery

## 2020-02-28 ENCOUNTER — Other Ambulatory Visit: Payer: Self-pay

## 2020-02-28 ENCOUNTER — Encounter (HOSPITAL_BASED_OUTPATIENT_CLINIC_OR_DEPARTMENT_OTHER)
Admission: RE | Disposition: A | Discharge status: Home / Self Care | Payer: Self-pay | Source: Home / Self Care | Attending: Orthopedic Surgery

## 2020-02-28 DIAGNOSIS — F419 Anxiety disorder, unspecified: Secondary | ICD-10-CM | POA: Diagnosis not present

## 2020-02-28 DIAGNOSIS — K219 Gastro-esophageal reflux disease without esophagitis: Secondary | ICD-10-CM | POA: Insufficient documentation

## 2020-02-28 DIAGNOSIS — G5622 Lesion of ulnar nerve, left upper limb: Secondary | ICD-10-CM | POA: Insufficient documentation

## 2020-02-28 DIAGNOSIS — Z833 Family history of diabetes mellitus: Secondary | ICD-10-CM | POA: Insufficient documentation

## 2020-02-28 DIAGNOSIS — Z8349 Family history of other endocrine, nutritional and metabolic diseases: Secondary | ICD-10-CM | POA: Insufficient documentation

## 2020-02-28 DIAGNOSIS — Z888 Allergy status to other drugs, medicaments and biological substances status: Secondary | ICD-10-CM | POA: Insufficient documentation

## 2020-02-28 DIAGNOSIS — M1990 Unspecified osteoarthritis, unspecified site: Secondary | ICD-10-CM | POA: Insufficient documentation

## 2020-02-28 DIAGNOSIS — G4730 Sleep apnea, unspecified: Secondary | ICD-10-CM | POA: Insufficient documentation

## 2020-02-28 DIAGNOSIS — F329 Major depressive disorder, single episode, unspecified: Secondary | ICD-10-CM | POA: Insufficient documentation

## 2020-02-28 DIAGNOSIS — Z882 Allergy status to sulfonamides status: Secondary | ICD-10-CM | POA: Insufficient documentation

## 2020-02-28 DIAGNOSIS — Z981 Arthrodesis status: Secondary | ICD-10-CM | POA: Insufficient documentation

## 2020-02-28 DIAGNOSIS — F17200 Nicotine dependence, unspecified, uncomplicated: Secondary | ICD-10-CM | POA: Diagnosis not present

## 2020-02-28 HISTORY — DX: Sleep apnea, unspecified: G47.30

## 2020-02-28 HISTORY — PX: ULNAR NERVE TRANSPOSITION: SHX2595

## 2020-02-28 SURGERY — ULNAR NERVE DECOMPRESSION/TRANSPOSITION
Anesthesia: Monitor Anesthesia Care | Site: Elbow | Laterality: Left

## 2020-02-28 MED ORDER — LIDOCAINE 2% (20 MG/ML) 5 ML SYRINGE
INTRAMUSCULAR | Status: AC
  Filled 2020-02-28: qty 5

## 2020-02-28 MED ORDER — LACTATED RINGERS IV SOLN: INTRAVENOUS | Status: DC

## 2020-02-28 MED ORDER — PROPOFOL 10 MG/ML IV BOLUS
INTRAVENOUS | Status: AC
  Filled 2020-02-28: qty 20

## 2020-02-28 MED ORDER — ONDANSETRON HCL 4 MG/2ML IJ SOLN
INTRAMUSCULAR | Status: DC | PRN
  Administered 2020-02-28: 4 mg via INTRAVENOUS

## 2020-02-28 MED ORDER — LIDOCAINE HCL (CARDIAC) PF 100 MG/5ML IV SOSY
PREFILLED_SYRINGE | INTRAVENOUS | Status: DC | PRN
  Administered 2020-02-28: 40 mg via INTRAVENOUS

## 2020-02-28 MED ORDER — FENTANYL CITRATE (PF) 100 MCG/2ML IJ SOLN
100.0000 ug | Freq: Once | INTRAMUSCULAR | Status: AC
  Administered 2020-02-28: 100 ug via INTRAVENOUS

## 2020-02-28 MED ORDER — PROPOFOL 500 MG/50ML IV EMUL
INTRAVENOUS | Status: AC
  Filled 2020-02-28: qty 250

## 2020-02-28 MED ORDER — PROPOFOL 10 MG/ML IV BOLUS
INTRAVENOUS | Status: DC | PRN
  Administered 2020-02-28: 10 mg via INTRAVENOUS
  Administered 2020-02-28: 20 mg via INTRAVENOUS
  Administered 2020-02-28: 10 mg via INTRAVENOUS
  Administered 2020-02-28: 20 mg via INTRAVENOUS

## 2020-02-28 MED ORDER — FENTANYL CITRATE (PF) 100 MCG/2ML IJ SOLN: 25.0000 ug | INTRAMUSCULAR | Status: DC | PRN

## 2020-02-28 MED ORDER — ACETAMINOPHEN 500 MG PO TABS
1000.0000 mg | ORAL_TABLET | Freq: Once | ORAL | Status: AC
  Administered 2020-02-28: 1000 mg via ORAL

## 2020-02-28 MED ORDER — PROPOFOL 500 MG/50ML IV EMUL
INTRAVENOUS | Status: DC | PRN
  Administered 2020-02-28: 150 ug/kg/min via INTRAVENOUS

## 2020-02-28 MED ORDER — ACETAMINOPHEN 500 MG PO TABS
ORAL_TABLET | ORAL | Status: AC
  Filled 2020-02-28: qty 2

## 2020-02-28 MED ORDER — BUPIVACAINE-EPINEPHRINE (PF) 0.5% -1:200000 IJ SOLN
INTRAMUSCULAR | Status: DC | PRN
  Administered 2020-02-28: 30 mL via PERINEURAL

## 2020-02-28 MED ORDER — EPHEDRINE 5 MG/ML INJ
INTRAVENOUS | Status: AC
  Filled 2020-02-28: qty 10

## 2020-02-28 MED ORDER — DEXAMETHASONE SODIUM PHOSPHATE 10 MG/ML IJ SOLN
INTRAMUSCULAR | Status: AC
  Filled 2020-02-28: qty 1

## 2020-02-28 MED ORDER — ONDANSETRON HCL 4 MG/2ML IJ SOLN
INTRAMUSCULAR | Status: AC
  Filled 2020-02-28: qty 8

## 2020-02-28 MED ORDER — CEFAZOLIN SODIUM-DEXTROSE 2-4 GM/100ML-% IV SOLN
2.0000 g | INTRAVENOUS | Status: AC
  Administered 2020-02-28: 2 g via INTRAVENOUS

## 2020-02-28 MED ORDER — ACETAMINOPHEN 325 MG PO TABS: 650.0000 mg | ORAL_TABLET | Freq: Four times a day (QID) | ORAL | Status: AC

## 2020-02-28 MED ORDER — OXYCODONE HCL 5 MG PO TABS
5.0000 mg | ORAL_TABLET | Freq: Four times a day (QID) | ORAL | 0 refills | Status: DC | PRN
Start: 2020-02-28 — End: 2020-07-20

## 2020-02-28 MED ORDER — MIDAZOLAM HCL 2 MG/2ML IJ SOLN
2.0000 mg | Freq: Once | INTRAMUSCULAR | Status: AC
  Administered 2020-02-28: 2 mg via INTRAVENOUS

## 2020-02-28 MED ORDER — MIDAZOLAM HCL 2 MG/2ML IJ SOLN
INTRAMUSCULAR | Status: AC
  Filled 2020-02-28: qty 2

## 2020-02-28 MED ORDER — FENTANYL CITRATE (PF) 100 MCG/2ML IJ SOLN
INTRAMUSCULAR | Status: AC
  Filled 2020-02-28: qty 2

## 2020-02-28 MED ORDER — CEFAZOLIN SODIUM-DEXTROSE 2-4 GM/100ML-% IV SOLN
INTRAVENOUS | Status: AC
  Filled 2020-02-28: qty 100

## 2020-02-28 SURGICAL SUPPLY — 60 items
BENZOIN TINCTURE PRP APPL 2/3 (GAUZE/BANDAGES/DRESSINGS) ×2 IMPLANT
BLADE MINI RND TIP GREEN BEAV (BLADE) IMPLANT
BLADE SURG 15 STRL LF DISP TIS (BLADE) ×1 IMPLANT
BLADE SURG 15 STRL SS (BLADE) ×1
BNDG COHESIVE 4X5 TAN STRL (GAUZE/BANDAGES/DRESSINGS) ×2 IMPLANT
BNDG ESMARK 4X9 LF (GAUZE/BANDAGES/DRESSINGS) ×2 IMPLANT
BNDG GAUZE ELAST 4 BULKY (GAUZE/BANDAGES/DRESSINGS) ×2 IMPLANT
CHLORAPREP W/TINT 26 (MISCELLANEOUS) ×2 IMPLANT
CORD BIPOLAR FORCEPS 12FT (ELECTRODE) ×2 IMPLANT
COVER BACK TABLE 60X90IN (DRAPES) ×2 IMPLANT
COVER MAYO STAND STRL (DRAPES) ×2 IMPLANT
COVER WAND RF STERILE (DRAPES) IMPLANT
CUFF TOURN SGL QUICK 18X3 (MISCELLANEOUS) ×2 IMPLANT
DRAIN PENROSE 1/2X12 LTX STRL (WOUND CARE) IMPLANT
DRAIN PENROSE 1/4X12 LTX STRL (WOUND CARE) IMPLANT
DRAPE EXTREMITY T 121X128X90 (DISPOSABLE) ×2 IMPLANT
DRAPE SURG 17X23 STRL (DRAPES) ×2 IMPLANT
DRAPE U-SHAPE 47X51 STRL (DRAPES) IMPLANT
DRSG ADAPTIC 3X8 NADH LF (GAUZE/BANDAGES/DRESSINGS) ×2 IMPLANT
DRSG EMULSION OIL 3X3 NADH (GAUZE/BANDAGES/DRESSINGS) ×4 IMPLANT
ELECT REM PT RETURN 9FT ADLT (ELECTROSURGICAL) ×2
ELECTRODE REM PT RTRN 9FT ADLT (ELECTROSURGICAL) ×1 IMPLANT
GAUZE SPONGE 4X4 12PLY STRL LF (GAUZE/BANDAGES/DRESSINGS) ×2 IMPLANT
GLOVE BIO SURGEON STRL SZ7 (GLOVE) ×4 IMPLANT
GLOVE BIO SURGEON STRL SZ7.5 (GLOVE) ×2 IMPLANT
GLOVE BIOGEL PI IND STRL 7.0 (GLOVE) ×1 IMPLANT
GLOVE BIOGEL PI IND STRL 7.5 (GLOVE) ×1 IMPLANT
GLOVE BIOGEL PI IND STRL 8 (GLOVE) ×1 IMPLANT
GLOVE BIOGEL PI INDICATOR 7.0 (GLOVE) ×1
GLOVE BIOGEL PI INDICATOR 7.5 (GLOVE) ×1
GLOVE BIOGEL PI INDICATOR 8 (GLOVE) ×1
GLOVE ECLIPSE 6.5 STRL STRAW (GLOVE) ×2 IMPLANT
GOWN STRL REUS W/ TWL LRG LVL3 (GOWN DISPOSABLE) ×2 IMPLANT
GOWN STRL REUS W/ TWL XL LVL3 (GOWN DISPOSABLE) ×1 IMPLANT
GOWN STRL REUS W/TWL LRG LVL3 (GOWN DISPOSABLE) ×2
GOWN STRL REUS W/TWL XL LVL3 (GOWN DISPOSABLE) ×3 IMPLANT
LOOP VESSEL MAXI BLUE (MISCELLANEOUS) ×2 IMPLANT
NEEDLE HYPO 25X1 1.5 SAFETY (NEEDLE) IMPLANT
NS IRRIG 1000ML POUR BTL (IV SOLUTION) ×2 IMPLANT
PACK BASIN DAY SURGERY FS (CUSTOM PROCEDURE TRAY) ×2 IMPLANT
PADDING CAST ABS 4INX4YD NS (CAST SUPPLIES) ×1
PADDING CAST ABS COTTON 4X4 ST (CAST SUPPLIES) ×1 IMPLANT
PENCIL SMOKE EVACUATOR (MISCELLANEOUS) ×2 IMPLANT
SLEEVE SCD COMPRESS KNEE MED (MISCELLANEOUS) ×2 IMPLANT
SLING ARM FOAM STRAP LRG (SOFTGOODS) IMPLANT
SLING ARM FOAM STRAP XLG (SOFTGOODS) ×2 IMPLANT
STOCKINETTE 6  STRL (DRAPES) ×1
STOCKINETTE 6 STRL (DRAPES) ×1 IMPLANT
STRIP CLOSURE SKIN 1/2X4 (GAUZE/BANDAGES/DRESSINGS) ×2 IMPLANT
SUT ETHILON 8 0 BV130 4 (SUTURE) IMPLANT
SUT VIC AB 0 SH 27 (SUTURE) IMPLANT
SUT VIC AB 2-0 SH 27 (SUTURE)
SUT VIC AB 2-0 SH 27XBRD (SUTURE) IMPLANT
SUT VIC AB 3-0 SH 27 (SUTURE) ×1
SUT VIC AB 3-0 SH 27X BRD (SUTURE) ×1 IMPLANT
SUT VICRYL RAPIDE 4/0 PS 2 (SUTURE) ×2 IMPLANT
SYR 10ML LL (SYRINGE) IMPLANT
SYR BULB EAR ULCER 3OZ GRN STR (SYRINGE) ×2 IMPLANT
TOWEL GREEN STERILE FF (TOWEL DISPOSABLE) ×4 IMPLANT
UNDERPAD 30X36 HEAVY ABSORB (UNDERPADS AND DIAPERS) ×2 IMPLANT

## 2020-02-28 NOTE — Transfer of Care (Signed)
Immediate Anesthesia Transfer of Care Note  Patient: Shaun Camacho  Procedure(s) Performed: LEFT ULNAR NEUROPLASTY AT HE ELBOW (Left Elbow)  Patient Location: PACU  Anesthesia Type:MAC and Regional  Level of Consciousness: awake, alert  and oriented  Airway & Oxygen Therapy: Patient Spontanous Breathing and Patient connected to face mask oxygen  Post-op Assessment: Report given to RN and Post -op Vital signs reviewed and stable  Post vital signs: Reviewed and stable  Last Vitals:  Vitals Value Taken Time  BP 130/73 02/28/20 1250  Temp    Pulse 80 02/28/20 1251  Resp 13 02/28/20 1251  SpO2 100 % 02/28/20 1251  Vitals shown include unvalidated device data.  Last Pain:  Vitals:   02/28/20 1100  TempSrc:   PainSc: 0-No pain      Patients Stated Pain Goal: 4 (42/35/36 1443)  Complications: No complications documented.

## 2020-02-28 NOTE — Interval H&P Note (Signed)
History and Physical Interval Note:  02/28/2020 10:41 AM  Shaun Camacho  has presented today for surgery, with the diagnosis of LEFT ULNAR NEUROPLATHY.  The various methods of treatment have been discussed with the patient and family. After consideration of risks, benefits and other options for treatment, the patient has consented to  Procedure(s): LEFT ULNAR NEUROPLASTY AT HE ELBOW (Left) as a surgical intervention.  The patient's history has been reviewed, patient examined, no change in status, stable for surgery.  I have reviewed the patient's chart and labs.  Questions were answered to the patient's satisfaction.     Jodi Marble

## 2020-02-28 NOTE — Op Note (Signed)
02/28/2020  10:42 AM  PATIENT:  Shaun Camacho  34 y.o. male  PRE-OPERATIVE DIAGNOSIS: Left ulnar neuropathy at the elbow  POST-OPERATIVE DIAGNOSIS:  Same  PROCEDURE: Left ulnar neuroplasty at the elbow with anterior subcutaneous transposition  SURGEON: Rayvon Char. Grandville Silos, MD  PHYSICIAN ASSISTANT: Morley Kos, OPA-C  ANESTHESIA:  regional and MAC  SPECIMENS:  None  DRAINS:   None  EBL:  less than 50 mL  PREOPERATIVE INDICATIONS:  Shaun Camacho is a  34 y.o. male with left ulnar neuropathy at the elbow, improved temporarily with injection, but with recurrent symptoms.  The risks benefits and alternatives were discussed with the patient preoperatively including but not limited to the risks of infection, bleeding, nerve injury, cardiopulmonary complications, the need for revision surgery, among others, and the patient verbalized understanding and consented to proceed.  Specifically, we discussed the matter related to his Enbrel, which he took on Saturday.  We discussed slightly increased risk for infection in such circumstance and the possibility of rescheduling, but he indicated he wished to proceed today as previously planned, despite any slight increased risk and infection.  OPERATIVE IMPLANTS: None  OPERATIVE PROCEDURE:  After receiving prophylactic antibiotics and a regional block, the patient was escorted to the operative theatre and placed in a supine position.  A surgical "time-out" was performed during which the planned procedure, proposed operative site, and the correct patient identity were compared to the operative consent and agreement confirmed by the circulating nurse according to current facility policy.  Following application of a tourniquet to the operative extremity, the exposed skin was prepped with Chloraprep and draped in the usual sterile fashion.  The limb was exsanguinated with an Esmarch bandage and the tourniquet inflated to approximately 114mHg higher  than systolic BP.  A curvilinear incision was made over the course of the ulnar nerve at the level of the elbow.  Full-thickness flaps were elevated.  Care was taken to preserve the crossing cutaneous nerves particularly those of the medial brachial antebrachial cutaneous and elevating them up in the full-thickness flap off of the fascia.  The fascia was then incised over the nerve, the nerve was decompressed from 10 cm proximal to Osborne's ligament to 10 cm distal, well within the course of the FCU releasing both the superficial and deep fascia of the FCU.  The nerve appeared most irritable where it was trapped under the leading edge and deep fascia of the FCU.  Once this was done, the elbow was ranged, and the nerve found to ride way up onto the medial epicondyle, push there by the well-developed medial triceps.  For that reason, decision was made to proceed with subcutaneous transposition.  The subcutaneous flap was elevated, the medial intermuscular septum distal portion was excised.  The nerve was then mobilized and transposed anteriorly together with its running longitudinal vein.  Soft tissues at the medial epicondyle from the flexor pronator insertion were then lifted and peeled back anteriorly.  The superficial fascia over the nerve was then secured to this, creating a restraint from posterior subluxation of the nerve.  The nerve was not kinked excessively or tethered and moved back and forth nicely as the elbow was cycled.  It remained anterior to the prominence of the medial epicondyle.  The tourniquet was released additional hemostasis obtained and then the wound was closed, with 3-0 Vicryl suture reapproximated deep dermal buried layers with subcuticular suture followed by running 4-0 Vicryl Rapide horizontal mattress suture in the skin.  A bulky dressing  was applied and he was taken to the recovery room in stable condition  DISPOSITION: He will be discharged home today with typical instructions  returning in 10 to 15 days.

## 2020-02-28 NOTE — Anesthesia Procedure Notes (Signed)
Anesthesia Regional Block: Supraclavicular block   Pre-Anesthetic Checklist: ,, timeout performed, Correct Patient, Correct Site, Correct Laterality, Correct Procedure, Correct Position, site marked, Risks and benefits discussed, pre-op evaluation,  At surgeon's request and post-op pain management  Laterality: Left  Prep: Maximum Sterile Barrier Precautions used, chloraprep       Needles:  Injection technique: Single-shot  Needle Type: Echogenic Stimulator Needle     Needle Length: 5cm  Needle Gauge: 22     Additional Needles:   Procedures:,,,, ultrasound used (permanent image in chart),,,,  Narrative:  Start time: 02/28/2020 10:48 AM End time: 02/28/2020 10:58 AM Injection made incrementally with aspirations every 5 mL. Anesthesiologist: Gaynelle Adu, MD  Additional Notes: 2% Lidocaine skin wheel.

## 2020-02-28 NOTE — Anesthesia Preprocedure Evaluation (Addendum)
Anesthesia Evaluation  Patient identified by MRN, date of birth, ID band Patient awake    Reviewed: Allergy & Precautions, H&P , NPO status , Patient's Chart, lab work & pertinent test results  Airway Mallampati: II  TM Distance: >3 FB Neck ROM: Full    Dental no notable dental hx. (+) Teeth Intact, Dental Advisory Given   Pulmonary sleep apnea and Continuous Positive Airway Pressure Ventilation , Current Smoker and Patient abstained from smoking.,    Pulmonary exam normal breath sounds clear to auscultation       Cardiovascular negative cardio ROS   Rhythm:Regular Rate:Normal     Neuro/Psych Anxiety Depression negative neurological ROS     GI/Hepatic Neg liver ROS, GERD  Medicated,  Endo/Other  negative endocrine ROS  Renal/GU negative Renal ROS  negative genitourinary   Musculoskeletal  (+) Arthritis , Osteoarthritis,    Abdominal   Peds  Hematology negative hematology ROS (+)   Anesthesia Other Findings   Reproductive/Obstetrics negative OB ROS                           Anesthesia Physical Anesthesia Plan  ASA: III  Anesthesia Plan: MAC and Regional   Post-op Pain Management:    Induction: Intravenous  PONV Risk Score and Plan: 0 and Propofol infusion, Midazolam and Ondansetron  Airway Management Planned: Simple Face Mask  Additional Equipment:   Intra-op Plan:   Post-operative Plan:   Informed Consent: I have reviewed the patients History and Physical, chart, labs and discussed the procedure including the risks, benefits and alternatives for the proposed anesthesia with the patient or authorized representative who has indicated his/her understanding and acceptance.     Dental advisory given  Plan Discussed with: CRNA  Anesthesia Plan Comments:        Anesthesia Quick Evaluation

## 2020-02-28 NOTE — Discharge Instructions (Signed)
Discharge Instructions   You have a light dressing on your hand.  You may begin gentle motion of your fingers and hand immediately, but you should not do any heavy lifting or gripping.  Elevate your hand to reduce pain & swelling of the digits.  Ice over the operative site may be helpful to reduce pain & swelling.  DO NOT USE HEAT. Pain medicine has been prescribed for you.  Return to Celebrex as prescribed. Take Tylenol 650 mg every 6 hours for pain. Additionally, take Oxycodone 5 mg for severe post operative pain as a rescue medicine. Leave the dressing in place until the third day after your surgery and then remove it, leaving it open to air.  After the bandage has been removed you may shower, regularly washing the incision and letting the water run over it, but not submerging it (no swimming, soaking it in dishwater, etc.) You may drive a car when you are off of prescription pain medications and can safely control your vehicle with both hands. We will address whether therapy will be required or not when you return to the office. You may have already made your follow-up appointment when we completed your preop visit.  If not, please call our office today or the next business day to make your return appointment for 10-15 days after surgery.   Please call [phone] during normal business hours or [phone] after hours for any problems. Including the following:  - excessive redness of the incisions - drainage for more than 4 days - fever of more than 101.5 F  *Please note that pain medications will not be refilled after hours or on weekends.  No lifting, gripping or grasping greater than pencil and paper tasks until you return to clinic for your first post operative appointment..  May take Tylenol after 5:30 pm today, if needed.    Post Anesthesia Home Care Instructions  Activity: Get plenty of rest for the remainder of the day. A responsible individual must stay with you for 24  hours following the procedure.  For the next 24 hours, DO NOT: -Drive a car -Advertising copywriter -Drink alcoholic beverages -Take any medication unless instructed by your physician -Make any legal decisions or sign important papers.  Meals: Start with liquid foods such as gelatin or soup. Progress to regular foods as tolerated. Avoid greasy, spicy, heavy foods. If nausea and/or vomiting occur, drink only clear liquids until the nausea and/or vomiting subsides. Call your physician if vomiting continues.  Special Instructions/Symptoms: Your throat may feel dry or sore from the anesthesia or the breathing tube placed in your throat during surgery. If this causes discomfort, gargle with warm salt water. The discomfort should disappear within 24 hours.  If you had a scopolamine patch placed behind your ear for the management of post- operative nausea and/or vomiting:  1. The medication in the patch is effective for 72 hours, after which it should be removed.  Wrap patch in a tissue and discard in the trash. Wash hands thoroughly with soap and water. 2. You may remove the patch earlier than 72 hours if you experience unpleasant side effects which may include dry mouth, dizziness or visual disturbances. 3. Avoid touching the patch. Wash your hands with soap and water after contact with the patch.    Regional Anesthesia Blocks  1. Numbness or the inability to move the "blocked" extremity may last from 3-48 hours after placement. The length of time depends on the medication injected and your individual response  to the medication. If the numbness is not going away after 48 hours, call your surgeon.  2. The extremity that is blocked will need to be protected until the numbness is gone and the  Strength has returned. Because you cannot feel it, you will need to take extra care to avoid injury. Because it may be weak, you may have difficulty moving it or using it. You may not know what position it is in  without looking at it while the block is in effect.  3. For blocks in the legs and feet, returning to weight bearing and walking needs to be done carefully. You will need to wait until the numbness is entirely gone and the strength has returned. You should be able to move your leg and foot normally before you try and bear weight or walk. You will need someone to be with you when you first try to ensure you do not fall and possibly risk injury.  4. Bruising and tenderness at the needle site are common side effects and will resolve in a few days.  5. Persistent numbness or new problems with movement should be communicated to the surgeon or the [facility] [phone] [facility] [phone]).

## 2020-02-28 NOTE — Anesthesia Postprocedure Evaluation (Signed)
Anesthesia Post Note  Patient: Shaun Camacho  Procedure(s) Performed: LEFT ULNAR NEUROPLASTY AT HE ELBOW (Left Elbow)     Patient location during evaluation: PACU Anesthesia Type: Regional Level of consciousness: awake and alert Pain management: pain level controlled Vital Signs Assessment: post-procedure vital signs reviewed and stable Respiratory status: spontaneous breathing, nonlabored ventilation, respiratory function stable and patient connected to nasal cannula oxygen Cardiovascular status: stable and blood pressure returned to baseline Postop Assessment: no apparent nausea or vomiting Anesthetic complications: no   No complications documented.  Last Vitals:  Vitals:   02/28/20 1305 02/28/20 1335  BP: (!) 144/83 132/73  Pulse: 78 60  Resp: 19 18  Temp:  (!) 36.2 C  SpO2: 100% 100%    Last Pain:  Vitals:   02/28/20 1335  TempSrc:   PainSc: 0-No pain                 Bonifacio Pruden P Aveah Castell

## 2020-02-28 NOTE — Progress Notes (Signed)
Assisted Dr. Casilda Carls with supraclavicular block. Side rails up, monitors on throughout procedure. See vital signs in flow sheet. Tolerated Procedure well.

## 2020-02-29 ENCOUNTER — Encounter (HOSPITAL_BASED_OUTPATIENT_CLINIC_OR_DEPARTMENT_OTHER): Payer: Self-pay | Admitting: Orthopedic Surgery

## 2020-02-29 NOTE — Addendum Note (Signed)
Addendum  created 02/29/20 1228 by Carlisha Wisler, Jewel Baize, CRNA   Charge Capture section accepted

## 2020-03-14 ENCOUNTER — Other Ambulatory Visit: Payer: Self-pay | Admitting: Sports Medicine

## 2020-03-14 DIAGNOSIS — R202 Paresthesia of skin: Secondary | ICD-10-CM

## 2020-03-14 DIAGNOSIS — E291 Testicular hypofunction: Secondary | ICD-10-CM

## 2020-03-14 DIAGNOSIS — R200 Anesthesia of skin: Secondary | ICD-10-CM

## 2020-03-20 ENCOUNTER — Encounter: Payer: Self-pay | Admitting: Gastroenterology

## 2020-03-20 ENCOUNTER — Ambulatory Visit (INDEPENDENT_AMBULATORY_CARE_PROVIDER_SITE_OTHER): Payer: Managed Care, Other (non HMO) | Admitting: Gastroenterology

## 2020-03-20 VITALS — BP 112/66 | HR 93 | Ht 70.0 in | Wt 217.0 lb

## 2020-03-20 DIAGNOSIS — R9389 Abnormal findings on diagnostic imaging of other specified body structures: Secondary | ICD-10-CM

## 2020-03-20 DIAGNOSIS — R1310 Dysphagia, unspecified: Secondary | ICD-10-CM

## 2020-03-20 DIAGNOSIS — Z9889 Other specified postprocedural states: Secondary | ICD-10-CM | POA: Diagnosis not present

## 2020-03-20 DIAGNOSIS — Z01818 Encounter for other preprocedural examination: Secondary | ICD-10-CM

## 2020-03-20 DIAGNOSIS — R1319 Other dysphagia: Secondary | ICD-10-CM

## 2020-03-20 DIAGNOSIS — L4050 Arthropathic psoriasis, unspecified: Secondary | ICD-10-CM

## 2020-03-20 DIAGNOSIS — K219 Gastro-esophageal reflux disease without esophagitis: Secondary | ICD-10-CM

## 2020-03-20 NOTE — Patient Instructions (Signed)
If you are age 36 or older, your body mass index should be between 23-30. Your Body mass index is 31.14 kg/m. If this is out of the aforementioned range listed, please consider follow up with your Primary Care Provider.  If you are age 74 or younger, your body mass index should be between 19-25. Your Body mass index is 31.14 kg/m. If this is out of the aformentioned range listed, please consider follow up with your Primary Care Provider.   You have been scheduled for a Barium Esophogram at [facility] at [address] [place], [place]  on 03/24/20 at 11:30 am. Please arrive 15 minutes prior to your appointment for registration. Make certain not to have anything to eat or drink 3 hours prior to your test. If you need to reschedule for any reason, please contact radiology at [phone] to do so. __________________________________________________________________ A barium swallow is an examination that concentrates on views of the esophagus. This tends to be a double contrast exam (barium and two liquids which, when combined, create a gas to distend the wall of the oesophagus) or single contrast (non-ionic iodine based). The study is usually tailored to your symptoms so a good history is essential. Attention is paid during the study to the form, structure and configuration of the esophagus, looking for functional disorders (such as aspiration, dysphagia, achalasia, motility and reflux) EXAMINATION You may be asked to change into a gown, depending on the type of swallow being performed. A radiologist and radiographer will perform the procedure. The radiologist will advise you of the type of contrast selected for your procedure and direct you during the exam. You will be asked to stand, sit or lie in several different positions and to hold a small amount of fluid in your mouth before being asked to swallow while the imaging is performed .In some instances you may be asked to swallow barium coated  marshmallows to assess the motility of a solid food bolus. The exam can be recorded as a digital or video fluoroscopy procedure. POST PROCEDURE It will take 1-2 days for the barium to pass through your system. To facilitate this, it is important, unless otherwise directed, to increase your fluids for the next 24-48hrs and to resume your normal diet.  This test typically takes about 30 minutes to perform. __________________________________________________________________________________ Shaun Camacho have been scheduled for an endoscopy. Please follow written instructions given to you at your visit today. If you use inhalers (even only as needed), please bring them with you on the day of your procedure.  It was a pleasure to see you today!  Vito Cirigliano, D.O.

## 2020-03-20 NOTE — Progress Notes (Signed)
Chief Complaint: GERD, dysphagia, abnormal imaging study  Referring Provider:     Monica Becton, MD    HPI:     Shaun Camacho is a 34 y.o. male with a history of OSA (on CPAP), anxiety/depression, Psoriatic Arthritis, referred to the Gastroenterology Clinic for evaluation of reflux symptoms and dysphagia.  He reports a long-standing hx of reflux for many years.  Index symptoms of heartburn and regurgitation.  Reflux was well contr GERD olled with omeprazole 40 mg/day for many years.  Would occasionally use apple cider vinegar or hot tea with honey prn breakthrough symptoms.    Reflux symptoms started to worsen 6-8 months ago.  Was seen by his PCM and increased omeprazole to 40 mg bid with good clinical response.  However, has been unable to wean off of high-dose therapy due to breakthrough symptoms at daily dosing.  More recently has developed dysphagia to solid foods and pills, pointing to anterior neck.  No melena, hematochezia.   2 months ago with episode of thick mucus/like emesis after eating seafood.   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  Prior to that was [employer].  Was incidentally also noted to have nonspecific esophageal wall thickening on recent MRI cervical spine.  This was similar in appearance to MRI from 2017 per report.  No previous EGD or colonoscopy.  Additionally, history of Psoriatic Arthritis and follows with the Rheumatology Clinic at [facility].  Previously intolerant to MTX and Humira.  Currently taking Enbrel.  Also follows at [facility] and has received nerve blocks in the lumbar spine.    Past Medical History:  Diagnosis Date  . Anxiety and depression 08/23/2016   08/23/2016 PHQ9 = 6, GAD7 = 7 09/12/2016 PHQ9 = 6, GAD7 = 3 10/11/2016 PHQ9 = 1, GAD7 = 1 12/27/2016 PHQ9 = 3, GAD7 = 4 01/24/2017 PHQ9 = 2, GAD7 = 5  . Arthritis   . Chronic headache   .  Complication of anesthesia    when wakes up gets agitated   . GERD (gastroesophageal reflux disease) 12/29/2015  . Male hypogonadism 01/12/2016  . Radiculitis of right cervical region 03/22/2016   MRI from [facility] shows moderate to severe right C6-C7 neuroforaminal stenosis with mass-effect on the exiting right C7 nerve root.  . Sleep apnea    uses cpap     Past Surgical History:  Procedure Laterality Date  . ANTERIOR CERVICAL DECOMP/DISCECTOMY FUSION N/A 07/15/2018   Procedure: ANTERIOR CERVICAL DECOMPRESSION FUSION, CERVICAL FIVE-SIX, CERVICAL SIX-SEVEN WITH INSTRUMENTATION AND ALLOGRAFT;  Surgeon: Estill Bamberg, MD;  Location: [facility] OR;  Service: Orthopedics;  Laterality: N/A;  . CARPAL TUNNEL RELEASE Bilateral    07/2019, 11/2019  . LATERAL EPICONDYLE RELEASE Right   . PILONIDAL CYST EXCISION N/A 09/03/2018   Procedure: EXCISION OF PILONIDAL DISEASE;  Surgeon: Karie Soda, MD;  Location: [facility] ORS;  Service: General;  Laterality: N/A;  . SURGERY SCROTAL / TESTICULAR    . ULNAR NERVE TRANSPOSITION Left 02/28/2020   Procedure: LEFT ULNAR NEUROPLASTY AT HE ELBOW;  Surgeon: Mack Hook, MD;  Location: [facility];  Service: Orthopedics;  Laterality: Left;   Family History  Problem Relation Age of Onset  . Thyroid disease Father   . Thyroid disease Sister   . Diabetes Maternal Grandmother   . Colon cancer Maternal Grandfather   . Colon cancer Other  Maternal uncle son   . Heart disease Maternal Great-grandmother   . Esophageal cancer Neg Hx   . Prostate cancer Neg Hx    Social History   Tobacco Use  . Smoking status: Current Every Day Smoker    Packs/day: 0.50    Years: 18.00    Pack years: 9.00  . Smokeless tobacco: Current User    Types: Chew  Vaping Use  . Vaping Use: Never used  Substance Use Topics  . Alcohol use: Yes    Comment: rare  . Drug use: No   Current Outpatient Medications  Medication Sig Dispense Refill  . acetaminophen (TYLENOL)  325 MG tablet Take 2 tablets (650 mg total) by mouth every 6 (six) hours.    Marland Kitchen amitriptyline (ELAVIL) 50 MG tablet One half tab PO qHS for a week, then one tab PO qHS. 30 tablet 3  . celecoxib (CELEBREX) 100 MG capsule Take 100 mg by mouth 2 (two) times daily.    . clobetasol cream (TEMOVATE) 0.05 % Apply 1 application topically 2 (two) times daily. 60 g 2  . ENBREL SURECLICK 50 MG/ML injection Inject into the skin once a week.    . methocarbamol (ROBAXIN) 750 MG tablet Take 750 mg by mouth every 6 (six) hours as needed.    Marland Kitchen NEEDLE, DISP, 18 G (BD HYPODERMIC NEEDLE) 18G X 1" MISC USE DAILY AS DIRECTED 10 each 1  . omeprazole (PRILOSEC) 40 MG capsule Take 1 capsule (40 mg total) by mouth 2 (two) times daily with a meal. 180 capsule 3  . oxyCODONE (ROXICODONE) 5 MG immediate release tablet Take 1 tablet (5 mg total) by mouth every 6 (six) hours as needed for severe pain. 20 tablet 0  . pregabalin (LYRICA) 300 MG capsule TAKE 1 CAPSULE(300 MG) BY MOUTH TWICE DAILY 60 capsule 3  . SYRINGE-NEEDLE, DISP, 3 ML (B-D 3CC LUER-LOK SYR 22GX1-1/2) 22G X 1-1/2" 3 ML MISC USE DAILY AS DIRECTED 10 each 1  . tadalafil (CIALIS) 5 MG tablet Take 1.5 tablets (7.5 mg total) by mouth daily. 45 tablet 11  . testosterone cypionate (DEPOTESTOSTERONE CYPIONATE) 200 MG/ML injection INJECT 1 ML INTO THE MUSCLE EVERY 14 DAYS 10 mL 3   No current facility-administered medications for this visit.   Allergies  Allergen Reactions  . Tramadol Palpitations  . Meloxicam Other (See Comments)    Somnolence  . Sulfasalazine Nausea Only and Rash     Review of Systems: All systems reviewed and negative except where noted in HPI.     Physical Exam:    Wt Readings from Last 3 Encounters:  03/20/20 217 lb (98.4 kg)  02/28/20 213 lb 13.5 oz (97 kg)  09/29/19 210 lb (95.3 kg)    BP 112/66   Pulse 93   Ht 5\' 10"  (1.778 m)   Wt 217 lb (98.4 kg)   BMI 31.14 kg/m  Constitutional:  Pleasant, in no acute  distress. Psychiatric: Normal mood and affect. Behavior is normal. EENT: Pupils normal.  Conjunctivae are normal. No scleral icterus. Neck supple. No cervical LAD. Cardiovascular: Normal rate, regular rhythm. No edema Pulmonary/chest: Effort normal and breath sounds normal. No wheezing, rales or rhonchi. Abdominal: Soft, nondistended, nontender. Bowel sounds active throughout. There are no masses palpable. No hepatomegaly. Neurological: Alert and oriented to person place and time. Skin: Skin is warm and dry. No rashes noted.   ASSESSMENT AND PLAN;   1) GERD 2) Dysphagia 3) History of cervical spine surgery 4) History of Psoriatic  Arthritis  Longstanding history of reflux, but more recently has required high-dose PPI.  Now with intermittent dysphagia to solid foods and pills.  Dysphagia certainly could be related to underlying reflux, as well as related to C-spine surgery with hardware in 07/2018.  Plan for the following:  -Barium esophagram -EGD with possible esophageal dilation to follow barium esophagram -Resume current PPI -Antireflux lifestyle/dietary modifications -Cut food into small pieces, eat small bites, chew food thoroughly and with plenty of liquids to avoid food impaction.  The indications, risks, and benefits of EGD were explained to the patient in detail. Risks include but are not limited to bleeding, perforation, adverse reaction to medications, and cardiopulmonary compromise. Sequelae include but are not limited to the possibility of surgery, hositalization, and mortality. The patient verbalized understanding and wished to proceed. All questions answered, referred to scheduler. Further recommendations pending results of the exam.    Shellia Cleverly, DO, FACG  03/20/2020, 2:09 PM   Monica Becton,*

## 2020-03-24 ENCOUNTER — Inpatient Hospital Stay (HOSPITAL_COMMUNITY): Admission: RE | Admit: 2020-03-24 | Payer: Managed Care, Other (non HMO) | Source: Ambulatory Visit

## 2020-03-24 ENCOUNTER — Other Ambulatory Visit: Payer: Self-pay

## 2020-03-24 ENCOUNTER — Encounter: Payer: Self-pay | Admitting: Gastroenterology

## 2020-03-24 ENCOUNTER — Ambulatory Visit (HOSPITAL_COMMUNITY)
Admission: RE | Admit: 2020-03-24 | Discharge: 2020-03-24 | Disposition: A | Discharge status: Home / Self Care | Payer: Managed Care, Other (non HMO) | Source: Ambulatory Visit | Attending: Gastroenterology | Admitting: Gastroenterology

## 2020-03-24 DIAGNOSIS — R1310 Dysphagia, unspecified: Secondary | ICD-10-CM | POA: Insufficient documentation

## 2020-03-24 DIAGNOSIS — K219 Gastro-esophageal reflux disease without esophagitis: Secondary | ICD-10-CM | POA: Diagnosis not present

## 2020-03-24 DIAGNOSIS — R1319 Other dysphagia: Secondary | ICD-10-CM

## 2020-04-05 ENCOUNTER — Other Ambulatory Visit: Payer: Self-pay | Admitting: Gastroenterology

## 2020-04-05 LAB — SARS CORONAVIRUS 2 (TAT 6-24 HRS): SARS Coronavirus 2: NEGATIVE

## 2020-04-07 ENCOUNTER — Other Ambulatory Visit: Payer: Self-pay

## 2020-04-07 ENCOUNTER — Ambulatory Visit (AMBULATORY_SURGERY_CENTER): Payer: Managed Care, Other (non HMO) | Admitting: Gastroenterology

## 2020-04-07 ENCOUNTER — Encounter: Payer: Self-pay | Admitting: Gastroenterology

## 2020-04-07 VITALS — BP 160/87 | HR 66 | Temp 97.4°F | Resp 17 | Ht 70.0 in | Wt 217.0 lb

## 2020-04-07 DIAGNOSIS — R1319 Other dysphagia: Secondary | ICD-10-CM | POA: Diagnosis not present

## 2020-04-07 DIAGNOSIS — K2970 Gastritis, unspecified, without bleeding: Secondary | ICD-10-CM | POA: Diagnosis not present

## 2020-04-07 DIAGNOSIS — K219 Gastro-esophageal reflux disease without esophagitis: Secondary | ICD-10-CM

## 2020-04-07 DIAGNOSIS — R12 Heartburn: Secondary | ICD-10-CM

## 2020-04-07 MED ORDER — SODIUM CHLORIDE 0.9 % IV SOLN: 500.0000 mL | Freq: Once | INTRAVENOUS | Status: DC

## 2020-04-07 NOTE — Progress Notes (Signed)
Called to room to assist during endoscopic procedure.  Patient ID and intended procedure confirmed with present staff. Received instructions for my participation in the procedure from the performing physician.  

## 2020-04-07 NOTE — Op Note (Signed)
[facility] Patient Name: Shaun CardWilliam Flitton Procedure Date: 04/07/2020 10:47 AM MRN: [id number] Endoscopist: Doristine LocksVito Nakeem Murnane , MD Age: [age] Referring MD:  Date of Birth: 05/06/1986 Gender: Male Account #: [id number] Procedure:                Upper GI endoscopy Indications:              Dysphagia, Heartburn, Suspected esophageal reflux,                            Abnormal UGI series, Abnormal MRI of the GI tract Medicines:                Monitored Anesthesia Care Procedure:                Pre-Anesthesia Assessment:                           - Prior to the procedure, a History and Physical                            was performed, and patient medications and                            allergies were reviewed. The patient's tolerance of                            previous anesthesia was also reviewed. The risks                            and benefits of the procedure and the sedation                            options and risks were discussed with the patient.                            All questions were answered, and informed consent                            was obtained. Prior Anticoagulants: The patient has                            taken no previous anticoagulant or antiplatelet                            agents. ASA Grade Assessment: II - A patient with                            mild systemic disease. After reviewing the risks                            and benefits, the patient was deemed in                            satisfactory condition to undergo the procedure.  After obtaining informed consent, the endoscope was                            passed under direct vision. Throughout the                            procedure, the patient's blood pressure, pulse, and                            oxygen saturations were monitored continuously. The                            Endoscope was introduced through the mouth, and                             advanced to the second part of duodenum. The upper                            GI endoscopy was accomplished without difficulty.                            The patient tolerated the procedure well. Scope In: Scope Out: Findings:                 There was a focal area of pulsatile extraluminal                            compression noted in the proximal esophagus at 26                            cm from the incisors. This was easily traversed.                            The overlying mucosa was normal appearing. The                            examined esophagus was normal. No ulcers or mucosal                            erythema/edema noted. The scope was withdrawn.                            Empiric dilation was performed with a Maloney                            dilator with no resistance at 54 Fr. The dilation                            site was examined following endoscope reinsertion                            and showed no bleeding, mucosal tear or  perforation. Estimated blood loss: none.                           The Z-line was regular and was found 43 cm from the                            incisors.                           Localized mild inflammation characterized by                            erythema was found in the gastric body. Biopsies                            were taken with a cold forceps for Helicobacter                            pylori testing. Estimated blood loss was minimal.                           The cardia, incisura, gastric antrum and pylorus                            were normal.                           The examined duodenum was normal. Complications:            No immediate complications. Estimated Blood Loss:     Estimated blood loss was minimal. Impression:               - Focal area of pulsatile extraluminal compression                            noted in the proximal esophagus at 26 cm from the                             incisors. Query dysphagia lusoria.                           - Otherwise, the entire esophagus was normal.                            Dilated with 54 Fr Maloney dilator.                           - Z-line regular, 43 cm from the incisors.                           - Gastritis. Biopsied.                           - Normal cardia, incisura, antrum and pylorus.                           -  Normal examined duodenum. Recommendation:           - Patient has a contact number available for                            emergencies. The signs and symptoms of potential                            delayed complications were discussed with the                            patient. Return to normal activities tomorrow.                            Written discharge instructions were provided to the                            patient.                           - Resume previous diet.                           - Continue present medications.                           - Await pathology results.                           - Repeat upper endoscopy PRN.                           - If ongoing symptoms, consider CT neck/chest with                            contrast to evaluate for aberant right subclavian                            artery. Doristine Locks, MD 04/07/2020 11:21:20 AM

## 2020-04-07 NOTE — Progress Notes (Signed)
VS by CW  Pt's states no medical or surgical changes since previsit or office visit.  

## 2020-04-07 NOTE — Patient Instructions (Signed)
Handout s given:  Gastritis Resume previous diet continue present medications Await pathology results If symptoms continue, consider CT of neck/chest with contrast  YOU HAD AN ENDOSCOPIC PROCEDURE TODAY AT THE  ENDOSCOPY CENTER:   Refer to the procedure report that was given to you for any specific questions about what was found during the examination.  If the procedure report does not answer your questions, please call your gastroenterologist to clarify.  If you requested that your care partner not be given the details of your procedure findings, then the procedure report has been included in a sealed envelope for you to review at your convenience later.  YOU SHOULD EXPECT: Some feelings of bloating in the abdomen. Passage of more gas than usual.  Walking can help get rid of the air that was put into your GI tract during the procedure and reduce the bloating. If you had a lower endoscopy (such as a colonoscopy or flexible sigmoidoscopy) you may notice spotting of blood in your stool or on the toilet paper. If you underwent a bowel prep for your procedure, you may not have a normal bowel movement for a few days.  Please Note:  You might notice some irritation and congestion in your nose or some drainage.  This is from the oxygen used during your procedure.  There is no need for concern and it should clear up in a day or so.  SYMPTOMS TO REPORT IMMEDIATELY:  Following upper endoscopy (EGD)  Vomiting of blood or coffee ground material  New chest pain or pain under the shoulder blades  Painful or persistently difficult swallowing  New shortness of breath  Fever of 100F or higher  Black, tarry-looking stools  For urgent or emergent issues, a gastroenterologist can be reached at any hour by calling [phone]. Do not use MyChart messaging for urgent concerns.    DIET:  We do recommend a small meal at first, but then you may proceed to your regular diet.  Drink plenty of fluids but  you should avoid alcoholic beverages for 24 hours.  ACTIVITY:  You should plan to take it easy for the rest of today and you should NOT DRIVE or use heavy machinery until tomorrow (because of the sedation medicines used during the test).    FOLLOW UP: Our staff will call the number listed on your records 48-72 hours following your procedure to check on you and address any questions or concerns that you may have regarding the information given to you following your procedure. If we do not reach you, we will leave a message.  We will attempt to reach you two times.  During this call, we will ask if you have developed any symptoms of COVID 19. If you develop any symptoms (ie: fever, flu-like symptoms, shortness of breath, cough etc.) before then, please call [phone].  If you test positive for Covid 19 in the 2 weeks post procedure, please call and report this information to [place].    If any biopsies were taken you will be contacted by phone or by letter within the next 1-3 weeks.  Please call us at [phone] if you have not heard about the biopsies in 3 weeks.    SIGNATURES/CONFIDENTIALITY: You and/or your care partner have signed paperwork which will be entered into your electronic medical record.  These signatures attest to the fact that that the information above on your After Visit Summary has been reviewed and is understood.  Full responsibility of the confidentiality of  this discharge information lies with you and/or your care-partner.

## 2020-04-07 NOTE — Progress Notes (Signed)
A and O x3. Report to RN. Tolerated MAC anesthesia well.Teeth unchanged after procedure.

## 2020-04-11 ENCOUNTER — Telehealth: Payer: Self-pay

## 2020-04-11 NOTE — Telephone Encounter (Signed)
  Follow up Call-  Call back number 04/07/2020  Post procedure Call Back phone  # [phone]  Permission to leave phone message Yes  Some recent data might be hidden     Patient questions:  Do you have a fever, pain , or abdominal swelling? No. Pain Score  0 *  Have you tolerated food without any problems? Yes.    Have you been able to return to your normal activities? Yes.    Do you have any questions about your discharge instructions: Diet   No. Medications  No. Follow up visit  No.  Do you have questions or concerns about your Care? No.  Actions: * If pain score is 4 or above: 1. No action needed, pain <4.Have you developed a fever since your procedure? no  2.   Have you had an respiratory symptoms (SOB or cough) since your procedure? no  3.   Have you tested positive for COVID 19 since your procedure no  4.   Have you had any family members/close contacts diagnosed with the COVID 19 since your procedure?  no   If yes to any of these questions please route to Laverna Peace, RN and Karlton Lemon, RN

## 2020-04-17 ENCOUNTER — Encounter: Payer: Self-pay | Admitting: Gastroenterology

## 2020-04-21 DIAGNOSIS — R200 Anesthesia of skin: Secondary | ICD-10-CM

## 2020-04-21 DIAGNOSIS — E291 Testicular hypofunction: Secondary | ICD-10-CM

## 2020-04-21 DIAGNOSIS — R202 Paresthesia of skin: Secondary | ICD-10-CM

## 2020-04-24 MED ORDER — PREGABALIN 300 MG PO CAPS: 300.0000 mg | ORAL_CAPSULE | Freq: Two times a day (BID) | ORAL | 3 refills | Status: DC

## 2020-04-24 MED ORDER — TESTOSTERONE CYPIONATE 200 MG/ML IM SOLN: 200.0000 mg | INTRAMUSCULAR | 3 refills | Status: DC

## 2020-04-24 MED ORDER — OMEPRAZOLE 40 MG PO CPDR: 40.0000 mg | DELAYED_RELEASE_CAPSULE | Freq: Two times a day (BID) | ORAL | 3 refills | Status: DC

## 2020-04-27 NOTE — Telephone Encounter (Signed)
Please look into this, I am not sure what they need, is it in order?

## 2020-05-08 ENCOUNTER — Other Ambulatory Visit: Payer: Self-pay | Admitting: Sports Medicine

## 2020-05-08 DIAGNOSIS — Z981 Arthrodesis status: Secondary | ICD-10-CM

## 2020-05-12 ENCOUNTER — Ambulatory Visit (INDEPENDENT_AMBULATORY_CARE_PROVIDER_SITE_OTHER): Payer: Managed Care, Other (non HMO)

## 2020-05-12 ENCOUNTER — Ambulatory Visit (INDEPENDENT_AMBULATORY_CARE_PROVIDER_SITE_OTHER): Payer: Managed Care, Other (non HMO) | Admitting: Sports Medicine

## 2020-05-12 ENCOUNTER — Other Ambulatory Visit: Payer: Self-pay

## 2020-05-12 DIAGNOSIS — M25474 Effusion, right foot: Secondary | ICD-10-CM | POA: Diagnosis not present

## 2020-05-12 NOTE — Progress Notes (Signed)
    Procedures performed today:    Procedure: Real-time Ultrasound Guided injection of the right first MTP Device: Samsung HS60  Verbal informed consent obtained.  Time-out conducted.  Noted no overlying erythema, induration, or other signs of local infection.  Skin prepped in a sterile fashion.  Local anesthesia: Topical Ethyl chloride.  With sterile technique and under real time ultrasound guidance:  Noted a bit of synovitis, 1/2 cc lidocaine, 1/2 cc kenalog 40 injected easily.   Completed without difficulty  Advised to call if fevers/chills, erythema, induration, drainage, or persistent bleeding.  Images permanently stored and available for review in PACS.  Impression: Technically successful ultrasound guided injection.  Independent interpretation of notes and tests performed by another provider:   None.  Brief History, Exam, Impression, and Recommendations:    Swelling of first metatarsophalangeal (MTP) joint of right foot This is a very pleasant 34 year old male, he has tried multiple modalities including carbon fiber Morton's plate, orthotics with first metatarsal ray posting, NSAIDs, activity modification. Still has some pain, ultrasound did show some synovitis of the first MTP so this was injected today, return to see me on an as-needed basis. Of note he was approved for full disability.    ___________________________________________ [name]. Benjamin Stain, M.D., ABFM., CAQSM. Primary Care and Sports Medicine [facility]  Adjunct Instructor of Family Medicine  [employer]

## 2020-05-12 NOTE — Assessment & Plan Note (Signed)
This is a very pleasant 35 year old male, he has tried multiple modalities including carbon fiber Morton's plate, orthotics with first metatarsal ray posting, NSAIDs, activity modification. Still has some pain, ultrasound did show some synovitis of the first MTP so this was injected today, return to see me on an as-needed basis. Of note he was approved for full disability.

## 2020-06-23 MED ORDER — "BD HYPODERMIC NEEDLE 18G X 1"" MISC": 99 refills | Status: AC

## 2020-06-23 MED ORDER — "BD LUER-LOK SYRINGE 22G X 1-1/2"" 3 ML MISC": 99 refills | Status: AC

## 2020-07-20 ENCOUNTER — Ambulatory Visit (INDEPENDENT_AMBULATORY_CARE_PROVIDER_SITE_OTHER): Payer: Managed Care, Other (non HMO) | Admitting: Sports Medicine

## 2020-07-20 ENCOUNTER — Other Ambulatory Visit: Payer: Self-pay

## 2020-07-20 DIAGNOSIS — Z0271 Encounter for disability determination: Secondary | ICD-10-CM

## 2020-07-20 NOTE — Assessment & Plan Note (Signed)
Shaun Camacho returns, we filled out disability paperwork today certifying his disability from the [facility] office. Paperwork was copied, faxed, scanned, and original returned to patient. Return as needed.

## 2020-07-20 NOTE — Progress Notes (Signed)
    Procedures performed today:    None.  Independent interpretation of notes and tests performed by another provider:   None.  Brief History, Exam, Impression, and Recommendations:    Encounter for disability determination [name] returns, we filled out disability paperwork today certifying his disability from the [facility] office. Paperwork was copied, faxed, scanned, and original returned to patient. Return as needed.    ___________________________________________ [name]. Benjamin Stain, M.D., ABFM., CAQSM. Primary Care and Sports Medicine [facility]  Adjunct Instructor of Family Medicine  [employer]

## 2020-08-25 ENCOUNTER — Telehealth: Payer: Self-pay

## 2020-08-25 DIAGNOSIS — R200 Anesthesia of skin: Secondary | ICD-10-CM

## 2020-08-25 DIAGNOSIS — R202 Paresthesia of skin: Secondary | ICD-10-CM

## 2020-08-25 MED ORDER — PREGABALIN 300 MG PO CAPS: 300.0000 mg | ORAL_CAPSULE | Freq: Two times a day (BID) | ORAL | 3 refills | Status: DC

## 2020-08-25 NOTE — Telephone Encounter (Signed)
[facility] called stated pt wants his Lyrica sent to them instead of [facility].  Called [facility] and canceled prescription.

## 2020-08-25 NOTE — Telephone Encounter (Signed)
No problem, switched to [facility].

## 2020-08-30 NOTE — Telephone Encounter (Signed)
Probably needs Risperdal but I do think we need at least a virtual visit for PHQ/GAD and more in-depth evaluation of these anger issues.

## 2020-08-31 ENCOUNTER — Encounter: Payer: Self-pay | Admitting: Sports Medicine

## 2020-08-31 ENCOUNTER — Other Ambulatory Visit: Payer: Self-pay

## 2020-08-31 ENCOUNTER — Ambulatory Visit (INDEPENDENT_AMBULATORY_CARE_PROVIDER_SITE_OTHER): Payer: Managed Care, Other (non HMO) | Admitting: Sports Medicine

## 2020-08-31 ENCOUNTER — Ambulatory Visit (INDEPENDENT_AMBULATORY_CARE_PROVIDER_SITE_OTHER): Payer: Managed Care, Other (non HMO)

## 2020-08-31 DIAGNOSIS — F32A Depression, unspecified: Secondary | ICD-10-CM | POA: Diagnosis not present

## 2020-08-31 DIAGNOSIS — F419 Anxiety disorder, unspecified: Secondary | ICD-10-CM | POA: Diagnosis not present

## 2020-08-31 DIAGNOSIS — M7702 Medial epicondylitis, left elbow: Secondary | ICD-10-CM

## 2020-08-31 MED ORDER — ALPRAZOLAM 0.5 MG PO TABS: 0.5000 mg | ORAL_TABLET | Freq: Three times a day (TID) | ORAL | 0 refills | Status: DC | PRN

## 2020-08-31 MED ORDER — SERTRALINE HCL 50 MG PO TABS: ORAL_TABLET | ORAL | 3 refills | Status: DC

## 2020-08-31 NOTE — Progress Notes (Signed)
    Procedures performed today:    Procedure: Real-time Ultrasound Guided injection of the left common flexor tendon origin Device: Samsung HS60  Verbal informed consent obtained.  Time-out conducted.  Noted no overlying erythema, induration, or other signs of local infection.  Skin prepped in a sterile fashion.  Local anesthesia: Topical Ethyl chloride.  With sterile technique and under real time ultrasound guidance:  Shaun Camacho is post ulnar nerve transposition so we took great care to avoid injections around the transposed ulnar nerve, this was visualized just superficial to the common flexor tendon origin.  I also noted mild tendinosis of the common flexor tendon, 1 cc Kenalog 40, 1 cc lidocaine, 1 cc bupivacaine injected easily Completed without difficulty  Advised to call if fevers/chills, erythema, induration, drainage, or persistent bleeding.  Images permanently stored and available for review in PACS.  Impression: Technically successful ultrasound guided injection.  Independent interpretation of notes and tests performed by another provider:   None.  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 He does have some mild PTSD symptoms that are few and far between. I think this is all related to his anxiety/depression. No suicidal or homicidal ideation. I would like him to do some behavioral therapy. Adding Zoloft 50 mg daily, alprazolam 0.5 to be used as needed, return to see me in 4 to 6 weeks for repeat PHQ and GAD.  Medial epicondylitis, left Repeat common flexor tendon injection today, this was last done approximately 7 or 8 months ago.    ___________________________________________ Shaun Camacho. Shaun Camacho, M.D., ABFM., CAQSM. Primary Care and Sports Medicine [facility]  Adjunct Instructor of Family Medicine  [employer]

## 2020-08-31 NOTE — Assessment & Plan Note (Addendum)
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 He does have some mild PTSD symptoms that are few and far between. I think this is all related to his anxiety/depression. No suicidal or homicidal ideation. I would like him to do some behavioral therapy. Adding Zoloft 50 mg daily, alprazolam 0.5 to be used as needed, return to see me in 4 to 6 weeks for repeat PHQ and GAD.

## 2020-08-31 NOTE — Assessment & Plan Note (Signed)
Repeat common flexor tendon injection today, this was last done approximately 7 or 8 months ago.

## 2020-09-13 ENCOUNTER — Other Ambulatory Visit: Payer: Self-pay

## 2020-09-13 DIAGNOSIS — Z981 Arthrodesis status: Secondary | ICD-10-CM

## 2020-09-13 MED ORDER — AMITRIPTYLINE HCL 50 MG PO TABS: ORAL_TABLET | ORAL | 3 refills | Status: DC

## 2020-09-13 NOTE — Telephone Encounter (Signed)
Call from [place] at [facility] who stated that patient was switching his prescriptions to their pharmacy and his amitriptyline did not have refills on it.

## 2020-09-14 ENCOUNTER — Other Ambulatory Visit: Payer: Self-pay | Admitting: Sports Medicine

## 2020-09-14 DIAGNOSIS — Z981 Arthrodesis status: Secondary | ICD-10-CM

## 2020-09-14 MED ORDER — AMITRIPTYLINE HCL 50 MG PO TABS: 50.0000 mg | ORAL_TABLET | Freq: Every day | ORAL | 3 refills | Status: DC

## 2020-09-19 ENCOUNTER — Other Ambulatory Visit: Payer: Self-pay | Admitting: Sports Medicine

## 2020-09-19 DIAGNOSIS — L309 Dermatitis, unspecified: Secondary | ICD-10-CM

## 2020-09-19 MED ORDER — CLOBETASOL PROPIONATE 0.05 % EX CREA: 1.0000 "application " | TOPICAL_CREAM | Freq: Two times a day (BID) | CUTANEOUS | 2 refills | Status: AC

## 2020-09-28 ENCOUNTER — Other Ambulatory Visit: Payer: Self-pay

## 2020-09-28 ENCOUNTER — Ambulatory Visit (INDEPENDENT_AMBULATORY_CARE_PROVIDER_SITE_OTHER): Payer: Managed Care, Other (non HMO) | Admitting: Sports Medicine

## 2020-09-28 ENCOUNTER — Encounter: Payer: Self-pay | Admitting: Sports Medicine

## 2020-09-28 DIAGNOSIS — F419 Anxiety disorder, unspecified: Secondary | ICD-10-CM | POA: Diagnosis not present

## 2020-09-28 DIAGNOSIS — M7702 Medial epicondylitis, left elbow: Secondary | ICD-10-CM

## 2020-09-28 DIAGNOSIS — Z981 Arthrodesis status: Secondary | ICD-10-CM | POA: Diagnosis not present

## 2020-09-28 DIAGNOSIS — F32A Depression, unspecified: Secondary | ICD-10-CM | POA: Diagnosis not present

## 2020-09-28 MED ORDER — SERTRALINE HCL 100 MG PO TABS: 100.0000 mg | ORAL_TABLET | Freq: Every day | ORAL | 3 refills | Status: DC

## 2020-09-28 NOTE — Assessment & Plan Note (Signed)
Shaun Camacho is post C5-C7 ACDF with Dr. Yevette Edwards, he has been referred to Dr. Regino Schultze for consideration of greater occipital nerve injection for persistent occipital headaches and neck pain. We will follow along.

## 2020-09-28 NOTE — Progress Notes (Signed)
    Procedures performed today:    None.  Independent interpretation of notes and tests performed by another provider:   None.  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 He is now doing behavioral therapy, we added Zoloft 50 at the last visit and alprazolam to be used as needed. He overall has improved to some degree, his PHQ-9 and GAD scores have improved, we will go up to 100 mg on Zoloft, he does understand that 4 to 6 weeks is necessary to see efficacy after medication dose changed. Return to see me in 6 weeks repeat PHQ and GAD and reevaluation.  Medial epicondylitis, left 1 month ago I performed a common flexor tendon injection, he returns today pain-free.  History of fusion of cervical spine Shaun Camacho is post C5-C7 ACDF with Dr. Yevette Edwards, he has been referred to Dr. Regino Schultze for consideration of greater occipital nerve injection for persistent occipital headaches and neck pain. We will follow along.    ___________________________________________ Shaun Camacho. Shaun Camacho, M.D., ABFM., CAQSM. Primary Care and Sports Medicine [facility]  Adjunct Instructor of Family Medicine  [employer]

## 2020-09-28 NOTE — Assessment & Plan Note (Signed)
1 month ago I performed a common flexor tendon injection, he returns today pain-free.

## 2020-09-28 NOTE — Assessment & Plan Note (Signed)
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 He is now doing behavioral therapy, we added Zoloft 50 at the last visit and alprazolam to be used as needed. He overall has improved to some degree, his PHQ-9 and GAD scores have improved, we will go up to 100 mg on Zoloft, he does understand that 4 to 6 weeks is necessary to see efficacy after medication dose changed. Return to see me in 6 weeks repeat PHQ and GAD and reevaluation.

## 2020-10-05 ENCOUNTER — Ambulatory Visit: Payer: Managed Care, Other (non HMO) | Admitting: Sports Medicine

## 2020-10-06 ENCOUNTER — Ambulatory Visit: Payer: 59 | Admitting: Psychology

## 2020-10-17 DIAGNOSIS — E291 Testicular hypofunction: Secondary | ICD-10-CM

## 2020-10-17 MED ORDER — TESTOSTERONE CYPIONATE 200 MG/ML IM SOLN: 200.0000 mg | INTRAMUSCULAR | 3 refills | Status: DC

## 2020-10-25 ENCOUNTER — Ambulatory Visit: Payer: Managed Care, Other (non HMO) | Admitting: Sports Medicine

## 2020-10-26 ENCOUNTER — Ambulatory Visit (INDEPENDENT_AMBULATORY_CARE_PROVIDER_SITE_OTHER): Payer: Managed Care, Other (non HMO)

## 2020-10-26 ENCOUNTER — Other Ambulatory Visit: Payer: Self-pay

## 2020-10-26 ENCOUNTER — Ambulatory Visit (INDEPENDENT_AMBULATORY_CARE_PROVIDER_SITE_OTHER): Payer: Managed Care, Other (non HMO) | Admitting: Sports Medicine

## 2020-10-26 DIAGNOSIS — M25474 Effusion, right foot: Secondary | ICD-10-CM | POA: Diagnosis not present

## 2020-10-26 DIAGNOSIS — F32A Depression, unspecified: Secondary | ICD-10-CM | POA: Diagnosis not present

## 2020-10-26 DIAGNOSIS — M7702 Medial epicondylitis, left elbow: Secondary | ICD-10-CM

## 2020-10-26 DIAGNOSIS — M79671 Pain in right foot: Secondary | ICD-10-CM

## 2020-10-26 DIAGNOSIS — F419 Anxiety disorder, unspecified: Secondary | ICD-10-CM | POA: Diagnosis not present

## 2020-10-26 MED ORDER — ESCITALOPRAM OXALATE 10 MG PO TABS: ORAL_TABLET | ORAL | 3 refills | Status: DC

## 2020-10-26 MED ORDER — SERTRALINE HCL 25 MG PO TABS: ORAL_TABLET | ORAL | 0 refills | Status: DC

## 2020-10-26 NOTE — Assessment & Plan Note (Signed)
Shaun Camacho unfortunately continues to have pain in his medial elbow, he is post ulnar nerve transposition here, back in February we did a common flexor tendon injection, he had initial short relief but now has a recurrence of symptoms. PRP is an option, we will give him some information on this however I would like him to see Dr. Janee Morn again to discuss surgical modalities considering prior operative intervention. He does understand he would need to discontinue NSAIDs at least a week before starting PRP. He is also currently on daily hydrocodone.

## 2020-10-26 NOTE — Patient Instructions (Signed)
? ?Platelet-rich plasma is used in musculoskeletal medicine to focus your own body?s ability to ?heal. It has several well-done published randomized control trials (RCT) which demonstrate both ?its effectiveness and safety in many musculoskeletal conditions, including osteoarthritis, ?tendinopathies, and damaged vertebral discs. PRP has been in clinical use since the 1990?s. ?Many people know that platelets form a clot if there is a cut in the skin. It turns out that ?platelets do not only form a clot, they also start the body?s own repair process. When platelets ?activate to form a clot, they also release alpha granules which have hundreds of chemical ?messengers in them that initiate and organize repair to the damaged tissue. Precisely placing ?PRP into the site of injury will initiate the healing process by activating on the damaged ?cartilage or tendon. This is an inflammatory process, and inflammation is the vital first phase of ?Healing. ? ?What to expect and how to prepare for PRP ? ? 2 weeks prior to the procedure: depending on the procedure, you may need to arrange ?for a driver to bring you home. IF you are having a lower extremity procedure, we can provide crutches ?as needed. ? ? 7 days prior to the procedure: Stop taking anti-inflammatory drugs like ibuprofen, ?Naprosyn, Celebrex, or Meloxicam. Let Dr. Anthoni Geerts know if you have been taking prednisone or other ?corticosteroids in the last month. ? ? The day before the procedure: thoroughly shower and clean your skin.  ? ? The day of the procedure: Wear loose-fitting clothing like sweatpants or shorts. If you ?are having an upper body procedure wear a top that can button or zip up. ? ?PRP will initiate healing and a productive inflammation, and PRP therapy will make the body ?part treated sore for 4 days to two weeks. Anti-inflammatory drugs (i.e. ibuprofen, Naprosyn, ?Celebrex) and corticosteroids such as prednisone can blunt or stop this process,  so it is ?important to not take any anti-inflammatory drugs for 7 days before getting PRP therapy, or for ?at least three weeks after PRP therapy. Corticosteroid injections can blunt inflammation for 30 ?days, so let us know if you have had one recently. Depending on the body part injected, you ?may be in a sling or on crutches for several days. Just like wringing out a wet dishcloth, if you ?load or tense a tendon or ligament that has just been injected with PRP, some of the PRP ?injected will squish out. By keeping the body part treated relaxed by using a sling (for the ?shoulder or arm) or crutches (for hips and legs) for a few days, the PRP can bind in place and do ?its job.  ? ?You may need a driver to bring you home.  ?Tobacco/nicotine is a potent toxin and its use constricts small blood vessels which are needed for tissue repair.  ?Tobacco/nicotine use will limit the effectiveness of any treatment and stopping tobacco use is one of the single  ?greatest actions you can take to improve your health. Avoid toxins like alcohol, which inhibits and depresses the ?cells needed for tissue repair. ? ?What happens during the PRP procedure? ? ?Platelet rich plasma is made by taking some of your blood and performing a two-stage ?centrifuge process on it to concentrate the PRP. First, your blood is drawn into a syringe with a ?small amount of anti-coagulant in it (this is to keep the blood from clotting during this process). ?The amount of blood drawn is usually about 10-30 milliliters, depending on how much PRP is needed for   the treatment.  ?(There are 355 milliliters in a 12-ounce soda can for comparison).  ?Then the blood is transferred in a sterile fashion into a ?centrifuge tube. It is then centrifuged for the first cycle where the red blood cells are isolated ?and discarded. In the second centrifuge cycle, the platelet-rich fraction of the remaining plasma ?is concentrated and placed in a syringe. The skin at the  injection site is numbed with a small ?amount of topical cooling spray. Dr Tranice Laduke will then precisely inject the PRP ?into the injury site using ultrasound guidance. ? ?What to do after your procedure ? ?I will give you specific medicine to control any discomfort you may have after the procedure. ?Avoid NSAIDs like ibuprofen. ?Acetaminophen can be used for mild pain.  ?Depending on the part of the body ?treated, usually you will be placed in a sling or on crutches for 1 to 3 days. Do your best not to ?tense or load the treated area during this time. After 3 days, unless otherwise instructed, the ?treated body part should be used and slowly moved through its full range of motion. It will be ?sore, but you will not be doing damage by moving it, in fact it needs to move to heal. If you ?were on crutches for a period of time, walking is ok once you are off the crutches. For now, ?avoid activities that specifically hurt you before being treated. Exercise is vital to good health ?and finding a way to cross train around your injury is important not only for your physical ?health, but for your mental health as well. Ask me about cross training options for your injury. ?Some brief (10 minutes or less) period of heat or ice therapy will not hurt the therapy, but it is ?not required. Usually, depending on the initial injury, physical therapy is started from two ?weeks to four weeks after injection. Improvements in pain and function should be expected ?from 8 weeks to 12 weeks after injection and some injuries may require more than one ?treatment.  ? ? ?___________________________________________ ?Mackie Goon J. Yarenis Cerino, M.D., ABFM., CAQSM. ?Primary Care and Sports Medicine ?[facility]  ? ?Adjunct Professor of Family Medicine  ?[employer] ? ?

## 2020-10-26 NOTE — Progress Notes (Signed)
    Procedures performed today:    Procedure: Real-time Ultrasound Guided injection of the right first MTP Device: Samsung HS60  Verbal informed consent obtained.  Time-out conducted.  Noted no overlying erythema, induration, or other signs of local infection.  Skin prepped in a sterile fashion.  Local anesthesia: Topical Ethyl chloride.  With sterile technique and under real time ultrasound guidance:  Noted mild to moderate synovitis, 1/2 cc kenalog 40, 1/2 cc lidocaine injected easily.   Completed without difficulty  Advised to call if fevers/chills, erythema, induration, drainage, or persistent bleeding.  Images permanently stored and available for review in PACS.  Impression: Technically successful ultrasound guided injection.  Independent interpretation of notes and tests performed by another provider:   None.  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 We started him on Zoloft with an up taper at this point to 100 mg without sufficient improvement in his symptoms. We will down taper him from Zoloft and switch to Lexapro. He has more irritability and frustration from not being able to control his symptoms and really does not have any episodes of mania or hypomania to suggest bipolar disorder. Once we taper up Lexapro, if he has not noted sufficient improvement we will probably augment with Abilify. Return to see me in 6 weeks for this.  Medial epicondylitis, left [name] unfortunately continues to have pain in his medial elbow, he is post ulnar nerve transposition here, back in February we did a common flexor tendon injection, he had initial short relief but now has a recurrence of symptoms. PRP is an option, we will give him some information on  this however I would like him to see Dr. Janee Morn again to discuss surgical modalities considering prior operative intervention. He does understand he would need to discontinue NSAIDs at least a week before starting PRP. He is also currently on daily hydrocodone.  Swelling of first metatarsophalangeal (MTP) joint of right foot First MTP swelling, pain, carbon fiber Morton's plate was not helpful, orthotics with first metatarsal ray posting was not helpful, NSAIDs, activity modification not helpful. We last injected this in November of last year and he did well. Repeat right first MTP injection, we are also going to get a second opinion from podiatry. Ordering some updated x-rays as well.    ___________________________________________ [name]. Benjamin Stain, M.D., ABFM., CAQSM. Primary Care and Sports Medicine [facility]  Adjunct Instructor of Family Medicine  [employer]

## 2020-10-26 NOTE — Assessment & Plan Note (Signed)
First MTP swelling, pain, carbon fiber Morton's plate was not helpful, orthotics with first metatarsal ray posting was not helpful, NSAIDs, activity modification not helpful. We last injected this in November of last year and he did well. Repeat right first MTP injection, we are also going to get a second opinion from podiatry. Ordering some updated x-rays as well.

## 2020-10-26 NOTE — Assessment & Plan Note (Signed)
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 We started him on Zoloft with an up taper at this point to 100 mg without sufficient improvement in his symptoms. We will down taper him from Zoloft and switch to Lexapro. He has more irritability and frustration from not being able to control his symptoms and really does not have any episodes of mania or hypomania to suggest bipolar disorder. Once we taper up Lexapro, if he has not noted sufficient improvement we will probably augment with Abilify. Return to see me in 6 weeks for this.

## 2020-11-09 ENCOUNTER — Ambulatory Visit: Payer: Managed Care, Other (non HMO) | Admitting: Sports Medicine

## 2020-11-10 ENCOUNTER — Ambulatory Visit: Payer: Managed Care, Other (non HMO) | Admitting: Podiatry

## 2020-11-13 ENCOUNTER — Other Ambulatory Visit: Payer: Self-pay | Admitting: Sports Medicine

## 2020-11-13 DIAGNOSIS — F32A Depression, unspecified: Secondary | ICD-10-CM

## 2020-11-14 ENCOUNTER — Ambulatory Visit (INDEPENDENT_AMBULATORY_CARE_PROVIDER_SITE_OTHER): Payer: Managed Care, Other (non HMO) | Admitting: Podiatry

## 2020-11-14 ENCOUNTER — Ambulatory Visit (INDEPENDENT_AMBULATORY_CARE_PROVIDER_SITE_OTHER): Payer: Managed Care, Other (non HMO)

## 2020-11-14 ENCOUNTER — Other Ambulatory Visit: Payer: Self-pay

## 2020-11-14 DIAGNOSIS — M21611 Bunion of right foot: Secondary | ICD-10-CM | POA: Diagnosis not present

## 2020-11-14 DIAGNOSIS — M79671 Pain in right foot: Secondary | ICD-10-CM | POA: Diagnosis not present

## 2020-11-14 DIAGNOSIS — M778 Other enthesopathies, not elsewhere classified: Secondary | ICD-10-CM

## 2020-11-14 DIAGNOSIS — M722 Plantar fascial fibromatosis: Secondary | ICD-10-CM | POA: Diagnosis not present

## 2020-11-14 NOTE — Patient Instructions (Signed)

## 2020-11-15 ENCOUNTER — Other Ambulatory Visit: Payer: Self-pay | Admitting: Podiatry

## 2020-11-15 DIAGNOSIS — M722 Plantar fascial fibromatosis: Secondary | ICD-10-CM

## 2020-11-16 NOTE — Progress Notes (Signed)
Subjective:   Patient ID: Shaun Camacho, male   DOB: 35 y.o.   MRN: [id number]   HPI 35 year old male presents the office today for concerns of pain in his right foot.  He points on the bunion area which is discomfort he points on the medial aspect as well as the sesamoids where he gets discomfort.  This is been ongoing issue for some time but has been getting worse.  He says at times it does hurt to bend the joint he can feel a crunching sensation at times when trying to move the joint.  He has not had any recent injury.  He has tried shoe modifications, offloading without any improvement.  No other concerns.   Review of Systems  All other systems reviewed and are negative.  Past Medical History:  Diagnosis Date  . Anxiety and depression 08/23/2016   08/23/2016 PHQ9 = 6, GAD7 = 7 09/12/2016 PHQ9 = 6, GAD7 = 3 10/11/2016 PHQ9 = 1, GAD7 = 1 12/27/2016 PHQ9 = 3, GAD7 = 4 01/24/2017 PHQ9 = 2, GAD7 = 5  . Arthritis   . Chronic headache   . Complication of anesthesia    when wakes up gets agitated   . GERD (gastroesophageal reflux disease) 12/29/2015  . Male hypogonadism 01/12/2016  . Radiculitis of right cervical region 03/22/2016   MRI from [facility] shows moderate to severe right C6-C7 neuroforaminal stenosis with mass-effect on the exiting right C7 nerve root.  . Sleep apnea    uses cpap    Past Surgical History:  Procedure Laterality Date  . ANTERIOR CERVICAL DECOMP/DISCECTOMY FUSION N/A 07/15/2018   Procedure: ANTERIOR CERVICAL DECOMPRESSION FUSION, CERVICAL FIVE-SIX, CERVICAL SIX-SEVEN WITH INSTRUMENTATION AND ALLOGRAFT;  Surgeon: Estill Bamberg, MD;  Location: [facility] OR;  Service: Orthopedics;  Laterality: N/A;  . CARPAL TUNNEL RELEASE Bilateral    07/2019, 11/2019  . LATERAL EPICONDYLE RELEASE Right   . PILONIDAL CYST EXCISION N/A 09/03/2018   Procedure: EXCISION OF PILONIDAL DISEASE;  Surgeon: Karie Soda, MD;  Location: [facility] ORS;  Service: General;  Laterality: N/A;  . SURGERY  SCROTAL / TESTICULAR    . ULNAR NERVE TRANSPOSITION Left 02/28/2020   Procedure: LEFT ULNAR NEUROPLASTY AT HE ELBOW;  Surgeon: Mack Hook, MD;  Location: [facility];  Service: Orthopedics;  Laterality: Left;     Current Outpatient Medications:  .  acetaminophen (TYLENOL) 325 MG tablet, Take 2 tablets (650 mg total) by mouth every 6 (six) hours., Disp: , Rfl:  .  ALPRAZolam (XANAX) 0.5 MG tablet, Take 1 tablet (0.5 mg total) by mouth 3 (three) times daily as needed for anxiety., Disp: 15 tablet, Rfl: 0 .  amitriptyline (ELAVIL) 50 MG tablet, Take 1 tablet (50 mg total) by mouth at bedtime., Disp: 90 tablet, Rfl: 3 .  clobetasol cream (TEMOVATE) 0.05 %, Apply 1 application topically 2 (two) times daily., Disp: 60 g, Rfl: 2 .  DUEXIS 800-26.6 MG TABS, TAKE 1 TABLET BY MOUTH 1 TIME EVERY 12 HOURS AS NEEDED, Disp: , Rfl:  .  ENBREL SURECLICK 50 MG/ML injection, Inject into the skin once a week., Disp: , Rfl:  .  escitalopram (LEXAPRO) 10 MG tablet, One half tab daily for a week then 1 tab p.o. daily, Disp: 30 tablet, Rfl: 3 .  HYDROcodone-acetaminophen (NORCO) 7.5-325 MG tablet, SMARTSIG:1 Tablet(s) By Mouth Every 12 Hours PRN, Disp: , Rfl:  .  NEEDLE, DISP, 18 G (BD HYPODERMIC NEEDLE) 18G X 1" MISC, USE DAILY AS DIRECTED, Disp: 10 each,  Rfl: prn .  omeprazole (PRILOSEC) 40 MG capsule, Take 1 capsule (40 mg total) by mouth 2 (two) times daily with a meal., Disp: 180 capsule, Rfl: 3 .  pregabalin (LYRICA) 300 MG capsule, Take 1 capsule (300 mg total) by mouth 2 (two) times daily., Disp: 60 capsule, Rfl: 3 .  sertraline (ZOLOFT) 25 MG tablet, 2 tabs daily for a week, 1 tab daily for a week, then stop, Disp: 21 tablet, Rfl: 0 .  SYRINGE-NEEDLE, DISP, 3 ML (B-D 3CC LUER-LOK SYR 22GX1-1/2) 22G X 1-1/2" 3 ML MISC, USE DAILY AS DIRECTED, Disp: 10 each, Rfl: prn .  tadalafil (CIALIS) 5 MG tablet, Take 1.5 tablets (7.5 mg total) by mouth daily., Disp: 45 tablet, Rfl: 11 .  testosterone  cypionate (DEPOTESTOSTERONE CYPIONATE) 200 MG/ML injection, Inject 1 mL (200 mg total) into the muscle every 14 (fourteen) days., Disp: 10 mL, Rfl: 3 .  tiZANidine (ZANAFLEX) 2 MG tablet, TAKE 1 TABLET BY MOUTH 1 TIME EVERY 12 HOURS AS NEEDED, Disp: , Rfl:   Allergies  Allergen Reactions  . Tramadol Palpitations  . Meloxicam Other (See Comments)    Somnolence  . Sulfasalazine Nausea Only and Rash         Objective:  Physical Exam  General: AAO x3, NAD  Dermatological: Skin is warm, dry and supple bilateral.There are no open sores, no preulcerative lesions, no rash or signs of infection present.  Vascular: Dorsalis Pedis artery and Posterior Tibial artery pedal pulses are 2/4 bilateral with immedate capillary fill time.There is no pain with calf compression, swelling, warmth, erythema.   Neruologic: Grossly intact via light touch bilateral.   Musculoskeletal: Moderate bunion is present with tenderness on the medial first metatarsal head on the bunion also mild discomfort on the sesamoids.  There is prominence of metatarsal head plantarly with atrophy of the fat pad to this area.  No crepitation with MPJ range of motion today but subjectively he does describe this sensation.  Muscular strength 5/5 in all groups tested bilateral.  Gait: Unassisted, Nonantalgic.       Assessment:   Right foot bunion    Plan:  -Treatment options discussed including all alternatives, risks, and complications -Etiology of symptoms were discussed -X-rays were obtained and reviewed with the patient.  Moderate bunion is present.  No evidence of acute fracture. -We discussed with conservative as well as surgical treatment options.  He is attempted conservative treatment including offloading and shoe modification without any improvement he elects proceed with surgical intervention.  I discussed with him first metatarsal osteotomy and screw fixation versus MPJ fusion.  This would determine intraoperatively  if there is any arthritis in the joint.  Hopefully realignment of the sesamoids will be beneficial to help with the sesamoid pain.  Long-term will need an orthotic as well, which is already tried but not been helpful but will still need this postop. -The incision placement as well as the postoperative course was discussed with the patient. I discussed risks of the surgery which include, but not limited to, infection, bleeding, pain, swelling, need for further surgery, delayed or nonhealing, painful or ugly scar, numbness or sensation changes, over/under correction, recurrence, transfer lesions, further deformity, hardware failure, DVT/PE, loss of toe/foot. Patient understands these risks and wishes to proceed with surgery. The surgical consent was reviewed with the patient all 3 pages were signed. No promises or guarantees were given to the outcome of the procedure. All questions were answered to the best of my ability. Before the  surgery the patient was encouraged to call the office if there is any further questions. The surgery will be performed at the [facility] on an outpatient basis. -CAM boot dispensed for post-op use.    Vivi Barrack DPM

## 2020-11-22 ENCOUNTER — Telehealth: Payer: Self-pay | Admitting: Urology

## 2020-11-22 NOTE — Telephone Encounter (Signed)
DOS  - 12/13/20  [place] BUNIONECTOMY RIGHT --- 63016 VS [place] MPJ FUSION RIGHT --- [id number]   [employer] EFFECTIVE DATE - 01/06/20  PLAN DEDUCTIBLE - $1,500.00 OUT OF POCKET - $2,500.00 COINSURANCE - 20% COPAY - $0.00   PER [employer]'S AUTOMATIVE SYSTEM FOR CPT CODES 23557 AND 28750 NO PRIOR AUTH IS REQUIRED. REF # [id number]  REF # [id number]

## 2020-12-15 DIAGNOSIS — R519 Headache, unspecified: Secondary | ICD-10-CM

## 2020-12-18 ENCOUNTER — Encounter: Payer: Managed Care, Other (non HMO) | Admitting: Podiatry

## 2020-12-18 DIAGNOSIS — R519 Headache, unspecified: Secondary | ICD-10-CM | POA: Insufficient documentation

## 2020-12-18 NOTE — Assessment & Plan Note (Signed)
Frequent occipital headaches, initially suspected to be occipital neuralgia but did not respond extremely well to greater occipital blocks, I would like a second opinion from neurology.

## 2020-12-28 ENCOUNTER — Encounter: Payer: Managed Care, Other (non HMO) | Admitting: Podiatry

## 2021-01-05 ENCOUNTER — Ambulatory Visit (INDEPENDENT_AMBULATORY_CARE_PROVIDER_SITE_OTHER): Payer: Medicare Other | Admitting: Sports Medicine

## 2021-01-05 ENCOUNTER — Other Ambulatory Visit: Payer: Self-pay

## 2021-01-05 DIAGNOSIS — M5135 Other intervertebral disc degeneration, thoracolumbar region: Secondary | ICD-10-CM

## 2021-01-05 MED ORDER — PREDNISONE 50 MG PO TABS: 50.0000 mg | ORAL_TABLET | Freq: Every day | ORAL | 0 refills | Status: DC

## 2021-01-05 NOTE — Progress Notes (Signed)
    Procedures performed today:    None.  Independent interpretation of notes and tests performed by another provider:   [facility] - MRI Spine Lumbar WO Contrast - 06/15/2020 T12-L1:  No substantial canal or foraminal stenosis.  L1-L2:  No substantial canal or foraminal stenosis.  L2-L3:  No substantial canal or foraminal stenosis.  L3-L4:  No substantial canal or foraminal stenosis.  L4-L5:  There is a small diffuse disc bulge with minimal disc height loss and desiccation. There is no substantial spinal canal or neural foraminal stenosis.  L5-S1:  No substantial canal or foraminal stenosis.  EMG 01/10/2020 This is an essentially normal study. There is no definite electrophysiologic evidence of a large fiber peripheral polyneuropathy. In addition, there is no electrophysiologic evidence of a lumbosacral radiculopathy in the right lower extremity. Low amplitudes of peroneal motor studies recording the EDB in isolation are of uncertain clinical significance. Clinical correlation is required. Left really only significantlylow in the EDB.   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 He did have a lumbar spine MRI in December 2021 with [facility] that did not show any areas of neural compression, he did have L4-L5 degenerative disc disease. He does have a pain clinic, and is on hydrocodone. Because his pain is somewhat acute we will do a 5-day burst of prednisone, if insufficient improvement we will consider an epidural rather than a facet injection. If he does not get relief from the epidural we will likely  consider nerve conduction and EMG considering his complaints of radicular pain without any signs of foraminal stenosis on MRI. Ultimately I think he is going to get the majority of his relief working with his pain management provider.    ___________________________________________ Shaun Camacho. Shaun Camacho, M.D., ABFM., CAQSM. Primary Care and Sports Medicine [facility]  Adjunct Instructor of Family Medicine  [employer]

## 2021-01-05 NOTE — Assessment & Plan Note (Signed)
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 He did have a lumbar spine MRI in December 2021 with [facility] that did not show any areas of neural compression, he did have L4-L5 degenerative disc disease. He does have a pain clinic, and is on hydrocodone. Because his pain is somewhat acute we will do a 5-day burst of prednisone, if insufficient improvement we will consider an epidural rather than a facet injection. If he does not get relief from the epidural we will likely consider nerve conduction and EMG considering his complaints of radicular pain without any signs of foraminal stenosis on MRI. Ultimately I think he is going to get the majority of his relief working with his pain management provider.

## 2021-01-11 ENCOUNTER — Encounter: Payer: Managed Care, Other (non HMO) | Admitting: Podiatry

## 2021-01-15 ENCOUNTER — Other Ambulatory Visit: Payer: Self-pay | Admitting: Sports Medicine

## 2021-01-15 DIAGNOSIS — R200 Anesthesia of skin: Secondary | ICD-10-CM

## 2021-01-24 ENCOUNTER — Other Ambulatory Visit: Payer: Self-pay | Admitting: Sports Medicine

## 2021-01-24 DIAGNOSIS — F32A Depression, unspecified: Secondary | ICD-10-CM

## 2021-01-24 DIAGNOSIS — F419 Anxiety disorder, unspecified: Secondary | ICD-10-CM

## 2021-01-25 ENCOUNTER — Ambulatory Visit: Payer: Medicare Other | Admitting: Physician Assistant

## 2021-02-01 ENCOUNTER — Ambulatory Visit: Payer: Medicare Other | Admitting: Sports Medicine

## 2021-02-02 ENCOUNTER — Other Ambulatory Visit: Payer: Self-pay

## 2021-02-02 ENCOUNTER — Encounter: Payer: Self-pay | Admitting: Sports Medicine

## 2021-02-02 ENCOUNTER — Ambulatory Visit (INDEPENDENT_AMBULATORY_CARE_PROVIDER_SITE_OTHER): Payer: Medicare Other | Admitting: Sports Medicine

## 2021-02-02 DIAGNOSIS — M6530 Trigger finger, unspecified finger: Secondary | ICD-10-CM | POA: Diagnosis not present

## 2021-02-02 DIAGNOSIS — M5135 Other intervertebral disc degeneration, thoracolumbar region: Secondary | ICD-10-CM | POA: Diagnosis not present

## 2021-02-02 DIAGNOSIS — R251 Tremor, unspecified: Secondary | ICD-10-CM | POA: Diagnosis not present

## 2021-02-02 NOTE — Assessment & Plan Note (Signed)
We have is also noted an episode where his right index finger was stuck in flexion while clipping his son's nails. He has also noted similar symptoms in the middle finger. On exam he has a very small flexor tendon nodule, there is no visible triggering on exam, he has good motion, good strength. I advised him that trigger finger was common, I have 1 myself, and we will do trigger finger exercises followed by ultrasound-guided flexor tendon sheath injection if no better.

## 2021-02-02 NOTE — Progress Notes (Signed)
    Procedures performed today:    None.  Independent interpretation of notes and tests performed by another provider:   MRI from [facility] reviewed, no areas of right-sided L4 or L5 neuroforaminal stenosis, I did review records from his other MRIs and nerve conduction, listed below.  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 He looked up his symptoms on Web MD and is concerned that he has Parkinson's disease. There is no resting Shaun, no pill-rolling Shaun, he has a normal gait, no bradykinesia, no masklike facies. He is also a bit young for Parkinson's disease. His movements are for the most part normal and I think his hand Shaun is likely either due to his alprazolam or benign essential Shaun. No further investigation needed.  Trigger finger, right We have is also noted an episode where his right index finger was stuck in flexion while clipping his son's nails. He has also noted similar symptoms in the middle finger. On exam he has a very small flexor tendon nodule, there is no visible triggering on exam, he has good motion, good strength. I advised him that trigger finger was common, I have 1 myself, and we will do trigger finger exercises followed by ultrasound-guided flexor tendon sheath injection if no better.  DDD (degenerative disc disease), thoracolumbar Shaun Camacho has also noted increasing weakness to dorsiflexion in his right foot. He is able to dorsiflex it in the office, he notes he sometimes drags it. Tells me this is happened for the last 6 months, he did have an EMG/nerve conduction study in July 2021 that was a normal study. There is no evidence of right L4 or L5 impingement on an MRI done at [facility] in December 2021. Considering new symptoms  for 6 months it is reasonable to get a right lower extremity nerve conduction and EMG with Dr. Estella Husk but I think this is going to be more a matter of physical training than an underlying pathologic process.    ___________________________________________ Shaun Camacho. Shaun Camacho, M.D., ABFM., CAQSM. Primary Care and Sports Medicine [facility]  Adjunct Instructor of Family Medicine  [employer]

## 2021-02-02 NOTE — Assessment & Plan Note (Signed)
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 He looked up his symptoms on Web MD and is concerned that he has Parkinson's disease. There is no resting tremor, no pill-rolling tremor, he has a normal gait, no bradykinesia, no masklike facies. He is also a bit young for Parkinson's disease. His movements are for the most part normal and I think his hand tremor is likely either due to his alprazolam or benign essential tremor. No further investigation needed.

## 2021-02-02 NOTE — Assessment & Plan Note (Signed)
Shaun Camacho has also noted increasing weakness to dorsiflexion in his right foot. He is able to dorsiflex it in the office, he notes he sometimes drags it. Tells me this is happened for the last 6 months, he did have an EMG/nerve conduction study in July 2021 that was a normal study. There is no evidence of right L4 or L5 impingement on an MRI done at [facility] in December 2021. Considering new symptoms for 6 months it is reasonable to get a right lower extremity nerve conduction and EMG with Dr. Estella Husk but I think this is going to be more a matter of physical training than an underlying pathologic process.

## 2021-02-06 ENCOUNTER — Ambulatory Visit: Payer: Medicare Other | Admitting: Sports Medicine

## 2021-02-08 DIAGNOSIS — H5371 Glare sensitivity: Secondary | ICD-10-CM | POA: Diagnosis not present

## 2021-02-08 DIAGNOSIS — R519 Headache, unspecified: Secondary | ICD-10-CM | POA: Diagnosis not present

## 2021-02-13 DIAGNOSIS — M542 Cervicalgia: Secondary | ICD-10-CM | POA: Diagnosis not present

## 2021-02-13 DIAGNOSIS — G43909 Migraine, unspecified, not intractable, without status migrainosus: Secondary | ICD-10-CM | POA: Diagnosis not present

## 2021-02-13 DIAGNOSIS — Z79899 Other long term (current) drug therapy: Secondary | ICD-10-CM | POA: Diagnosis not present

## 2021-02-18 NOTE — Progress Notes (Signed)
  HPI with pertinent ROS:   CC: right hand pain  HPI: Pleasant 35 year old male presenting today with reports of 1 week of right hand pain, specifically localized in the palmar aspect of the proximal fourth finger.  He does not remember any specific injury but notes he has been moving out of his home which involved quite a bit of lifting and physical activity.  He does have movement in the hand and fingers but it is painful, especially with closing his hand into a full fist.  He has not been using any specific medications or home remedies to help with his discomfort.  I reviewed the past medical history, family history, social history, surgical history, and allergies today and no changes were needed.  Please see the problem list section below in epic for further details.   Physical exam:   General: Well Developed, well nourished, and in no acute distress.  Neuro: Alert and oriented x3.  HEENT: Normocephalic, atraumatic.  Skin: Warm and dry. Cardiac: Regular rate and rhythm, no murmurs rubs or gallops, no lower extremity edema.  Respiratory: Clear to auscultation bilaterally. Not using accessory muscles, speaking in full sentences. Right hand: Full range of motion to all fingers of the right hand, flexion of the fourth digit mildly limited by pain.  No significant weakness.  Neurovascularly intact.  Impression and Recommendations:    1. Right hand pain Getting x-rays today to evaluate for potential causes.  Slight swelling at the site of discomfort suspicious for soft tissue injury.  He is already on several different medications to control pain so not adding anything today.  Recommend ice and/or heat to the area depending on tolerance and preference.  Avoid heavy lifting using the right hand. - DG Hand Complete Right; Future  No follow-ups on file. ___________________________________________ Thayer Ohm, DNP, APRN, FNP-BC Primary Care and Sports Medicine [facility]  [place]

## 2021-02-19 ENCOUNTER — Other Ambulatory Visit: Payer: Self-pay

## 2021-02-19 ENCOUNTER — Encounter: Payer: Self-pay | Admitting: Medical-Surgical

## 2021-02-19 ENCOUNTER — Ambulatory Visit (INDEPENDENT_AMBULATORY_CARE_PROVIDER_SITE_OTHER): Payer: Medicare Other

## 2021-02-19 ENCOUNTER — Ambulatory Visit (INDEPENDENT_AMBULATORY_CARE_PROVIDER_SITE_OTHER): Payer: Medicare Other | Admitting: Medical-Surgical

## 2021-02-19 VITALS — BP 131/73 | HR 82 | Resp 20 | Wt 187.0 lb

## 2021-02-19 DIAGNOSIS — M79644 Pain in right finger(s): Secondary | ICD-10-CM | POA: Diagnosis not present

## 2021-02-19 DIAGNOSIS — M79641 Pain in right hand: Secondary | ICD-10-CM | POA: Diagnosis not present

## 2021-02-19 DIAGNOSIS — S6991XA Unspecified injury of right wrist, hand and finger(s), initial encounter: Secondary | ICD-10-CM | POA: Diagnosis not present

## 2021-02-20 ENCOUNTER — Emergency Department
Admission: EM | Admit: 2021-02-20 | Discharge: 2021-02-20 | Disposition: A | Discharge status: Home / Self Care | Payer: Medicare Other | Source: Home / Self Care | Attending: Family Medicine | Admitting: Family Medicine

## 2021-02-20 ENCOUNTER — Encounter: Payer: Self-pay | Admitting: Family Medicine

## 2021-02-20 ENCOUNTER — Ambulatory Visit: Payer: Self-pay

## 2021-02-20 ENCOUNTER — Other Ambulatory Visit: Payer: Self-pay

## 2021-02-20 ENCOUNTER — Ambulatory Visit (INDEPENDENT_AMBULATORY_CARE_PROVIDER_SITE_OTHER): Payer: Medicare Other | Admitting: Family Medicine

## 2021-02-20 VITALS — BP 130/90 | Ht 70.0 in | Wt 187.0 lb

## 2021-02-20 DIAGNOSIS — S66999A Other injury of unspecified muscle, fascia and tendon at wrist and hand level, unspecified hand, initial encounter: Secondary | ICD-10-CM | POA: Diagnosis not present

## 2021-02-20 DIAGNOSIS — M79642 Pain in left hand: Secondary | ICD-10-CM | POA: Diagnosis not present

## 2021-02-20 DIAGNOSIS — M79641 Pain in right hand: Secondary | ICD-10-CM

## 2021-02-20 NOTE — ED Triage Notes (Signed)
Pt c/o LT hand pain, particularly at the base of the middle finger since yesterday. Says he flicked a big of his car and hurt his finger. Pain 4/10

## 2021-02-20 NOTE — ED Provider Notes (Signed)
Shaun Camacho [facility]    CSN: [id number] Arrival date & time: 02/20/21  0803      History   Chief Complaint Chief Complaint  Patient presents with   Hand Pain    Lt    HPI Shaun Camacho is a 35 y.o. male.   HPI  Pleasant gentleman usually under the care of Dr. Benjamin Camacho.  Has multiple orthopedic problems.  Has had a couple of visits specifically for right hand problems and diagnoses within the last month.  He had a trigger finger of his right index.  He had pain in his fourth finger at the MCP, palmar crease.  Today is here for different injury.  He "flicked a bug" off of his vehicle using his right long finger and immediately felt pain on the top of his hand.  There is swelling.  Limited movement.  He is here for evaluation.  Past Medical History:  Diagnosis Date   Anxiety and depression 08/23/2016   08/23/2016 PHQ9 = 6, GAD7 = 7 09/12/2016 PHQ9 = 6, GAD7 = 3 10/11/2016 PHQ9 = 1, GAD7 = 1 12/27/2016 PHQ9 = 3, GAD7 = 4 01/24/2017 PHQ9 = 2, GAD7 = 5   Arthritis    Chronic headache    Complication of anesthesia    when wakes up gets agitated    GERD (gastroesophageal reflux disease) 12/29/2015   Male hypogonadism 01/12/2016   Radiculitis of right cervical region 03/22/2016   MRI from [facility] shows moderate to severe right C6-C7 neuroforaminal stenosis with mass-effect on the exiting right C7 nerve root.   Sleep apnea    uses cpap    Patient Active Problem List   Diagnosis Date Noted   Tremor 02/02/2021   Trigger finger, right 02/02/2021   Frequent headaches 12/18/2020   Urinary retention 11/18/2019   Chronic dermatitis of hands 11/10/2019   Obstructive sleep apnea 11/10/2019   Tinnitus 10/22/2019   Numbness and tingling of upper and lower extremities of both sides 10/20/2019   Right arm pain 10/13/2019   Cubital tunnel syndrome, left 10/13/2019   Subacromial bursitis of left shoulder joint 09/15/2019   Swelling of first metatarsophalangeal (MTP) joint of  right foot 01/18/2019   Medial epicondylitis, left 12/21/2018   Encounter for disability determination 10/08/2018   Polyarthralgia 08/13/2018   Carpal tunnel syndrome 08/13/2018   DDD (degenerative disc disease), thoracolumbar 05/25/2018   Pilonidal disease s/p excision 09/03/2018 07/15/2017   Anxiety and depression 08/23/2016   Lateral epicondylitis, right elbow 08/23/2016   History of fusion of cervical spine 03/22/2016   Male hypogonadism 01/12/2016   Annual physical exam 12/29/2015   GERD (gastroesophageal reflux disease) 12/29/2015    Past Surgical History:  Procedure Laterality Date   ANTERIOR CERVICAL DECOMP/DISCECTOMY FUSION N/A 07/15/2018   Procedure: ANTERIOR CERVICAL DECOMPRESSION FUSION, CERVICAL FIVE-SIX, CERVICAL SIX-SEVEN WITH INSTRUMENTATION AND ALLOGRAFT;  Surgeon: Estill Bamberg, MD;  Location: [facility] OR;  Service: Orthopedics;  Laterality: N/A;   CARPAL TUNNEL RELEASE Bilateral    07/2019, 11/2019   LATERAL EPICONDYLE RELEASE Right    PILONIDAL CYST EXCISION N/A 09/03/2018   Procedure: EXCISION OF PILONIDAL DISEASE;  Surgeon: Karie Soda, MD;  Location: [facility] ORS;  Service: General;  Laterality: N/A;   SURGERY SCROTAL / TESTICULAR     ULNAR NERVE TRANSPOSITION Left 02/28/2020   Procedure: LEFT ULNAR NEUROPLASTY AT HE ELBOW;  Surgeon: Mack Hook, MD;  Location: [facility];  Service: Orthopedics;  Laterality: Left;       Home Medications  Prior to Admission medications   Medication Sig Start Date End Date Taking? Authorizing Provider  acetaminophen (TYLENOL) 325 MG tablet Take 2 tablets (650 mg total) by mouth every 6 (six) hours. 02/28/20   Mack Hook, MD  ALPRAZolam Prudy Feeler) 0.5 MG tablet Take 1 tablet (0.5 mg total) by mouth 3 (three) times daily as needed for anxiety. 01/24/21   Monica Becton, MD  amitriptyline (ELAVIL) 50 MG tablet Take 1 tablet (50 mg total) by mouth at bedtime. 09/14/20   Monica Becton, MD  clobetasol cream  (TEMOVATE) 0.05 % Apply 1 application topically 2 (two) times daily. 09/19/20   Monica Becton, MD  DUEXIS 800-26.6 MG TABS TAKE 1 TABLET BY MOUTH 1 TIME EVERY 12 HOURS AS NEEDED 08/28/20   [provider]  ENBREL SURECLICK 50 MG/ML injection Inject into the skin once a week. 01/14/20   [provider]  escitalopram (LEXAPRO) 10 MG tablet One half tab daily for a week then 1 tab p.o. daily 10/26/20   Monica Becton, MD  GuanFACINE HCl 3 MG TB24 Take 1 tablet by mouth daily. 12/22/20   [provider]  HYDROcodone-acetaminophen (NORCO) 7.5-325 MG tablet SMARTSIG:1 Tablet(s) By Mouth Every 12 Hours PRN 05/08/20   [provider]  NEEDLE, DISP, 18 G (BD HYPODERMIC NEEDLE) 18G X 1" MISC USE DAILY AS DIRECTED 06/23/20   Monica Becton, MD  omeprazole (PRILOSEC) 40 MG capsule Take 1 capsule (40 mg total) by mouth 2 (two) times daily with a meal. 04/24/20   Monica Becton, MD  pregabalin (LYRICA) 300 MG capsule Take 1 capsule (300 mg total) by mouth 2 (two) times daily. 01/15/21   Monica Becton, MD  sertraline (ZOLOFT) 25 MG tablet 2 tabs daily for a week, 1 tab daily for a week, then stop 10/26/20   Monica Becton, MD  SYRINGE-NEEDLE, DISP, 3 ML (B-D 3CC LUER-LOK SYR 22GX1-1/2) 22G X 1-1/2" 3 ML MISC USE DAILY AS DIRECTED 06/23/20   Monica Becton, MD  tadalafil (CIALIS) 5 MG tablet Take 1.5 tablets (7.5 mg total) by mouth daily. 02/17/20   Monica Becton, MD  testosterone cypionate (DEPOTESTOSTERONE CYPIONATE) 200 MG/ML injection Inject 1 mL (200 mg total) into the muscle every 14 (fourteen) days. 10/17/20   Monica Becton, MD  tiZANidine (ZANAFLEX) 2 MG tablet TAKE 1 TABLET BY MOUTH 1 TIME EVERY 12 HOURS AS NEEDED 08/21/20   [provider]    Family History Family History  Problem Relation Age of Onset   Thyroid disease Father    Thyroid disease Sister    Diabetes Maternal Grandmother    Colon  cancer Maternal Grandfather    Prostate cancer Maternal Grandfather    Colon cancer Other        Maternal uncle son    Heart disease Maternal Great-grandmother    Prostate cancer Maternal Uncle    Esophageal cancer Neg Hx    Rectal cancer Neg Hx    Stomach cancer Neg Hx     Social History Social History   Tobacco Use   Smoking status: Every Day    Packs/day: 0.50    Years: 18.00    Pack years: 9.00    Types: Cigarettes   Smokeless tobacco: Current    Types: Chew  Vaping Use   Vaping Use: Never used  Substance Use Topics   Alcohol use: Yes    Comment: rare   Drug use: No     Allergies  Tramadol, Meloxicam, and Sulfasalazine   Review of Systems Review of Systems See HPI  Physical Exam Triage Vital Signs ED Triage Vitals  Enc Vitals Group     BP 02/20/21 0814 (!) 135/93     Pulse Rate 02/20/21 0814 79     Resp 02/20/21 0814 18     Temp 02/20/21 0814 98.3 F (36.8 C)     Temp Source 02/20/21 0814 Oral     SpO2 02/20/21 0814 98 %     Weight --      Height --      Head Circumference --      Peak Flow --      Pain Score 02/20/21 0816 4     Pain Loc --      Pain Edu? --      Excl. in GC? --    No data found.  Updated Vital Signs BP (!) 135/93 (BP Location: Right Arm)   Pulse 79   Temp 98.3 F (36.8 C) (Oral)   Resp 18   SpO2 98%      Physical Exam Constitutional:      General: He is not in acute distress.    Appearance: He is well-developed.  HENT:     Head: Normocephalic and atraumatic.     Nose:     Comments: Mask is in place Eyes:     Conjunctiva/sclera: Conjunctivae normal.     Pupils: Pupils are equal, round, and reactive to light.  Cardiovascular:     Rate and Rhythm: Normal rate.  Pulmonary:     Effort: Pulmonary effort is normal. No respiratory distress.  Abdominal:     General: There is no distension.     Palpations: Abdomen is soft.  Musculoskeletal:        General: Swelling and signs of injury present. Normal range of  motion.       Hands:     Cervical back: Normal range of motion.     Comments: Right hand  Skin:    General: Skin is warm and dry.  Neurological:     Mental Status: He is alert.     UC Treatments / Results  Labs (all labs ordered are listed, but only abnormal results are displayed) Labs Reviewed - No data to display  EKG   Radiology DG Hand Complete Right  Result Date: 02/19/2021 CLINICAL DATA:  History of injury, pain at base of ring finger EXAM: RIGHT HAND - COMPLETE 3+ VIEW COMPARISON:  None. FINDINGS: There is no evidence of fracture or dislocation. There is no evidence of arthropathy or other focal bone abnormality. Soft tissues are unremarkable. IMPRESSION: Negative. Electronically Signed   By: Jasmine Pang M.D.   On: 02/19/2021 23:48    Procedures Procedures (including critical care time)  Medications Ordered in UC Medications - No data to display  Initial Impression / Assessment and Plan / UC Course  I have reviewed the triage vital signs and the nursing notes.  Pertinent labs & imaging results that were available during my care of the patient were reviewed by me and considered in my medical decision making (see chart for details).     I attempted to speak with Dr. Benjamin Camacho regarding this patient's injuries.  He unfortunately is out of the office.  I therefore called his colleague Ralene Bathe.  Riki Rusk recommends that this area be ultrasounded and possibly x-ray to look for fracture and to the current course of the tendon.  He is able to do  this in the office.  I advised patient that he needed to follow-up this week, ASAP, with one of the sports medicine providers.  Please wear splint.  He is to use ice.  Needs to continue with anti-inflammatories. Patient refused splinting in the office Final Clinical Impressions(s) / UC Diagnoses   Final diagnoses:  Avulsion of tendon of finger  Hand pain, right     Discharge Instructions      Leave splint in  place Avoid heavy use of hand Put ice on area for 20 minutes every couple of hours Continue current medications You need to see sports medicine later this week.  Call Dr. Lucienne Minkshekkekandam's office.  If he cannot be worked in there, call Dr. Clare GandyJeremy Schmitz office and he is happy to see you.   ED Prescriptions   None    PDMP not reviewed this encounter.   Eustace MooreNelson, Ylonda Storr Sue, MD 02/20/21 1034

## 2021-02-20 NOTE — ED Notes (Signed)
Finger splint refused by patient - wants to buy his own - verbalized an understanding of the need to immobilize  Right middle finger joint so it does not bend. Dr Delton See updated

## 2021-02-20 NOTE — Progress Notes (Signed)
Shaun Camacho - 35 y.o. male MRN [id number]  Date of birth: 08/11/1985  SUBJECTIVE:  Including CC & ROS.  No chief complaint on file.   Shaun Camacho is a 35 y.o. male that is presenting with left middle finger pain.  He was flicking a bug out of his car and felt significant pain.  Having swelling over the third MCP joint.  Having limited range of motion..  Independent review of the right hand x-ray from 8/16 shows no acute changes.   Review of Systems See HPI   HISTORY: Past Medical, Surgical, Social, and Family History Reviewed & Updated per EMR.   Pertinent Historical Findings include:  Past Medical History:  Diagnosis Date   Anxiety and depression 08/23/2016   08/23/2016 PHQ9 = 6, GAD7 = 7 09/12/2016 PHQ9 = 6, GAD7 = 3 10/11/2016 PHQ9 = 1, GAD7 = 1 12/27/2016 PHQ9 = 3, GAD7 = 4 01/24/2017 PHQ9 = 2, GAD7 = 5   Arthritis    Chronic headache    Complication of anesthesia    when wakes up gets agitated    GERD (gastroesophageal reflux disease) 12/29/2015   Male hypogonadism 01/12/2016   Radiculitis of right cervical region 03/22/2016   MRI from [facility] shows moderate to severe right C6-C7 neuroforaminal stenosis with mass-effect on the exiting right C7 nerve root.   Sleep apnea    uses cpap    Past Surgical History:  Procedure Laterality Date   ANTERIOR CERVICAL DECOMP/DISCECTOMY FUSION N/A 07/15/2018   Procedure: ANTERIOR CERVICAL DECOMPRESSION FUSION, CERVICAL FIVE-SIX, CERVICAL SIX-SEVEN WITH INSTRUMENTATION AND ALLOGRAFT;  Surgeon: Estill Bamberg, MD;  Location: [facility] OR;  Service: Orthopedics;  Laterality: N/A;   CARPAL TUNNEL RELEASE Bilateral    07/2019, 11/2019   LATERAL EPICONDYLE RELEASE Right    PILONIDAL CYST EXCISION N/A 09/03/2018   Procedure: EXCISION OF PILONIDAL DISEASE;  Surgeon: Karie Soda, MD;  Location: [facility];  Service: General;  Laterality: N/A;   SURGERY SCROTAL / TESTICULAR     ULNAR NERVE TRANSPOSITION Left 02/28/2020   Procedure: LEFT ULNAR  NEUROPLASTY AT HE ELBOW;  Surgeon: Mack Hook, MD;  Location: [facility];  Service: Orthopedics;  Laterality: Left;    Family History  Problem Relation Age of Onset   Thyroid disease Father    Thyroid disease Sister    Diabetes Maternal Grandmother    Colon cancer Maternal Grandfather    Prostate cancer Maternal Grandfather    Colon cancer Other        Maternal uncle son    Heart disease Maternal Great-grandmother    Prostate cancer Maternal Uncle    Esophageal cancer Neg Hx    Rectal cancer Neg Hx    Stomach cancer Neg Hx     Social History   Socioeconomic History   Marital status: Single    Spouse name: Not on file   Number of children: Not on file   Years of education: Not on file   Highest education level: Not on file  Occupational History   Not on file  Tobacco Use   Smoking status: Every Day    Packs/day: 0.50    Years: 18.00    Pack years: 9.00    Types: Cigarettes   Smokeless tobacco: Current    Types: Chew  Vaping Use   Vaping Use: Never used  Substance and Sexual Activity   Alcohol use: Yes    Comment: rare   Drug use: No   Sexual activity: Not on file  Other Topics Concern   Not on file  Social History Narrative   Not on file   Social Determinants of Health   Financial Resource Strain: Not on file  Food Insecurity: Not on file  Transportation Needs: Not on file  Physical Activity: Not on file  Stress: Not on file  Social Connections: Not on file  Intimate Partner Violence: Not on file     PHYSICAL EXAM:  VS: BP 130/90 (BP Location: Right Arm, Patient Position: Sitting, Cuff Size: Large)   Ht 5\' 10"  (1.778 m)   Wt 187 lb (84.8 kg)   BMI 26.83 kg/m  Physical Exam Gen: NAD, alert, cooperative with exam, well-appearing MSK:  Left hand: Normal passive extension of the third MCP joint. Limited flexion of the third MCP joint. Tenderness to palpation over the third MCP joint Neurovascularly intact  Limited ultrasound:  Left hand:  Normal-appearing third MCP joint. Hypoechoic change within the extensor sheath of the third tendon overlying the third MCP joint.  Subluxation is occurring ulnarly on dynamic testing.  Summary: Subluxation of the extensor tendon at the third MCP joint.  Ultrasound and interpretation by , MD  1. Wrist/hand 2. left 3. Radial gutter splint 4. Ortho-glass 5. Applied by Dr. Clare Gandy    ASSESSMENT & PLAN:   Left hand pain Traumatic initiation of the subluxation of the extensor tendon overlying the third MCP joint.  Unable to reduce with traction today. -Counseled on home exercise therapy and supportive care  -Placed in splint. -Referral to hand surgery.

## 2021-02-20 NOTE — Discharge Instructions (Addendum)
Leave splint in place Avoid heavy use of hand Put ice on area for 20 minutes every couple of hours Continue current medications You need to see sports medicine later this week.  Call Dr. Lucienne Minks office.  If he cannot be worked in there, call Dr. Clare Gandy office and he is happy to see you.

## 2021-02-21 DIAGNOSIS — M79642 Pain in left hand: Secondary | ICD-10-CM | POA: Insufficient documentation

## 2021-02-21 NOTE — Assessment & Plan Note (Signed)
Traumatic initiation of the subluxation of the extensor tendon overlying the third MCP joint.  Unable to reduce with traction today. -Counseled on home exercise therapy and supportive care  -Placed in splint. -Referral to hand surgery.

## 2021-02-23 DIAGNOSIS — Z79899 Other long term (current) drug therapy: Secondary | ICD-10-CM | POA: Diagnosis not present

## 2021-02-23 DIAGNOSIS — M545 Low back pain, unspecified: Secondary | ICD-10-CM | POA: Diagnosis not present

## 2021-02-23 DIAGNOSIS — G8929 Other chronic pain: Secondary | ICD-10-CM | POA: Diagnosis not present

## 2021-02-23 DIAGNOSIS — M62838 Other muscle spasm: Secondary | ICD-10-CM | POA: Diagnosis not present

## 2021-02-23 DIAGNOSIS — M542 Cervicalgia: Secondary | ICD-10-CM | POA: Diagnosis not present

## 2021-02-27 ENCOUNTER — Other Ambulatory Visit: Payer: Self-pay | Admitting: Sports Medicine

## 2021-02-27 DIAGNOSIS — S63653A Sprain of metacarpophalangeal joint of left middle finger, initial encounter: Secondary | ICD-10-CM | POA: Diagnosis not present

## 2021-02-27 DIAGNOSIS — R339 Retention of urine, unspecified: Secondary | ICD-10-CM

## 2021-02-28 DIAGNOSIS — S63653A Sprain of metacarpophalangeal joint of left middle finger, initial encounter: Secondary | ICD-10-CM | POA: Diagnosis not present

## 2021-02-28 DIAGNOSIS — S66313A Strain of extensor muscle, fascia and tendon of left middle finger at wrist and hand level, initial encounter: Secondary | ICD-10-CM | POA: Diagnosis not present

## 2021-03-02 ENCOUNTER — Ambulatory Visit (INDEPENDENT_AMBULATORY_CARE_PROVIDER_SITE_OTHER): Payer: Medicare Other | Admitting: Sports Medicine

## 2021-03-02 ENCOUNTER — Other Ambulatory Visit: Payer: Self-pay

## 2021-03-02 ENCOUNTER — Ambulatory Visit (INDEPENDENT_AMBULATORY_CARE_PROVIDER_SITE_OTHER): Payer: Medicare Other

## 2021-03-02 DIAGNOSIS — R251 Tremor, unspecified: Secondary | ICD-10-CM

## 2021-03-02 DIAGNOSIS — Z0000 Encounter for general adult medical examination without abnormal findings: Secondary | ICD-10-CM | POA: Diagnosis not present

## 2021-03-02 DIAGNOSIS — M5135 Other intervertebral disc degeneration, thoracolumbar region: Secondary | ICD-10-CM

## 2021-03-02 DIAGNOSIS — M6530 Trigger finger, unspecified finger: Secondary | ICD-10-CM

## 2021-03-02 NOTE — Assessment & Plan Note (Signed)
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 He has no tremor at rest, he has been off of alprazolam for some time now, we will have his neurologist weigh in here. Ultimately I do not think any additional investigation will be needed for the tremor but I would like a second opinion from his neurologist.

## 2021-03-02 NOTE — Assessment & Plan Note (Signed)
Persistent triggering of right ring finger, small flexor tendon nodule, moderate pain, did not resolve with conservative treatment, we did a right fourth flexor tendon sheath injection with ultrasound guidance today, return to see me as needed.

## 2021-03-02 NOTE — Progress Notes (Signed)
    Procedures performed today:    Procedure: Real-time Ultrasound Guided injection of the right fourth flexor tendon sheath Device: Samsung HS60  Verbal informed consent obtained.  Time-out conducted.  Noted no overlying erythema, induration, or other signs of local infection.  Skin prepped in a sterile fashion.  Local anesthesia: Topical Ethyl chloride.  With sterile technique and under real time ultrasound guidance: Noted small nodule, minimal synovitis, 1/2 cc lidocaine, 1/2 cc kenalog 40 injected easily.  Completed without difficulty  Advised to call if fevers/chills, erythema, induration, drainage, or persistent bleeding.  Images permanently stored and available for review in PACS.  Impression: Technically successful ultrasound guided injection.  Independent interpretation of notes and tests performed by another provider:   None.  Brief History, Exam, Impression, and Recommendations:    Trigger finger, right Persistent triggering of right ring finger, small flexor tendon nodule, moderate pain, did not resolve with conservative treatment, we did a right fourth flexor tendon sheath injection with ultrasound guidance today, return to see me as needed.  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 He has no Shaun at rest, he has been off of alprazolam for some time now, we will have his neurologist weigh in here. Ultimately I do not think any additional investigation will be needed for the Shaun but I would like a second opinion from his neurologist.  DDD (degenerative disc disease), thoracolumbar As noted prior Shaun Camacho also has increasing weakness to dorsiflexion in the  right foot, he is able to dorsiflex it in the office but notes that sometimes he drags it. This is happened for the last 7 months, he had an EMG/nerve conduction July 2021 that was normal. No evidence of right L4 or L5 impingement on MRI done at [facility] in December 2021, we do need another conduction/EMG considering its been 7 months since his previous study. Right lower extremity. Ultimately again I do think it is going to require more physical training than identifying an underlying pathologic process.  Annual physical exam Having some weight loss, requesting thyroid function. We will get all of his routine labs as well.    ___________________________________________ [name]. Benjamin Stain, M.D., ABFM., CAQSM. Primary Care and Sports Medicine [facility]  Adjunct Instructor of Family Medicine  [employer]

## 2021-03-02 NOTE — Assessment & Plan Note (Signed)
As noted prior Shaun Camacho also has increasing weakness to dorsiflexion in the right foot, he is able to dorsiflex it in the office but notes that sometimes he drags it. This is happened for the last 7 months, he had an EMG/nerve conduction July 2021 that was normal. No evidence of right L4 or L5 impingement on MRI done at [facility] ([facility] in December 2021, we do need another conduction/EMG considering its been 7 months since his previous study. Right lower extremity. Ultimately again I do think it is going to require more physical training than identifying an underlying pathologic process.

## 2021-03-02 NOTE — Assessment & Plan Note (Signed)
Having some weight loss, requesting thyroid function. We will get all of his routine labs as well.

## 2021-03-03 LAB — COMPREHENSIVE METABOLIC PANEL
AG Ratio: 1.9 (calc) (ref 1.0–2.5)
ALT: 18 U/L (ref 9–46)
AST: 18 U/L (ref 10–40)
Albumin: 4.3 g/dL (ref 3.6–5.1)
Alkaline phosphatase (APISO): 67 U/L (ref 36–130)
BUN: 14 mg/dL (ref 7–25)
CO2: 30 mmol/L (ref 20–32)
Calcium: 9.1 mg/dL (ref 8.6–10.3)
Chloride: 104 mmol/L (ref 98–110)
Creat: 1.13 mg/dL (ref 0.60–1.26)
Globulin: 2.3 g/dL (calc) (ref 1.9–3.7)
Glucose, Bld: 78 mg/dL (ref 65–99)
Potassium: 4.4 mmol/L (ref 3.5–5.3)
Sodium: 140 mmol/L (ref 135–146)
Total Bilirubin: 0.4 mg/dL (ref 0.2–1.2)
Total Protein: 6.6 g/dL (ref 6.1–8.1)

## 2021-03-03 LAB — T4, FREE: Free T4: 0.8 ng/dL (ref 0.8–1.8)

## 2021-03-03 LAB — HEMOGLOBIN A1C
Hgb A1c MFr Bld: 5.1 % of total Hgb (ref <5.7)
Mean Plasma Glucose: 100 mg/dL
eAG (mmol/L): 5.5 mmol/L

## 2021-03-03 LAB — TSH: TSH: 1.37 mIU/L (ref 0.40–4.50)

## 2021-03-03 LAB — CBC
HCT: 46.7 % (ref 38.5–50.0)
Hemoglobin: 16.2 g/dL (ref 13.2–17.1)
MCH: 31.6 pg (ref 27.0–33.0)
MCHC: 34.7 g/dL (ref 32.0–36.0)
MCV: 91.2 fL (ref 80.0–100.0)
MPV: 9.9 fL (ref 7.5–12.5)
Platelets: 305 10*3/uL (ref 140–400)
RBC: 5.12 10*6/uL (ref 4.20–5.80)
RDW: 13.1 % (ref 11.0–15.0)
WBC: 14.5 10*3/uL — ABNORMAL HIGH (ref 3.8–10.8)

## 2021-03-03 LAB — T3, FREE: T3, Free: 3.2 pg/mL (ref 2.3–4.2)

## 2021-03-03 LAB — LIPID PANEL
Cholesterol: 197 mg/dL (ref <200)
HDL: 43 mg/dL (ref >=40)
LDL Cholesterol (Calc): 128 mg/dL (calc) — ABNORMAL HIGH
Non-HDL Cholesterol (Calc): 154 mg/dL (calc) — ABNORMAL HIGH (ref <130)
Total CHOL/HDL Ratio: 4.6 (calc) (ref <5.0)
Triglycerides: 147 mg/dL (ref <150)

## 2021-03-07 DIAGNOSIS — S63413D Traumatic rupture of collateral ligament of left middle finger at metacarpophalangeal and interphalangeal joint, subsequent encounter: Secondary | ICD-10-CM | POA: Diagnosis not present

## 2021-03-13 ENCOUNTER — Other Ambulatory Visit: Payer: Self-pay | Admitting: Sports Medicine

## 2021-03-13 DIAGNOSIS — F419 Anxiety disorder, unspecified: Secondary | ICD-10-CM

## 2021-03-13 DIAGNOSIS — F32A Depression, unspecified: Secondary | ICD-10-CM

## 2021-03-14 DIAGNOSIS — M5412 Radiculopathy, cervical region: Secondary | ICD-10-CM | POA: Diagnosis not present

## 2021-03-14 DIAGNOSIS — M5417 Radiculopathy, lumbosacral region: Secondary | ICD-10-CM | POA: Diagnosis not present

## 2021-03-14 DIAGNOSIS — G250 Essential tremor: Secondary | ICD-10-CM | POA: Diagnosis not present

## 2021-03-14 DIAGNOSIS — G43909 Migraine, unspecified, not intractable, without status migrainosus: Secondary | ICD-10-CM | POA: Diagnosis not present

## 2021-03-14 DIAGNOSIS — G5622 Lesion of ulnar nerve, left upper limb: Secondary | ICD-10-CM | POA: Diagnosis not present

## 2021-03-14 DIAGNOSIS — G5603 Carpal tunnel syndrome, bilateral upper limbs: Secondary | ICD-10-CM | POA: Diagnosis not present

## 2021-03-15 DIAGNOSIS — S63413D Traumatic rupture of collateral ligament of left middle finger at metacarpophalangeal and interphalangeal joint, subsequent encounter: Secondary | ICD-10-CM | POA: Diagnosis not present

## 2021-03-20 DIAGNOSIS — M545 Low back pain, unspecified: Secondary | ICD-10-CM | POA: Diagnosis not present

## 2021-03-20 DIAGNOSIS — M5412 Radiculopathy, cervical region: Secondary | ICD-10-CM | POA: Diagnosis not present

## 2021-03-20 DIAGNOSIS — G43909 Migraine, unspecified, not intractable, without status migrainosus: Secondary | ICD-10-CM | POA: Diagnosis not present

## 2021-03-20 DIAGNOSIS — G8929 Other chronic pain: Secondary | ICD-10-CM | POA: Diagnosis not present

## 2021-03-28 DIAGNOSIS — S63413D Traumatic rupture of collateral ligament of left middle finger at metacarpophalangeal and interphalangeal joint, subsequent encounter: Secondary | ICD-10-CM | POA: Diagnosis not present

## 2021-04-03 DIAGNOSIS — S63413D Traumatic rupture of collateral ligament of left middle finger at metacarpophalangeal and interphalangeal joint, subsequent encounter: Secondary | ICD-10-CM | POA: Diagnosis not present

## 2021-04-05 ENCOUNTER — Encounter (INDEPENDENT_AMBULATORY_CARE_PROVIDER_SITE_OTHER): Payer: Medicare Other

## 2021-04-05 DIAGNOSIS — F32A Depression, unspecified: Secondary | ICD-10-CM

## 2021-04-05 DIAGNOSIS — F419 Anxiety disorder, unspecified: Secondary | ICD-10-CM | POA: Diagnosis not present

## 2021-04-06 MED ORDER — ESCITALOPRAM OXALATE 20 MG PO TABS: ORAL_TABLET | ORAL | 3 refills | Status: DC

## 2021-04-06 NOTE — Telephone Encounter (Signed)
I spent 5 total minutes of online digital evaluation and management services. 

## 2021-04-10 NOTE — Telephone Encounter (Signed)
Anquan sent two messages, I cannot manage either of these over email.

## 2021-04-12 ENCOUNTER — Ambulatory Visit (INDEPENDENT_AMBULATORY_CARE_PROVIDER_SITE_OTHER): Payer: Medicare Other | Admitting: Family Medicine

## 2021-04-12 ENCOUNTER — Encounter: Payer: Self-pay | Admitting: Family Medicine

## 2021-04-12 ENCOUNTER — Other Ambulatory Visit: Payer: Self-pay

## 2021-04-12 VITALS — BP 138/95 | HR 76 | Temp 98.5°F | Wt 184.0 lb

## 2021-04-12 DIAGNOSIS — F419 Anxiety disorder, unspecified: Secondary | ICD-10-CM | POA: Diagnosis not present

## 2021-04-12 DIAGNOSIS — M5135 Other intervertebral disc degeneration, thoracolumbar region: Secondary | ICD-10-CM

## 2021-04-12 DIAGNOSIS — R030 Elevated blood-pressure reading, without diagnosis of hypertension: Secondary | ICD-10-CM

## 2021-04-12 DIAGNOSIS — F32A Depression, unspecified: Secondary | ICD-10-CM

## 2021-04-12 DIAGNOSIS — Z23 Encounter for immunization: Secondary | ICD-10-CM

## 2021-04-12 MED ORDER — VORTIOXETINE HBR 10 MG PO TABS: 10.0000 mg | ORAL_TABLET | Freq: Every day | ORAL | 3 refills | Status: DC

## 2021-04-12 NOTE — Progress Notes (Signed)
Acute Office Visit  Subjective:    Patient ID: Shaun Camacho, male    DOB: 1986-01-11, 35 y.o.   MRN: [id number]  Chief Complaint  Patient presents with   Anxiety    HPI Patient is in today for anxiety.  He has been taking Lexapro for several months for depression/anxiety, but he states that it was "messing with him" (making him feel bad, more anxious, having prolonged erections > 3 hours, etc.). States he has been on Zoloft in the past and didn't really like it either, but the Lexapro was causing enough problems that he started weaning himself off over the past week and is now completely off. He denies any other adverse side effects of the medication. He denies suicidal/homicidal ideation. Needs something to help his anxiety that won't have as significant sexual dysfunction side effects.    Back pain - Patient states he has been struggling with DDD and pinched nerve with some sciatica for awhile now. He recently got an injection from a neurologist which did nothing. States he goes to [facility] and has requested a higher dose of his Norco, but is waiting to hear back. He has tried oral prednisone in the past with no improvement. Currently on Lyrica. Reports one doctor discouraged him from back surgery since he is still so young, but he reports he is miserable now and willing to do anything for some relief. He would like Dr. Melvia Heaps input on this.     Flowsheet Row Office Visit from 04/12/2021 in [facility]  PHQ-9 Total Score 11      GAD 7 : Generalized Anxiety Score 04/12/2021 09/28/2020 08/31/2020 03/04/2017  Nervous, Anxious, on Edge 3 3 3  0  Control/stop worrying 1 2 3  0  Worry too much - different things 1 3 3  0  Trouble relaxing 2 3 3  0  Restless 2 3 3  0  Easily annoyed or irritable 3 3 3  0  Afraid - awful might happen 0 0 0 0  Total GAD 7 Score 12 17 18  0  Anxiety Difficulty Not difficult at all - Somewhat difficult -       Past Medical History:  Diagnosis Date   Anxiety and depression 08/23/2016   08/23/2016 PHQ9 = 6, GAD7 = 7 09/12/2016 PHQ9 = 6, GAD7 = 3 10/11/2016 PHQ9 = 1, GAD7 = 1 12/27/2016 PHQ9 = 3, GAD7 = 4 01/24/2017 PHQ9 = 2, GAD7 = 5   Arthritis    Chronic headache    Complication of anesthesia    when wakes up gets agitated    GERD (gastroesophageal reflux disease) 12/29/2015   Male hypogonadism 01/12/2016   Radiculitis of right cervical region 03/22/2016   MRI from [facility] shows moderate to severe right C6-C7 neuroforaminal stenosis with mass-effect on the exiting right C7 nerve root.   Sleep apnea    uses cpap    Past Surgical History:  Procedure Laterality Date   ANTERIOR CERVICAL DECOMP/DISCECTOMY FUSION N/A 07/15/2018   Procedure: ANTERIOR CERVICAL DECOMPRESSION FUSION, CERVICAL FIVE-SIX, CERVICAL SIX-SEVEN WITH INSTRUMENTATION AND ALLOGRAFT;  Surgeon: 12/29/2016, MD;  Location: [facility] OR;  Service: Orthopedics;  Laterality: N/A;   CARPAL TUNNEL RELEASE Bilateral    07/2019, 11/2019   LATERAL EPICONDYLE RELEASE Right    PILONIDAL CYST EXCISION N/A 09/03/2018   Procedure: EXCISION OF PILONIDAL DISEASE;  Surgeon: 03/24/2016, MD;  Location: [facility] ORS;  Service: General;  Laterality: N/A;   SURGERY SCROTAL / TESTICULAR  ULNAR NERVE TRANSPOSITION Left 02/28/2020   Procedure: LEFT ULNAR NEUROPLASTY AT HE ELBOW;  Surgeon: Mack Hook, MD;  Location: [facility];  Service: Orthopedics;  Laterality: Left;    Family History  Problem Relation Age of Onset   Thyroid disease Father    Thyroid disease Sister    Diabetes Maternal Grandmother    Colon cancer Maternal Grandfather    Prostate cancer Maternal Grandfather    Colon cancer Other        Maternal uncle son    Heart disease Maternal Great-grandmother    Prostate cancer Maternal Uncle    Esophageal cancer Neg Hx    Rectal cancer Neg Hx    Stomach cancer Neg Hx     Social History   Socioeconomic History    Marital status: Single    Spouse name: Not on file   Number of children: Not on file   Years of education: Not on file   Highest education level: Not on file  Occupational History   Not on file  Tobacco Use   Smoking status: Every Day    Packs/day: 0.50    Years: 18.00    Pack years: 9.00    Types: Cigarettes   Smokeless tobacco: Current    Types: Chew  Vaping Use   Vaping Use: Never used  Substance and Sexual Activity   Alcohol use: Yes    Comment: rare   Drug use: No   Sexual activity: Not on file  Other Topics Concern   Not on file  Social History Narrative   Not on file   Social Determinants of Health   Financial Resource Strain: Not on file  Food Insecurity: Not on file  Transportation Needs: Not on file  Physical Activity: Not on file  Stress: Not on file  Social Connections: Not on file  Intimate Partner Violence: Not on file    Outpatient Medications Prior to Visit  Medication Sig Dispense Refill   acetaminophen (TYLENOL) 325 MG tablet Take 2 tablets (650 mg total) by mouth every 6 (six) hours.     ALPRAZolam (XANAX) 0.5 MG tablet Take 1 tablet (0.5 mg total) by mouth 3 (three) times daily as needed for anxiety. 15 tablet 0   clobetasol cream (TEMOVATE) 0.05 % Apply 1 application topically 2 (two) times daily. 60 g 2   DUEXIS 800-26.6 MG TABS TAKE 1 TABLET BY MOUTH 1 TIME EVERY 12 HOURS AS NEEDED     ENBREL SURECLICK 50 MG/ML injection Inject into the skin once a week.     HYDROcodone-acetaminophen (NORCO) 7.5-325 MG tablet SMARTSIG:1 Tablet(s) By Mouth Every 12 Hours PRN     NEEDLE, DISP, 18 G (BD HYPODERMIC NEEDLE) 18G X 1" MISC USE DAILY AS DIRECTED 10 each prn   NURTEC 75 MG TBDP Take 1 tablet by mouth every other day.     omeprazole (PRILOSEC) 40 MG capsule Take 1 capsule (40 mg total) by mouth 2 (two) times daily with a meal. 180 capsule 3   pregabalin (LYRICA) 300 MG capsule Take 1 capsule (300 mg total) by mouth 2 (two) times daily. 60 capsule 3    SYRINGE-NEEDLE, DISP, 3 ML (B-D 3CC LUER-LOK SYR 22GX1-1/2) 22G X 1-1/2" 3 ML MISC USE DAILY AS DIRECTED 10 each prn   tadalafil (CIALIS) 5 MG tablet TAKE 1 AND 1/2 TABLET BY MOUTH DAILY 135 tablet 3   testosterone cypionate (DEPOTESTOSTERONE CYPIONATE) 200 MG/ML injection Inject 1 mL (200 mg total) into the muscle every  14 (fourteen) days. 10 mL 3   tiZANidine (ZANAFLEX) 2 MG tablet TAKE 1 TABLET BY MOUTH 1 TIME EVERY 12 HOURS AS NEEDED     amitriptyline (ELAVIL) 50 MG tablet Take 1 tablet (50 mg total) by mouth at bedtime. (Patient not taking: Reported on 04/12/2021) 90 tablet 3   GuanFACINE HCl 3 MG TB24 Take 1 tablet by mouth daily. (Patient not taking: Reported on 04/12/2021)     escitalopram (LEXAPRO) 20 MG tablet Take one tablet by mouth daily. (Patient not taking: Reported on 04/12/2021) 30 tablet 3   No facility-administered medications prior to visit.    Allergies  Allergen Reactions   Tramadol Palpitations   Meloxicam Other (See Comments)    Somnolence   Sulfasalazine Nausea Only and Rash    Review of Systems All review of systems negative except what is listed in the HPI     Objective:    Physical Exam Vitals reviewed.  Constitutional:      Appearance: Normal appearance. He is normal weight.  HENT:     Head: Normocephalic and atraumatic.  Cardiovascular:     Rate and Rhythm: Normal rate and regular rhythm.     Pulses: Normal pulses.     Heart sounds: Normal heart sounds.  Pulmonary:     Effort: Pulmonary effort is normal.     Breath sounds: Normal breath sounds.  Musculoskeletal:     Cervical back: Neck supple.  Skin:    General: Skin is warm and dry.  Neurological:     Mental Status: He is alert and oriented to person, place, and time.  Psychiatric:        Mood and Affect: Mood normal.        Behavior: Behavior normal.        Thought Content: Thought content normal.        Judgment: Judgment normal.    BP (!) 138/95 (BP Location: Right Arm, Patient  Position: Sitting, Cuff Size: Large)   Pulse 76   Temp 98.5 F (36.9 C) (Oral)   Wt 184 lb 0.6 oz (83.5 kg)   BMI 26.41 kg/m  Wt Readings from Last 3 Encounters:  04/12/21 184 lb 0.6 oz (83.5 kg)  02/20/21 187 lb (84.8 kg)  02/19/21 187 lb (84.8 kg)    Health Maintenance Due  Topic Date Due   HIV Screening  Never done   Hepatitis C Screening  Never done    There are no preventive care reminders to display for this patient.   Lab Results  Component Value Date   TSH 1.37 03/02/2021   Lab Results  Component Value Date   WBC 14.5 (H) 03/02/2021   HGB 16.2 03/02/2021   HCT 46.7 03/02/2021   MCV 91.2 03/02/2021   PLT 305 03/02/2021   Lab Results  Component Value Date   NA 140 03/02/2021   K 4.4 03/02/2021   CO2 30 03/02/2021   GLUCOSE 78 03/02/2021   BUN 14 03/02/2021   CREATININE 1.13 03/02/2021   BILITOT 0.4 03/02/2021   ALKPHOS 58 07/07/2018   AST 18 03/02/2021   ALT 18 03/02/2021   PROT 6.6 03/02/2021   ALBUMIN 4.2 07/07/2018   CALCIUM 9.1 03/02/2021   ANIONGAP 10 07/07/2018   Lab Results  Component Value Date   CHOL 197 03/02/2021   Lab Results  Component Value Date   HDL 43 03/02/2021   Lab Results  Component Value Date   LDLCALC 128 (H) 03/02/2021   Lab Results  Component Value Date   TRIG 147 03/02/2021   Lab Results  Component Value Date   CHOLHDL 4.6 03/02/2021   Lab Results  Component Value Date   HGBA1C 5.1 03/02/2021       Assessment & Plan:   1. Need for influenza vaccination - Flu Vaccine QUAD 6+ mos PF IM (Fluarix Quad PF)  2. Anxiety and depression Pt already weaned himself off of Lexapro. Will try switching to Trintellix to see if he has fewer adverse effects. May need to wait for insurance approval. He has failed 2 SSRI medications. Information sheet attached to AVS. Recommend f/u with PCP in 4 weeks. - vortioxetine HBr (TRINTELLIX) 10 MG TABS tablet; Take 1 tablet (10 mg total) by mouth daily.  Dispense: 30 tablet;  Refill: 3  3. Elevated blood pressure reading Likely pain related. Recommend he check at home for the next few weeks and let us know if levels remain elevated. Will recheck at next visit if home readings are fine.   4. DDD (degenerative disc disease), thoracolumbar He does not want to take another prednisone burst as this has not helped in the past. He would like Dr. Melvia Heaps input. He is also waiting to hear back from pain management clinic  about adjusting his medications.   Patient aware of signs/symptoms requiring further/urgent evaluation.   Follow-up with PCP in 4 weeks to reassess mood.  Lollie Marrow Reola Calkins, DNP, FNP-C       Clayborne Dana, NP

## 2021-04-19 DIAGNOSIS — S63413D Traumatic rupture of collateral ligament of left middle finger at metacarpophalangeal and interphalangeal joint, subsequent encounter: Secondary | ICD-10-CM | POA: Diagnosis not present

## 2021-04-26 ENCOUNTER — Other Ambulatory Visit: Payer: Self-pay | Admitting: Sports Medicine

## 2021-04-26 DIAGNOSIS — S63413D Traumatic rupture of collateral ligament of left middle finger at metacarpophalangeal and interphalangeal joint, subsequent encounter: Secondary | ICD-10-CM | POA: Diagnosis not present

## 2021-04-27 DIAGNOSIS — G8929 Other chronic pain: Secondary | ICD-10-CM | POA: Diagnosis not present

## 2021-04-27 DIAGNOSIS — M62838 Other muscle spasm: Secondary | ICD-10-CM | POA: Diagnosis not present

## 2021-04-27 DIAGNOSIS — M542 Cervicalgia: Secondary | ICD-10-CM | POA: Diagnosis not present

## 2021-04-27 DIAGNOSIS — Z79899 Other long term (current) drug therapy: Secondary | ICD-10-CM | POA: Diagnosis not present

## 2021-04-27 DIAGNOSIS — M545 Low back pain, unspecified: Secondary | ICD-10-CM | POA: Diagnosis not present

## 2021-05-02 DIAGNOSIS — G5603 Carpal tunnel syndrome, bilateral upper limbs: Secondary | ICD-10-CM | POA: Diagnosis not present

## 2021-05-02 DIAGNOSIS — G250 Essential tremor: Secondary | ICD-10-CM | POA: Diagnosis not present

## 2021-05-02 DIAGNOSIS — M545 Low back pain, unspecified: Secondary | ICD-10-CM | POA: Diagnosis not present

## 2021-05-02 DIAGNOSIS — G43909 Migraine, unspecified, not intractable, without status migrainosus: Secondary | ICD-10-CM | POA: Diagnosis not present

## 2021-05-10 ENCOUNTER — Ambulatory Visit: Payer: Medicare Other | Admitting: Sports Medicine

## 2021-06-04 ENCOUNTER — Other Ambulatory Visit: Payer: Self-pay | Admitting: Sports Medicine

## 2021-06-04 DIAGNOSIS — F419 Anxiety disorder, unspecified: Secondary | ICD-10-CM

## 2021-06-07 DIAGNOSIS — S63653A Sprain of metacarpophalangeal joint of left middle finger, initial encounter: Secondary | ICD-10-CM | POA: Diagnosis not present

## 2021-07-13 ENCOUNTER — Encounter: Payer: Medicare Other | Admitting: Sports Medicine

## 2021-07-20 ENCOUNTER — Encounter: Payer: Medicare Other | Admitting: Sports Medicine

## 2021-07-26 ENCOUNTER — Other Ambulatory Visit: Payer: Self-pay

## 2021-07-26 ENCOUNTER — Ambulatory Visit (INDEPENDENT_AMBULATORY_CARE_PROVIDER_SITE_OTHER): Payer: Medicare Other | Admitting: Sports Medicine

## 2021-07-26 ENCOUNTER — Ambulatory Visit (INDEPENDENT_AMBULATORY_CARE_PROVIDER_SITE_OTHER): Payer: Medicare Other

## 2021-07-26 VITALS — BP 112/71 | HR 48 | Ht 70.0 in | Wt 185.0 lb

## 2021-07-26 DIAGNOSIS — Z136 Encounter for screening for cardiovascular disorders: Secondary | ICD-10-CM

## 2021-07-26 DIAGNOSIS — G5603 Carpal tunnel syndrome, bilateral upper limbs: Secondary | ICD-10-CM | POA: Diagnosis not present

## 2021-07-26 DIAGNOSIS — M25474 Effusion, right foot: Secondary | ICD-10-CM

## 2021-07-26 DIAGNOSIS — Z0000 Encounter for general adult medical examination without abnormal findings: Secondary | ICD-10-CM

## 2021-07-26 NOTE — Assessment & Plan Note (Signed)
Bilateral carpal tunnel syndrome, he is post carpal tunnel release, unfortunately continued to have cramping sensations in the left thenar eminence, no thenar atrophy. No symptoms or signs of trigger thumb. We will try a carpal tunnel injection today/Hydro dissection. Further questions need to be deferred to Dr. Grandville Silos, though I think he will need a hand provider in [place] when he moves.

## 2021-07-26 NOTE — Assessment & Plan Note (Signed)
Referral back to Dr. Ardelle Anton.

## 2021-07-26 NOTE — Assessment & Plan Note (Signed)
Welcome to Medicare exam today, he is up-to-date on labs. Up-to-date on screening tests. He and his family do plan to move to [place] this summer.

## 2021-07-26 NOTE — Progress Notes (Signed)
Subjective:   Shaun Camacho is a 36 y.o. male who presents for a Welcome to Medicare exam.   Review of Systems: Comprehensive review of systems negative except as noted below in the assessment and plan.     Objective:    Today's Vitals   07/26/21 0938  BP: 112/71  Pulse: (!) 48  SpO2: 100%  Weight: 185 lb (83.9 kg)  Height: 5\' 10"  (1.778 m)   Body mass index is 26.54 kg/m.  Medications Outpatient Encounter Medications as of 07/26/2021  Medication Sig   acetaminophen (TYLENOL) 325 MG tablet Take 2 tablets (650 mg total) by mouth every 6 (six) hours.   ALPRAZolam (XANAX) 0.5 MG tablet Take 1 tablet (0.5 mg total) by mouth 3 (three) times daily as needed for anxiety.   clobetasol cream (TEMOVATE) 0.05 % Apply 1 application topically 2 (two) times daily.   DUEXIS 800-26.6 MG TABS TAKE 1 TABLET BY MOUTH 1 TIME EVERY 12 HOURS AS NEEDED   NEEDLE, DISP, 18 G (BD HYPODERMIC NEEDLE) 18G X 1" MISC USE DAILY AS DIRECTED   NURTEC 75 MG TBDP Take 1 tablet by mouth every other day.   omeprazole (PRILOSEC) 40 MG capsule TAKE ONE CAPSULE BY MOUTH TWICE DAILY WITH A MEAL   oxyCODONE-acetaminophen (PERCOCET) 7.5-325 MG tablet SMARTSIG:1 Tablet(s) By Mouth Every 12 Hours   SYRINGE-NEEDLE, DISP, 3 ML (B-D 3CC LUER-LOK SYR 22GX1-1/2) 22G X 1-1/2" 3 ML MISC USE DAILY AS DIRECTED   tadalafil (CIALIS) 5 MG tablet TAKE 1 AND 1/2 TABLET BY MOUTH DAILY   testosterone cypionate (DEPOTESTOSTERONE CYPIONATE) 200 MG/ML injection Inject 1 mL (200 mg total) into the muscle every 14 (fourteen) days.   tiZANidine (ZANAFLEX) 2 MG tablet TAKE 1 TABLET BY MOUTH 1 TIME EVERY 12 HOURS AS NEEDED   vortioxetine HBr (TRINTELLIX) 10 MG TABS tablet Take 1 tablet (10 mg total) by mouth daily.   [DISCONTINUED] amitriptyline (ELAVIL) 50 MG tablet Take 1 tablet (50 mg total) by mouth at bedtime. (Patient not taking: Reported on 04/12/2021)   [DISCONTINUED] ENBREL SURECLICK 50 MG/ML injection Inject into the skin once a  week.   [DISCONTINUED] GuanFACINE HCl 3 MG TB24 Take 1 tablet by mouth daily. (Patient not taking: Reported on 04/12/2021)   [DISCONTINUED] HYDROcodone-acetaminophen (NORCO) 7.5-325 MG tablet SMARTSIG:1 Tablet(s) By Mouth Every 12 Hours PRN   [DISCONTINUED] pregabalin (LYRICA) 300 MG capsule Take 1 capsule (300 mg total) by mouth 2 (two) times daily.   No facility-administered encounter medications on file as of 07/26/2021.     History: Past Medical History:  Diagnosis Date   Anxiety and depression 08/23/2016   08/23/2016 PHQ9 = 6, GAD7 = 7 09/12/2016 PHQ9 = 6, GAD7 = 3 10/11/2016 PHQ9 = 1, GAD7 = 1 12/27/2016 PHQ9 = 3, GAD7 = 4 01/24/2017 PHQ9 = 2, GAD7 = 5   Arthritis    Chronic headache    Complication of anesthesia    when wakes up gets agitated    GERD (gastroesophageal reflux disease) 12/29/2015   Male hypogonadism 01/12/2016   Radiculitis of right cervical region 03/22/2016   MRI from [facility] shows moderate to severe right C6-C7 neuroforaminal stenosis with mass-effect on the exiting right C7 nerve root.   Sleep apnea    uses cpap   Past Surgical History:  Procedure Laterality Date   ANTERIOR CERVICAL DECOMP/DISCECTOMY FUSION N/A 07/15/2018   Procedure: ANTERIOR CERVICAL DECOMPRESSION FUSION, CERVICAL FIVE-SIX, CERVICAL SIX-SEVEN WITH INSTRUMENTATION AND ALLOGRAFT;  Surgeon: 09/13/2018, MD;  Location: [facility] OR;  Service: Orthopedics;  Laterality: N/A;   CARPAL TUNNEL RELEASE Bilateral    07/2019, 11/2019   LATERAL EPICONDYLE RELEASE Right    PILONIDAL CYST EXCISION N/A 09/03/2018   Procedure: EXCISION OF PILONIDAL DISEASE;  Surgeon: Karie Soda, MD;  Location: [facility] ORS;  Service: General;  Laterality: N/A;   SURGERY SCROTAL / TESTICULAR     ULNAR NERVE TRANSPOSITION Left 02/28/2020   Procedure: LEFT ULNAR NEUROPLASTY AT HE ELBOW;  Surgeon: Mack Hook, MD;  Location: [facility];  Service: Orthopedics;  Laterality: Left;    Family History  Problem Relation Age  of Onset   Thyroid disease Father    Thyroid disease Sister    Diabetes Maternal Grandmother    Colon cancer Maternal Grandfather    Prostate cancer Maternal Grandfather    Colon cancer Other        Maternal uncle son    Heart disease Maternal Great-grandmother    Prostate cancer Maternal Uncle    Esophageal cancer Neg Hx    Rectal cancer Neg Hx    Stomach cancer Neg Hx    Social History   Occupational History   Not on file  Tobacco Use   Smoking status: Every Day    Packs/day: 0.50    Years: 18.00    Pack years: 9.00    Types: Cigarettes   Smokeless tobacco: Current    Types: Chew  Vaping Use   Vaping Use: Never used  Substance and Sexual Activity   Alcohol use: Yes    Comment: rare   Drug use: No   Sexual activity: Not on file   Tobacco Counseling Ready to quit: Not Answered Counseling given: Not Answered   Immunizations and Health Maintenance Immunization History  Administered Date(s) Administered   Influenza Split 03/31/2020   Influenza,inj,Quad PF,6+ Mos 04/12/2021   Tdap 02/11/2013   Health Maintenance Due  Topic Date Due   Pneumococcal Vaccine 58-35 Years old (1 - PCV) Never done   HIV Screening  Never done   Hepatitis C Screening  Never done    Activities of Daily Living No flowsheet data found.  Physical Exam Unremarkable physical  Advanced Directives: Full code  Procedure: Real-time Ultrasound Guided hydrodissection of the left median nerve at the carpal tunnel Device: Samsung HS60 Verbal informed consent obtained.  Time-out conducted.  Noted no overlying erythema, induration, or other signs of local infection.  Skin prepped in a sterile fashion.  Local anesthesia: Topical Ethyl chloride.  With sterile technique and under real time ultrasound guidance: Noted normal-appearing median nerve.  Using a 25-gauge needle advanced into the carpal tunnel, taking care to avoid intraneural injection I injected medication both superficial to and deep to  the median nerve freeing it from surrounding structures, I then redirected the needle deep and injected further medication around the flexor tendons deep within the carpal tunnel for a total of 1 cc kenalog 40, 5 cc 1% lidocaine without epinephrine. Completed without difficulty  Advised to call if fevers/chills, erythema, induration, drainage, or persistent bleeding.  Images permanently stored and available for review in PACS.  Impression: Technically successful ultrasound guided median nerve hydrodissection.  Twelve-lead ECG performed and interpreted by me, normal sinus rhythm, sinus bradycardia at 50 bpm, normal axis, normal PR segment, normal QRS complexes and ST segments.    Assessment:    This is a routine wellness  examination for this patient .    Dietary issues and exercise activities discussed: Healthier diet, increasing  exercise, this is allowed him to come off of his psychotropics.     Goals   None     Depression Screen PHQ 2/9 Scores 04/12/2021 09/28/2020 08/31/2020 01/24/2017  PHQ - 2 Score 2 4 4  0  PHQ- 9 Score 11 13 20 2      Fall Risk Fall Risk  04/12/2021  Falls in the past year? 1  Number falls in past yr: 1  Injury with Fall? 0  Risk for fall due to : Impaired balance/gait  Follow up Falls evaluation completed   Cognitive Function: Normal 6CIT Screen 07/26/2021  What Year? 0 points  What month? 0 points  What time? 0 points  Count back from 20 0 points  Months in reverse 0 points  Repeat phrase 2 points  Total Score 2    Patient Care Team: Monica Bectonhekkekandam, Khamiya Varin J, MD as PCP - General (Family Medicine) Karie SodaGross, Steven, MD as Consulting Physician (General Surgery) Estill Bambergumonski, Mark, MD as Consulting Physician (Orthopedic Surgery)     Plan:   Swelling of first metatarsophalangeal (MTP) joint of right foot Referral back to Dr. Ardelle AntonWagoner.  Annual physical exam Welcome to Medicare exam today, he is up-to-date on labs. Up-to-date on screening tests. He and his  [place] do plan [place].  Carpal tunnel syndrome Bilateral carpal tunnel syndrome, he is post carpal tunnel release, unfortunately continued to have cramping sensations in the left thenar eminence, no thenar atrophy. No symptoms or signs of trigger thumb. We will try a carpal tunnel injection today/Hydro dissection. Further questions need to be deferred to Dr. Janee Mornhompson, though I think he will need a hand provider in [place] when he moves.   I have personally reviewed and noted the following in the patients chart:   Medical and social history Use of alcohol, tobacco or illicit drugs  Current medications and supplements Functional ability and status Nutritional status Physical activity Advanced directives List of other physicians Hospitalizations, surgeries, and ER visits in previous 12 months Vitals Screenings to include cognitive, depression, and falls Referrals and appointments  In addition, I have reviewed and discussed with patient certain preventive protocols, quality metrics, and best practice recommendations. A written personalized care plan for preventive services as well as general preventive health recommendations were provided to patient.   We also managed a chronic process with exacerbation and pharmacologic intervention.  ___________________________________________ Ihor Austinhomas J. Benjamin Stainhekkekandam, M.D., ABFM., CAQSM. Primary Care and Sports Medicine [facility]  Adjunct Instructor of Family Medicine  [employer]

## 2021-07-27 ENCOUNTER — Other Ambulatory Visit: Payer: Self-pay | Admitting: Sports Medicine

## 2021-07-27 DIAGNOSIS — E291 Testicular hypofunction: Secondary | ICD-10-CM

## 2021-07-30 ENCOUNTER — Encounter: Payer: Self-pay | Admitting: Podiatry

## 2021-07-30 ENCOUNTER — Other Ambulatory Visit: Payer: Self-pay

## 2021-07-30 ENCOUNTER — Ambulatory Visit: Payer: Medicare Other | Admitting: Podiatry

## 2021-07-30 ENCOUNTER — Ambulatory Visit (INDEPENDENT_AMBULATORY_CARE_PROVIDER_SITE_OTHER): Payer: Medicare Other

## 2021-07-30 DIAGNOSIS — M79671 Pain in right foot: Secondary | ICD-10-CM | POA: Diagnosis not present

## 2021-07-30 DIAGNOSIS — M2011 Hallux valgus (acquired), right foot: Secondary | ICD-10-CM

## 2021-07-30 NOTE — Patient Instructions (Addendum)
Pre-Operative Instructions  Congratulations, you have decided to take an important step to improving your quality of life.  You can be assured that the doctors of [facility] will be with you every step of the way.  Plan to be at the surgery center/hospital at least 1 (one) hour prior to your scheduled time unless otherwise directed by the surgical center/hospital staff.  You must have a responsible adult accompany you, remain during the surgery and drive you home.  Make sure you have directions to the surgical center/hospital and know how to get there on time. For hospital based surgery you will need to obtain a history and physical form from your family physician within 1 month prior to the date of surgery- we will give you a form for you primary physician.  We make every effort to accommodate the date you request for surgery.  There are however, times where surgery dates or times have to be moved.  We will contact you as soon as possible if a change in schedule is required.   No Aspirin/Ibuprofen for one week before surgery.  If you are on aspirin, any non-steroidal anti-inflammatory medications (Mobic, Aleve, Ibuprofen) you should stop taking it 7 days prior to your surgery.  You make take Tylenol  For pain prior to surgery.  Medications- If you are taking daily heart and blood pressure medications, seizure, reflux, allergy, asthma, anxiety, pain or diabetes medications, make sure the surgery center/hospital is aware before the day of surgery so they may notify you which medications to take or avoid the day of surgery. No food or drink after midnight the night before surgery unless directed otherwise by surgical center/hospital staff. No alcoholic beverages 24 hours prior to surgery.  No smoking 24 hours prior to or 24 hours after surgery. Wear loose pants or shorts- loose enough to fit over bandages, boots, and casts. No slip on shoes, sneakers are best. Bring your boot with you to the  surgery center/hospital.  Also bring crutches or a walker if your physician has prescribed it for you.  If you do not have this equipment, it will be provided for you after surgery. If you have not been contracted by the surgery center/hospital by the day before your surgery, call to confirm the date and time of your surgery. Leave-time from work may vary depending on the type of surgery you have.  Appropriate arrangements should be made prior to surgery with your employer. Prescriptions will be provided immediately following surgery by your doctor.  Have these filled as soon as possible after surgery and take the medication as directed. Remove nail polish on the operative foot. Wash the night before surgery.  The night before surgery wash the foot and leg well with the antibacterial soap provided and water paying special attention to beneath the toenails and in between the toes.  Rinse thoroughly with water and dry well with a towel.  Perform this wash unless told not to do so by your physician.  Enclosed: 1 Ice pack (please put in freezer the night before surgery)   1 Hibiclens skin cleaner   Pre-op Instructions  If you have any questions regarding the instructions, do not hesitate to call our office at any point during this process.   [place]: [address] [place], [place] [address] [phone]  [place]: [address] [place], [place] [address] [phone]  Dr. Ovid CurdMatthew Lenise Jr, DPM   Plantar Fasciitis (Heel Spur Syndrome) with Rehab The plantar fascia is a fibrous, ligament-like,  soft-tissue structure that spans the bottom of the foot. Plantar fasciitis is a condition that causes pain in the foot due to inflammation of the tissue. SYMPTOMS  Pain and tenderness on the underneath side of the foot. Pain that worsens with standing or walking. CAUSES  Plantar fasciitis is caused by irritation and injury to the plantar fascia on the underneath side of the foot. Common mechanisms of  injury include: Direct trauma to bottom of the foot. Damage to a small nerve that runs under the foot where the main fascia attaches to the heel bone. Stress placed on the plantar fascia due to bone spurs. RISK INCREASES WITH:  Activities that place stress on the plantar fascia (running, jumping, pivoting, or cutting). Poor strength and flexibility. Improperly fitted shoes. Tight calf muscles. Flat feet. Failure to warm-up properly before activity. Obesity. PREVENTION Warm up and stretch properly before activity. Allow for adequate recovery between workouts. Maintain physical fitness: Strength, flexibility, and endurance. Cardiovascular fitness. Maintain a health body weight. Avoid stress on the plantar fascia. Wear properly fitted shoes, including arch supports for individuals who have flat feet.  PROGNOSIS  If treated properly, then the symptoms of plantar fasciitis usually resolve without surgery. However, occasionally surgery is necessary.  RELATED COMPLICATIONS  Recurrent symptoms that may result in a chronic condition. Problems of the lower back that are caused by compensating for the injury, such as limping. Pain or weakness of the foot during push-off following surgery. Chronic inflammation, scarring, and partial or complete fascia tear, occurring more often from repeated injections.  TREATMENT  Treatment initially involves the use of ice and medication to help reduce pain and inflammation. The use of strengthening and stretching exercises may help reduce pain with activity, especially stretches of the Achilles tendon. These exercises may be performed at home or with a therapist. Your caregiver may recommend that you use heel cups of arch supports to help reduce stress on the plantar fascia. Occasionally, corticosteroid injections are given to reduce inflammation. If symptoms persist for greater than 6 months despite non-surgical (conservative), then surgery may be  recommended.   MEDICATION  If pain medication is necessary, then nonsteroidal anti-inflammatory medications, such as aspirin and ibuprofen, or other minor pain relievers, such as acetaminophen, are often recommended. Do not take pain medication within 7 days before surgery. Prescription pain relievers may be given if deemed necessary by your caregiver. Use only as directed and only as much as you need. Corticosteroid injections may be given by your caregiver. These injections should be reserved for the most serious cases, because they may only be given a certain number of times.  HEAT AND COLD Cold treatment (icing) relieves pain and reduces inflammation. Cold treatment should be applied for 10 to 15 minutes every 2 to 3 hours for inflammation and pain and immediately after any activity that aggravates your symptoms. Use ice packs or massage the area with a piece of ice (ice massage). Heat treatment may be used prior to performing the stretching and strengthening activities prescribed by your caregiver, physical therapist, or athletic trainer. Use a heat pack or soak the injury in warm water.  SEEK IMMEDIATE MEDICAL CARE IF: Treatment seems to offer no benefit, or the condition worsens. Any medications produce adverse side effects.  EXERCISES- RANGE OF MOTION (ROM) AND STRETCHING EXERCISES - Plantar Fasciitis (Heel Spur Syndrome) These exercises may help you when beginning to rehabilitate your injury. Your symptoms may resolve with or without further involvement from your physician, physical therapist or  Event organiserathletic trainer. While completing these exercises, remember:  Restoring tissue flexibility helps normal motion to return to the joints. This allows healthier, less painful movement and activity. An effective stretch should be held for at least 30 seconds. A stretch should never be painful. You should only feel a gentle lengthening or release in the stretched tissue.  RANGE OF MOTION - Toe  Extension, Flexion Sit with your right / left leg crossed over your opposite knee. Grasp your toes and gently pull them back toward the top of your foot. You should feel a stretch on the bottom of your toes and/or foot. Hold this stretch for 10 seconds. Now, gently pull your toes toward the bottom of your foot. You should feel a stretch on the top of your toes and or foot. Hold this stretch for 10 seconds. Repeat  times. Complete this stretch 3 times per day.   RANGE OF MOTION - Ankle Dorsiflexion, Active Assisted Remove shoes and sit on a chair that is preferably not on a carpeted surface. Place right / left foot under knee. Extend your opposite leg for support. Keeping your heel down, slide your right / left foot back toward the chair until you feel a stretch at your ankle or calf. If you do not feel a stretch, slide your bottom forward to the edge of the chair, while still keeping your heel down. Hold this stretch for 10 seconds. Repeat 3 times. Complete this stretch 2 times per day.   STRETCH  Gastroc, Standing Place hands on wall. Extend right / left leg, keeping the front knee somewhat bent. Slightly point your toes inward on your back foot. Keeping your right / left heel on the floor and your knee straight, shift your weight toward the wall, not allowing your back to arch. You should feel a gentle stretch in the right / left calf. Hold this position for 10 seconds. Repeat 3 times. Complete this stretch 2 times per day.  STRETCH  Soleus, Standing Place hands on wall. Extend right / left leg, keeping the other knee somewhat bent. Slightly point your toes inward on your back foot. Keep your right / left heel on the floor, bend your back knee, and slightly shift your weight over the back leg so that you feel a gentle stretch deep in your back calf. Hold this position for 10 seconds. Repeat 3 times. Complete this stretch 2 times per day.  STRETCH  Gastrocsoleus, Standing  Note: This  exercise can place a lot of stress on your foot and ankle. Please complete this exercise only if specifically instructed by your caregiver.  Place the ball of your right / left foot on a step, keeping your other foot firmly on the same step. Hold on to the wall or a rail for balance. Slowly lift your other foot, allowing your body weight to press your heel down over the edge of the step. You should feel a stretch in your right / left calf. Hold this position for 10 seconds. Repeat this exercise with a slight bend in your right / left knee. Repeat 3 times. Complete this stretch 2 times per day.   STRENGTHENING EXERCISES - Plantar Fasciitis (Heel Spur Syndrome)  These exercises may help you when beginning to rehabilitate your injury. They may resolve your symptoms with or without further involvement from your physician, physical therapist or athletic trainer. While completing these exercises, remember:  Muscles can gain both the endurance and the strength needed for everyday activities through controlled  exercises. Complete these exercises as instructed by your physician, physical therapist or athletic trainer. Progress the resistance and repetitions only as guided.  STRENGTH - Towel Curls Sit in a chair positioned on a non-carpeted surface. Place your foot on a towel, keeping your heel on the floor. Pull the towel toward your heel by only curling your toes. Keep your heel on the floor. Repeat 3 times. Complete this exercise 2 times per day.  STRENGTH - Ankle Inversion Secure one end of a rubber exercise band/tubing to a fixed object (table, pole). Loop the other end around your foot just before your toes. Place your fists between your knees. This will focus your strengthening at your ankle. Slowly, pull your big toe up and in, making sure the band/tubing is positioned to resist the entire motion. Hold this position for 10 seconds. Have your muscles resist the band/tubing as it slowly pulls  your foot back to the starting position. Repeat 3 times. Complete this exercises 2 times per day.  Document Released: 06/24/2005 Document Revised: 09/16/2011 Document Reviewed: 10/06/2008 [facility] Patient Information 2014 [place], [place].

## 2021-08-02 ENCOUNTER — Telehealth: Payer: Self-pay | Admitting: Urology

## 2021-08-02 NOTE — Telephone Encounter (Signed)
DOS - 08/08/21  [place] BUNIONECTOMY RIGHT --- 29528 VS [place] MPJ FUSION RIGHT --- [id number]   [employer]BS EFFECTIVE DATE - 07/08/21  PLAN DEDUCTIBLE - $0.00 OUT OF POCKET - $5,650.00 W/ $5,635.00  REMAINING COINSURANCE -  $5,650.00 W/ $5,635.00  REMAINING COPAY - $0.00   NO PRIOR AUTH REQUIRED

## 2021-08-03 DIAGNOSIS — Z79899 Other long term (current) drug therapy: Secondary | ICD-10-CM | POA: Diagnosis not present

## 2021-08-03 DIAGNOSIS — M542 Cervicalgia: Secondary | ICD-10-CM | POA: Diagnosis not present

## 2021-08-03 DIAGNOSIS — M62838 Other muscle spasm: Secondary | ICD-10-CM | POA: Diagnosis not present

## 2021-08-03 DIAGNOSIS — M545 Low back pain, unspecified: Secondary | ICD-10-CM | POA: Diagnosis not present

## 2021-08-03 DIAGNOSIS — G8929 Other chronic pain: Secondary | ICD-10-CM | POA: Diagnosis not present

## 2021-08-03 NOTE — Progress Notes (Signed)
Subjective: 36 year old male presents the office today to reestablish care to further discuss surgery for his right foot.  He points to the first MPJ where he gets the majority discomfort.  Previously scheduled for surgery he had to reschedule.  He wants to proceed with this given ongoing pain.  Tried shoe modifications, offloading padding without any significant improvement.  No recent injury or changes otherwise since I last saw him.  Objective: AAO x3, NAD DP/PT pulses palpable bilaterally, CRT less than 3 seconds Exam there is moderate bunion present with tenderness palpation to the medial first metatarsal head and mild discomfort on the sesamoids.  There is no pain or crepitation MPJ range of motion.  No area of pinpoint tenderness otherwise.  Flexor, extensor tendons appear to be intact.  MMT 5/5. No pain with calf compression, swelling, warmth, erythema  Assessment: Bunion right foot  Plan: -All treatment options discussed with the patient including all alternatives, risks, complications.  -Repeat x-rays obtained reviewed.  Moderate bunion is present with increased intermetatarsal angle.  Subluxation sesamoids. -Patient discussed with conservative as well as surgical intervention.  He is attempted numerous conservative treatments and he wants to proceed with surgery at this point.  Discussed the likely first metatarsal ostomy, Austin bunionectomy.  We discussed previously given some concern of arthritis in the joint possible for first metatarsal phalangeal joint arthrodesis.  I added this to the consent form today but likely will be able to do an [employer]. -The incision placement as well as the postoperative course was discussed with the patient. I discussed risks of the surgery which include, but not limited to, infection, bleeding, pain, swelling, need for further surgery, delayed or nonhealing, painful or ugly scar, numbness or sensation changes, over/under correction,  recurrence, transfer lesions, further deformity, hardware failure, DVT/PE, loss of toe/foot. Patient understands these risks and wishes to proceed with surgery. The surgical consent was reviewed with the patient all 3 pages were signed. No promises or guarantees were given to the outcome of the procedure. All questions were answered to the best of my ability. Before the surgery the patient was encouraged to call the office if there is any further questions. The surgery will be performed at the [facility] on an outpatient basis. -CAM boot dispensed for postoperative use -After the patient left he was in the sub-waiting room and asked me about arch pain.  Continue shoes and good arch support.  Fixing the bunion likely will not correct that. -Patient encouraged to call the office with any questions, concerns, change in symptoms.   Vivi Barrack DPM

## 2021-08-08 ENCOUNTER — Other Ambulatory Visit: Payer: Self-pay | Admitting: Podiatry

## 2021-08-08 ENCOUNTER — Encounter: Payer: Self-pay | Admitting: Podiatry

## 2021-08-08 DIAGNOSIS — M25571 Pain in right ankle and joints of right foot: Secondary | ICD-10-CM | POA: Diagnosis not present

## 2021-08-08 DIAGNOSIS — M2011 Hallux valgus (acquired), right foot: Secondary | ICD-10-CM | POA: Diagnosis not present

## 2021-08-08 DIAGNOSIS — M21611 Bunion of right foot: Secondary | ICD-10-CM | POA: Diagnosis not present

## 2021-08-08 MED ORDER — CEPHALEXIN 500 MG PO CAPS: 500.0000 mg | ORAL_CAPSULE | Freq: Three times a day (TID) | ORAL | 0 refills | Status: DC

## 2021-08-08 NOTE — Progress Notes (Signed)
Sent postop medications  °

## 2021-08-10 ENCOUNTER — Telehealth: Payer: Self-pay | Admitting: *Deleted

## 2021-08-10 ENCOUNTER — Other Ambulatory Visit: Payer: Self-pay | Admitting: Podiatry

## 2021-08-10 ENCOUNTER — Encounter: Payer: Self-pay | Admitting: Podiatry

## 2021-08-10 MED ORDER — OXYCODONE-ACETAMINOPHEN 10-325 MG PO TABS: 1.0000 | ORAL_TABLET | ORAL | 0 refills | Status: AC | PRN

## 2021-08-10 NOTE — Progress Notes (Signed)
Patient called stating that he is taking 2 tablets of the percocet 7.5 every 4 hours and that helps the pain. I have increased it ito 10/325 once every 4 hours for the weekend. He is in agreement to that and he understands not to take the percocet 7.5 as prescribed while on the higher dose.

## 2021-08-10 NOTE — Telephone Encounter (Signed)
Patient called again he is in a extreme amount of pain , he is doubling up his medication to keep the pain under control . Needs a call back asap to let him know what the best course of action is.

## 2021-08-10 NOTE — Telephone Encounter (Signed)
Patient is calling to request something stronger for pain, not able to sleep.  He originally thought that the current pain meds taking would be enough,but is not helping. Please advise.

## 2021-08-11 NOTE — Telephone Encounter (Signed)
I called the patient to discuss his pain on 08/10/2021. He states that he has been taking 2 of the Percocet 7.5/325 every 4 hours to control his pain. He contacted his pain management doctor and they do not prescribe post-op pain medications. I increased it to Percocet 10/325 1 every 4 hours for now. He feels that he needs something stronger just through the weekend. He had no other concerns or questions.

## 2021-08-14 ENCOUNTER — Other Ambulatory Visit: Payer: Self-pay

## 2021-08-14 ENCOUNTER — Ambulatory Visit (INDEPENDENT_AMBULATORY_CARE_PROVIDER_SITE_OTHER): Payer: Medicare Other | Admitting: Podiatry

## 2021-08-14 ENCOUNTER — Ambulatory Visit (INDEPENDENT_AMBULATORY_CARE_PROVIDER_SITE_OTHER): Payer: Medicare Other

## 2021-08-14 DIAGNOSIS — Z9889 Other specified postprocedural states: Secondary | ICD-10-CM

## 2021-08-14 DIAGNOSIS — M21611 Bunion of right foot: Secondary | ICD-10-CM

## 2021-08-18 NOTE — Progress Notes (Signed)
Subjective: Shaun Camacho is a 36 y.o. is seen today in office s/p right foot also bunionectomy preformed on 08/08/2021.  States he is doing much better his pain is better controlled.  Been nonweightbearing in the cam boot.  No injury.  Denies any systemic complaints such as fevers, chills, nausea, vomiting. No calf pain, chest pain, shortness of breath.   Objective: General: No acute distress, AAOx3  DP/PT pulses palpable 2/4, CRT < 3 sec to all digits.  Protective sensation intact. Motor function intact.  Right foot: Incision is well coapted without any evidence of dehiscence with sutures intact. There is no surrounding erythema, ascending cellulitis, fluctuance, crepitus, malodor, drainage/purulence. There is mild edema around the surgical site. There is mild pain along the surgical site.  Toes in rectus position No other areas of tenderness to bilateral lower extremities.  No other open lesions or pre-ulcerative lesions.  No pain with calf compression, swelling, warmth, erythema.   Assessment and Plan:  Status post right foot Austin bunionectomy, doing well with no complications   -Treatment options discussed including all alternatives, risks, and complications -X-rays obtained reviewed.  Status post first metatarsal osteotomy with screw fixation without any complicating factors. -Incision was cleansed.  Small mount of antibiotic ointment and a bandage applied.  Dressing clean, dry, intact.  If needed he can change the bandage and apply a similar bandage. -Nonweightbearing for now.  Can transition to partial weightbearing as tolerated -Ice/elevation -Pain medication as needed. -Monitor for any clinical signs or symptoms of infection and DVT/PE and directed to call the office immediately should any occur or go to the ER. -Follow-up as scheduled or sooner if any problems arise. In the meantime, encouraged to call the office with any questions, concerns, change in symptoms.   Ovid Curd, DPM

## 2021-08-23 ENCOUNTER — Ambulatory Visit (INDEPENDENT_AMBULATORY_CARE_PROVIDER_SITE_OTHER): Payer: Medicare Other | Admitting: Podiatry

## 2021-08-23 ENCOUNTER — Other Ambulatory Visit: Payer: Self-pay

## 2021-08-23 DIAGNOSIS — Z9889 Other specified postprocedural states: Secondary | ICD-10-CM

## 2021-08-23 DIAGNOSIS — M21611 Bunion of right foot: Secondary | ICD-10-CM

## 2021-08-24 ENCOUNTER — Encounter: Payer: Self-pay | Admitting: Podiatry

## 2021-08-26 DIAGNOSIS — M21611 Bunion of right foot: Secondary | ICD-10-CM | POA: Insufficient documentation

## 2021-08-26 NOTE — Progress Notes (Signed)
Subjective: Shaun Camacho is a 36 y.o. is seen today in office s/p right foot also bunionectomy preformed on 08/08/2021.  His pain has been much improved.  He only took pain medicine for the first weekend after the surgery and since then he is back on his normal pain medicine schedule.  He has been in the cam boot.  He has been using crutches and staying off of his foot as well.  No recent injury or changes otherwise.  Denies any fevers or chills.  No chest pain, shortness of breath or calf pain.   Objective: General: No acute distress, AAOx3  DP/PT pulses palpable 2/4, CRT < 3 sec to all digits.  Protective sensation intact. Motor function intact.  Right foot: Incision is well coapted without any evidence of dehiscence with sutures intact.  There is mild edema present but there is no significant tenderness palpation on exam.  No pain with MPJ range of motion.  There is no erythema or warmth or any signs of infection.  Toe is in rectus position. No other open lesions or pre-ulcerative lesions.  No pain with calf compression, swelling, warmth, erythema.   Assessment and Plan:  Status post right foot Austin bunionectomy, doing well with no complications   -Treatment options discussed including all alternatives, risks, and complications -Incisions healing well.  He can wash foot with soap and water and apply a similar bandage.  I will hold off on soaking the foot until his course point.  There is Monocryl suture intact for reinforcement that should come out on its own.  If still present next appointment we will remove them.   -Discussed with him range of motion exercises to start. -Weightbearing as tolerated in cam boot -Ice and elevation -Monitor for any clinical signs or symptoms of infection and DVT/PE and directed to call the office immediately should any occur or go to the ER. -Follow-up as scheduled or sooner if any problems arise. In the meantime, encouraged to call the office with any  questions, concerns, change in symptoms.   *X-ray next appointment  Celesta Gentile, DPM

## 2021-09-04 ENCOUNTER — Ambulatory Visit (INDEPENDENT_AMBULATORY_CARE_PROVIDER_SITE_OTHER): Payer: Medicare Other | Admitting: Podiatry

## 2021-09-04 ENCOUNTER — Ambulatory Visit (INDEPENDENT_AMBULATORY_CARE_PROVIDER_SITE_OTHER): Payer: Medicare Other

## 2021-09-04 ENCOUNTER — Other Ambulatory Visit: Payer: Self-pay

## 2021-09-04 DIAGNOSIS — M21611 Bunion of right foot: Secondary | ICD-10-CM

## 2021-09-04 DIAGNOSIS — Z9889 Other specified postprocedural states: Secondary | ICD-10-CM

## 2021-09-04 DIAGNOSIS — M2011 Hallux valgus (acquired), right foot: Secondary | ICD-10-CM

## 2021-09-05 NOTE — Progress Notes (Signed)
Subjective: Shaun Camacho is a 36 y.o. is seen today in office s/p right foot also bunionectomy preformed on 08/08/2021.  He states he removed the stitches himself yesterday as they are causing irritation.  He is still in the cam boot.  No recent injury or changes.  Denies any fevers or chills.  Has no other concerns.   Objective: General: No acute distress, AAOx3  DP/PT pulses palpable 2/4, CRT < 3 sec to all digits.  Protective sensation intact. Motor function intact.  Right foot: Incision is well coapted without any evidence of dehiscence and scars forming.  There is minimal edema.  There is no erythema or warmth.  No significant tenderness to palpation of the surgical site.  No pain with MPJ range of motion.  No pain with calf compression, swelling, warmth, erythema.   Assessment and Plan:  Status post right foot Austin bunionectomy, doing well with no complications   -Treatment options discussed including all alternatives, risks, and complications -X-rays obtained reviewed.  Hardware intact with any complicating factors. -Remain in cam boot.  Discussed range of motion exercises.  Continue ice elevate.  Discussed about 2 weeks he can start to transition to a shoe as tolerated but gradual increase.  I will see him back in 2 to 3 weeks or sooner if needed.  Shaun Camacho DPM   -Incisions healing well.  He can wash foot with soap and water and apply a similar bandage.  I will hold off on soaking the foot until his course point.  There is Monocryl suture intact for reinforcement that should come out on its own.  If still present next appointment we will remove them.   -Discussed with him range of motion exercises to start. -Weightbearing as tolerated in cam boot -Ice and elevation -Monitor for any clinical signs or symptoms of infection and DVT/PE and directed to call the office immediately should any occur or go to the ER. -Follow-up as scheduled or sooner if any problems arise. In the  meantime, encouraged to call the office with any questions, concerns, change in symptoms.   *X-ray next appointment  Shaun Camacho, DPM

## 2021-09-06 ENCOUNTER — Encounter: Payer: Medicare Other | Admitting: Podiatry

## 2021-09-21 ENCOUNTER — Ambulatory Visit (INDEPENDENT_AMBULATORY_CARE_PROVIDER_SITE_OTHER): Payer: Medicare Other | Admitting: Podiatry

## 2021-09-21 ENCOUNTER — Ambulatory Visit (INDEPENDENT_AMBULATORY_CARE_PROVIDER_SITE_OTHER): Payer: Medicare Other

## 2021-09-21 ENCOUNTER — Other Ambulatory Visit: Payer: Self-pay

## 2021-09-21 DIAGNOSIS — Z9889 Other specified postprocedural states: Secondary | ICD-10-CM

## 2021-09-21 DIAGNOSIS — M21611 Bunion of right foot: Secondary | ICD-10-CM

## 2021-09-21 NOTE — Progress Notes (Signed)
Subjective: ?Lorenso Quirino is a 36 y.o. is seen today in office s/p right foot also bunionectomy preformed on 08/08/2021.  He states he is starting to get back to normal.  He is a little soreness but not having to take anything for the discomfort.  Start on a regular shoe.  He is doing more normal activities but not been doing high-impact with exercise.  No fevers or chills and reports he has no other concerns today.  ? ? ?Objective: ?General: No acute distress, AAOx3  ?DP/PT pulses palpable 2/4, CRT < 3 sec to all digits.  ?Protective sensation intact. Motor function intact.  ?Right foot: Incision is well coapted without any evidence of dehiscence and scars forming.  There is minimal edema still present although improving.  There is no erythema or warmth.  No significant discomfort on exam.  No pain with MPJ range of motion.  No pain in the sesamoids.  No other areas of discomfort.  MMT 5/5.   ?No pain with calf compression, swelling, warmth, erythema.  ? ?Assessment and Plan:  ?Status post right foot Austin bunionectomy, doing well with no complications  ? ?-Treatment options discussed including all alternatives, risks, and complications ?-X-rays obtained reviewed.  Hardware intact with any complicating factors.  Lesion noted across the osteotomy site. ?-At this point discussed he can remain regular shoes and gradual increase activity as tolerated.  Hold off on high-impact activities or running for now she is back to normal activities and gradually increase activity level as tolerated.  Continue to ice and elevate.  Compression to help with any postoperative edema. ? ?Return in about 6 weeks (around 11/02/2021). ? ?Vivi Barrack DPM ?

## 2021-09-26 DIAGNOSIS — M542 Cervicalgia: Secondary | ICD-10-CM | POA: Diagnosis not present

## 2021-09-26 DIAGNOSIS — M545 Low back pain, unspecified: Secondary | ICD-10-CM | POA: Diagnosis not present

## 2021-09-26 DIAGNOSIS — M62838 Other muscle spasm: Secondary | ICD-10-CM | POA: Diagnosis not present

## 2021-09-26 DIAGNOSIS — Z79899 Other long term (current) drug therapy: Secondary | ICD-10-CM | POA: Diagnosis not present

## 2021-09-26 DIAGNOSIS — G8929 Other chronic pain: Secondary | ICD-10-CM | POA: Diagnosis not present

## 2021-10-26 ENCOUNTER — Encounter: Payer: Medicare Other | Admitting: Podiatry

## 2021-10-28 ENCOUNTER — Encounter: Payer: Self-pay | Admitting: Sports Medicine

## 2021-12-17 ENCOUNTER — Ambulatory Visit: Payer: Self-pay

## 2021-12-17 ENCOUNTER — Ambulatory Visit: Payer: Medicare Other | Admitting: Family Medicine

## 2021-12-17 VITALS — BP 138/80 | Ht 69.0 in | Wt 195.0 lb

## 2021-12-17 DIAGNOSIS — M25512 Pain in left shoulder: Secondary | ICD-10-CM

## 2021-12-17 DIAGNOSIS — M778 Other enthesopathies, not elsewhere classified: Secondary | ICD-10-CM | POA: Insufficient documentation

## 2021-12-17 MED ORDER — METHYLPREDNISOLONE ACETATE 40 MG/ML IJ SUSP
40.0000 mg | Freq: Once | INTRAMUSCULAR | Status: AC
  Administered 2021-12-17: 40 mg via INTRA_ARTICULAR

## 2021-12-17 NOTE — Assessment & Plan Note (Signed)
Acutely occurring.  Had previously received injections in the subacromial space.  Symptoms seem less consistent with bursitis today.  AC joint does have degenerative changes but no significant effusion.  Having more anterior pain with working out. -Counseled on home exercise therapy and supportive care. -Injection today. -Could consider physical therapy.

## 2021-12-17 NOTE — Progress Notes (Signed)
Shaun Camacho - 36 y.o. male MRN [id number]  Date of birth: [birth year]  SUBJECTIVE:  Including CC & ROS.  No chief complaint on file.   Shaun Camacho is a 36 y.o. male that is presenting with acute left shoulder pain.  The pain is anterior in nature.  He started noticing the pain with getting back to working out.   Review of Systems See HPI   HISTORY: Past Medical, Surgical, Social, and Family History Reviewed & Updated per EMR.   Pertinent Historical Findings include:  Past Medical History:  Diagnosis Date   Anxiety and depression 08/23/2016   08/23/2016 PHQ9 = 6, GAD7 = 7 09/12/2016 PHQ9 = 6, GAD7 = 3 10/11/2016 PHQ9 = 1, GAD7 = 1 12/27/2016 PHQ9 = 3, GAD7 = 4 01/24/2017 PHQ9 = 2, GAD7 = 5   Arthritis    Chronic headache    Complication of anesthesia    when wakes up gets agitated    GERD (gastroesophageal reflux disease) 12/29/2015   Male hypogonadism 01/12/2016   Radiculitis of right cervical region 03/22/2016   MRI from [facility] shows moderate to severe right C6-C7 neuroforaminal stenosis with mass-effect on the exiting right C7 nerve root.   Sleep apnea    uses cpap    Past Surgical History:  Procedure Laterality Date   ANTERIOR CERVICAL DECOMP/DISCECTOMY FUSION N/A 07/15/2018   Procedure: ANTERIOR CERVICAL DECOMPRESSION FUSION, CERVICAL FIVE-SIX, CERVICAL SIX-SEVEN WITH INSTRUMENTATION AND ALLOGRAFT;  Surgeon: Estill Bamberg, MD;  Location: [facility] OR;  Service: Orthopedics;  Laterality: N/A;   CARPAL TUNNEL RELEASE Bilateral    07/2019, 11/2019   LATERAL EPICONDYLE RELEASE Right    PILONIDAL CYST EXCISION N/A 09/03/2018   Procedure: EXCISION OF PILONIDAL DISEASE;  Surgeon: Karie Soda, MD;  Location: [facility] ORS;  Service: General;  Laterality: N/A;   SURGERY SCROTAL / TESTICULAR     ULNAR NERVE TRANSPOSITION Left 02/28/2020   Procedure: LEFT ULNAR NEUROPLASTY AT HE ELBOW;  Surgeon: Mack Hook, MD;  Location: [facility];  Service: Orthopedics;   Laterality: Left;     PHYSICAL EXAM:  VS: BP 138/80   Ht 5\' 9"  (1.753 m)   Wt 195 lb (88.5 kg)   BMI 28.80 kg/m  Physical Exam Gen: NAD, alert, cooperative with exam, well-appearing MSK:  Neurovascularly intact     Aspiration/Injection Procedure Note Thaer Miyoshi Oct 11, 1985  Procedure: Injection Indications: Left shoulder pain  Procedure Details Consent: Risks of procedure as well as the alternatives and risks of each were explained to the (patient/caregiver).  Consent for procedure obtained. Time Out: Verified patient identification, verified procedure, site/side was marked, verified correct patient position, special equipment/implants available, medications/allergies/relevent history reviewed, required imaging and test results available.  Performed.  The area was cleaned with iodine and alcohol swabs.    The left anterior glenohumeral joint was injected using 1 cc of 1% lidocaine on a 21-gauge 2 inch needle.  The syringe was switched to mixture containing  1 cc's of 40 mg Depo-Medrol and 4 cc's of 0.5% bupivacaine was injected.   Ultrasound was used. Images were obtained in long views showing the injection.     A sterile dressing was applied.  Patient did tolerate procedure well.     ASSESSMENT & PLAN:   Capsulitis of left shoulder Acutely occurring.  Had previously received injections in the subacromial space.  Symptoms seem less consistent with bursitis today.  AC joint does have degenerative changes but no significant effusion.  Having more anterior pain with working  out. -Counseled on home exercise therapy and supportive care. -Injection today. -Could consider physical therapy.

## 2021-12-17 NOTE — Patient Instructions (Signed)
Good to see you Please try heat before exercise and ice after  Please try the exercises   Please send me a message in MyChart with any questions or updates.  Please follow up with Dr. Darene Lamer if the pain is still ongoing in 4-6 weeks.   --Dr. Raeford Razor

## 2022-01-09 ENCOUNTER — Ambulatory Visit (INDEPENDENT_AMBULATORY_CARE_PROVIDER_SITE_OTHER): Payer: Medicare Other | Admitting: Sports Medicine

## 2022-01-09 ENCOUNTER — Ambulatory Visit (INDEPENDENT_AMBULATORY_CARE_PROVIDER_SITE_OTHER): Payer: Medicare Other

## 2022-01-09 DIAGNOSIS — M25512 Pain in left shoulder: Secondary | ICD-10-CM | POA: Diagnosis not present

## 2022-01-09 DIAGNOSIS — G8929 Other chronic pain: Secondary | ICD-10-CM

## 2022-01-09 NOTE — Progress Notes (Signed)
    Procedures performed today:    Procedure: Real-time Ultrasound Guided injection of the left acromioclavicular joint Device: Samsung HS60  Verbal informed consent obtained.  Time-out conducted.  Noted no overlying erythema, induration, or other signs of local infection.  Skin prepped in a sterile fashion.  Local anesthesia: Topical Ethyl chloride.  With sterile technique and under real time ultrasound guidance: Noted minimally arthritic joint with surrounding synovitis, 1/2 cc lidocaine, 1/2 cc kenalog 40 injected easily. Completed without difficulty  Advised to call if fevers/chills, erythema, induration, drainage, or persistent bleeding.  Images permanently stored and available for review in PACS.  Impression: Technically successful ultrasound guided injection.  Independent interpretation of notes and tests performed by another provider:   None.  Brief History, Exam, Impression, and Recommendations:    Chronic left shoulder pain, multifactorial with bursa, glenohumeral and acromioclavicular pain generators Shaun Camacho returns, he is a very pleasant 36 year old male, former Emergency planning/management officer, doing a lot of working out. He has had multifactorial left shoulder pain, historically impingement related, he did well with a subacromial injection back in 2021. More recently he saw Dr. Jordan Likes, symptomatology sounded to be glenohumeral, got a glenohumeral joint injection, did not get relief. 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 Today we injected his acromioclavicular joint for diagnostic and therapeutic purposes. In the meantime he will avoid crossarm weight lifting, overhead press, bench press.  Chronic process with exacerbation and pharmacologic intervention  ____________________________________________ Shaun Camacho. Shaun Camacho, M.D., ABFM., CAQSM., AME. Primary Care and Sports Medicine [place]  [facility]  Adjunct Professor of Family Medicine  [employer]  Restaurant manager, fast food

## 2022-01-09 NOTE — Assessment & Plan Note (Addendum)
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 He has had multifactorial left shoulder pain, historically impingement related, he did well with a subacromial injection back in 2021. More recently he saw Dr. Jordan Likes, symptomatology sounded to be glenohumeral, got a glenohumeral joint injection, did not get relief. 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 Today we injected his acromioclavicular joint for diagnostic and therapeutic purposes. In the meantime he will avoid crossarm weight lifting, overhead press, bench press.

## 2022-01-21 DIAGNOSIS — M545 Low back pain, unspecified: Secondary | ICD-10-CM | POA: Diagnosis not present

## 2022-01-21 DIAGNOSIS — M5137 Other intervertebral disc degeneration, lumbosacral region: Secondary | ICD-10-CM | POA: Diagnosis not present

## 2022-01-21 DIAGNOSIS — G8929 Other chronic pain: Secondary | ICD-10-CM | POA: Diagnosis not present

## 2022-01-21 DIAGNOSIS — Z79899 Other long term (current) drug therapy: Secondary | ICD-10-CM | POA: Diagnosis not present

## 2022-01-21 DIAGNOSIS — M542 Cervicalgia: Secondary | ICD-10-CM | POA: Diagnosis not present

## 2022-01-27 ENCOUNTER — Encounter: Payer: Self-pay | Admitting: Sports Medicine

## 2022-02-27 ENCOUNTER — Encounter: Payer: Self-pay | Admitting: General Practice

## 2022-04-19 DIAGNOSIS — M25512 Pain in left shoulder: Secondary | ICD-10-CM | POA: Diagnosis not present

## 2022-04-19 DIAGNOSIS — M75102 Unspecified rotator cuff tear or rupture of left shoulder, not specified as traumatic: Secondary | ICD-10-CM | POA: Diagnosis not present

## 2022-05-08 ENCOUNTER — Telehealth: Payer: Self-pay | Admitting: Sports Medicine

## 2022-05-08 DIAGNOSIS — E291 Testicular hypofunction: Secondary | ICD-10-CM

## 2022-05-08 NOTE — Telephone Encounter (Signed)
Pt called in this afternoon, requesting a refill on testosterone cypionate,200mg /injectible. He stated that he has moved to [facility] and does not have a new PCP yet. The pharmacy is [facility] [place]. Zip code is [phone].

## 2022-05-09 MED ORDER — TESTOSTERONE CYPIONATE 200 MG/ML IM SOLN: 200.0000 mg | INTRAMUSCULAR | 3 refills | Status: AC

## 2022-05-09 NOTE — Telephone Encounter (Signed)
Patient informed. He states he is in process of acquiring the new PCP .

## 2022-05-09 NOTE — Telephone Encounter (Signed)
Refilled but he really does need to find a new PCP to take over.

## 2022-05-20 ENCOUNTER — Telehealth: Payer: Self-pay | Admitting: Sports Medicine

## 2022-05-20 NOTE — Telephone Encounter (Signed)
Pt called.  Checking on PA for his testosterone. He has moved away but still has not connected with a pcp as yet.

## 2022-05-23 ENCOUNTER — Telehealth: Payer: Self-pay

## 2022-05-23 NOTE — Telephone Encounter (Addendum)
Initiated Prior authorization MKL:[id number] Cypionate 200MG /ML intramuscular solution Via: Covermymeds Case/Key:[id number] Status: approved  as of 05/23/22 Reason:05/21/22-05/22/2023 Notified Pt via: Mychart

## 2022-05-29 DIAGNOSIS — M7552 Bursitis of left shoulder: Secondary | ICD-10-CM | POA: Diagnosis not present

## 2022-05-29 DIAGNOSIS — M75112 Incomplete rotator cuff tear or rupture of left shoulder, not specified as traumatic: Secondary | ICD-10-CM | POA: Diagnosis not present

## 2022-05-29 DIAGNOSIS — M67814 Other specified disorders of tendon, left shoulder: Secondary | ICD-10-CM | POA: Diagnosis not present

## 2022-06-07 DIAGNOSIS — M75102 Unspecified rotator cuff tear or rupture of left shoulder, not specified as traumatic: Secondary | ICD-10-CM | POA: Diagnosis not present

## 2022-06-20 DIAGNOSIS — M13812 Other specified arthritis, left shoulder: Secondary | ICD-10-CM | POA: Diagnosis not present

## 2022-06-20 DIAGNOSIS — M25512 Pain in left shoulder: Secondary | ICD-10-CM | POA: Diagnosis not present

## 2022-07-29 DIAGNOSIS — Z131 Encounter for screening for diabetes mellitus: Secondary | ICD-10-CM | POA: Diagnosis not present

## 2022-07-29 DIAGNOSIS — E291 Testicular hypofunction: Secondary | ICD-10-CM | POA: Diagnosis not present

## 2022-07-29 DIAGNOSIS — Z136 Encounter for screening for cardiovascular disorders: Secondary | ICD-10-CM | POA: Diagnosis not present

## 2022-07-29 DIAGNOSIS — Z1331 Encounter for screening for depression: Secondary | ICD-10-CM | POA: Diagnosis not present

## 2022-09-02 DIAGNOSIS — Z136 Encounter for screening for cardiovascular disorders: Secondary | ICD-10-CM | POA: Diagnosis not present

## 2022-09-02 DIAGNOSIS — H53149 Visual discomfort, unspecified: Secondary | ICD-10-CM | POA: Diagnosis not present

## 2022-09-02 DIAGNOSIS — E291 Testicular hypofunction: Secondary | ICD-10-CM | POA: Diagnosis not present

## 2022-09-02 DIAGNOSIS — M5481 Occipital neuralgia: Secondary | ICD-10-CM | POA: Diagnosis not present

## 2022-09-02 DIAGNOSIS — Z6828 Body mass index (BMI) 28.0-28.9, adult: Secondary | ICD-10-CM | POA: Diagnosis not present

## 2022-09-09 DIAGNOSIS — Z6828 Body mass index (BMI) 28.0-28.9, adult: Secondary | ICD-10-CM | POA: Diagnosis not present

## 2022-09-09 DIAGNOSIS — J0110 Acute frontal sinusitis, unspecified: Secondary | ICD-10-CM | POA: Diagnosis not present

## 2022-09-09 DIAGNOSIS — E291 Testicular hypofunction: Secondary | ICD-10-CM | POA: Diagnosis not present

## 2022-10-21 ENCOUNTER — Encounter: Payer: Self-pay | Admitting: *Deleted

## 2022-12-09 DIAGNOSIS — E291 Testicular hypofunction: Secondary | ICD-10-CM | POA: Diagnosis not present

## 2022-12-18 DIAGNOSIS — M6530 Trigger finger, unspecified finger: Secondary | ICD-10-CM | POA: Diagnosis not present

## 2022-12-18 DIAGNOSIS — Z6828 Body mass index (BMI) 28.0-28.9, adult: Secondary | ICD-10-CM | POA: Diagnosis not present

## 2022-12-18 DIAGNOSIS — S99921A Unspecified injury of right foot, initial encounter: Secondary | ICD-10-CM | POA: Diagnosis not present

## 2022-12-18 DIAGNOSIS — E291 Testicular hypofunction: Secondary | ICD-10-CM | POA: Diagnosis not present

## 2023-01-10 DIAGNOSIS — M6530 Trigger finger, unspecified finger: Secondary | ICD-10-CM | POA: Diagnosis not present

## 2023-01-10 DIAGNOSIS — S99921A Unspecified injury of right foot, initial encounter: Secondary | ICD-10-CM | POA: Diagnosis not present

## 2024-03-09 ENCOUNTER — Encounter: Payer: Self-pay | Admitting: Sports Medicine
